# Patient Record
Sex: Female | Born: 1960 | Race: Black or African American | Hispanic: No | Smoking: Never smoker
Health system: Southern US, Community
[De-identification: ages and names within clinical notes are randomized; demographics above are authoritative.]

## PROBLEM LIST (undated history)

## (undated) DIAGNOSIS — T7840XA Allergy, unspecified, initial encounter: Secondary | ICD-10-CM

## (undated) DIAGNOSIS — E119 Type 2 diabetes mellitus without complications: Secondary | ICD-10-CM

## (undated) DIAGNOSIS — E079 Disorder of thyroid, unspecified: Secondary | ICD-10-CM

## (undated) DIAGNOSIS — I10 Essential (primary) hypertension: Secondary | ICD-10-CM

## (undated) DIAGNOSIS — T8859XA Other complications of anesthesia, initial encounter: Secondary | ICD-10-CM

## (undated) DIAGNOSIS — J189 Pneumonia, unspecified organism: Secondary | ICD-10-CM

## (undated) DIAGNOSIS — J45909 Unspecified asthma, uncomplicated: Secondary | ICD-10-CM

## (undated) DIAGNOSIS — T4145XA Adverse effect of unspecified anesthetic, initial encounter: Secondary | ICD-10-CM

## (undated) HISTORY — DX: Essential (primary) hypertension: I10

## (undated) HISTORY — PX: CARPAL TUNNEL RELEASE: SHX101

## (undated) HISTORY — DX: Unspecified asthma, uncomplicated: J45.909

## (undated) HISTORY — DX: Type 2 diabetes mellitus without complications: E11.9

## (undated) HISTORY — DX: Allergy, unspecified, initial encounter: T78.40XA

## (undated) HISTORY — DX: Disorder of thyroid, unspecified: E07.9

---

## 1992-10-17 HISTORY — PX: TUBAL LIGATION: SHX77

## 1996-10-17 HISTORY — PX: ABDOMINAL HYSTERECTOMY: SHX81

## 2012-02-28 DIAGNOSIS — E559 Vitamin D deficiency, unspecified: Secondary | ICD-10-CM | POA: Insufficient documentation

## 2012-02-28 DIAGNOSIS — E782 Mixed hyperlipidemia: Secondary | ICD-10-CM | POA: Insufficient documentation

## 2012-04-07 DIAGNOSIS — C55 Malignant neoplasm of uterus, part unspecified: Secondary | ICD-10-CM | POA: Insufficient documentation

## 2012-08-08 ENCOUNTER — Emergency Department (HOSPITAL_COMMUNITY)
Admission: EM | Admit: 2012-08-08 | Discharge: 2012-08-08 | Disposition: A | Discharge status: Home / Self Care | Payer: BC Managed Care – PPO | Source: Home / Self Care

## 2012-08-08 ENCOUNTER — Encounter (HOSPITAL_COMMUNITY): Payer: Self-pay

## 2012-08-08 DIAGNOSIS — N390 Urinary tract infection, site not specified: Secondary | ICD-10-CM

## 2012-08-08 DIAGNOSIS — R7309 Other abnormal glucose: Secondary | ICD-10-CM

## 2012-08-08 DIAGNOSIS — E119 Type 2 diabetes mellitus without complications: Secondary | ICD-10-CM

## 2012-08-08 DIAGNOSIS — R739 Hyperglycemia, unspecified: Secondary | ICD-10-CM

## 2012-08-08 DIAGNOSIS — R252 Cramp and spasm: Secondary | ICD-10-CM

## 2012-08-08 HISTORY — DX: Essential (primary) hypertension: I10

## 2012-08-08 HISTORY — DX: Type 2 diabetes mellitus without complications: E11.9

## 2012-08-08 LAB — POCT URINALYSIS DIP (DEVICE)
Glucose, UA: 500 mg/dL — AB
Nitrite: NEGATIVE

## 2012-08-08 LAB — POCT I-STAT, CHEM 8
Calcium, Ion: 1.19 mmol/L (ref 1.12–1.23)
Chloride: 100 mEq/L (ref 96–112)
HCT: 46 % (ref 36.0–46.0)
Potassium: 4.2 mEq/L (ref 3.5–5.1)

## 2012-08-08 MED ORDER — CIPROFLOXACIN HCL 250 MG PO TABS: 250.0000 mg | ORAL_TABLET | Freq: Two times a day (BID) | ORAL | Status: DC

## 2012-08-08 NOTE — ED Notes (Signed)
C/o she has been experiencing UA incontinence past few days w minimal cause, and painful leg cramps; NAD; also c/o perineal area itching; denies any STD concerns

## 2012-08-08 NOTE — ED Provider Notes (Signed)
History     CSN: [id number]  Arrival date & time 08/08/12  0915   None     Chief Complaint  Patient presents with  . Urinary Incontinence    (Consider location/radiation/quality/duration/timing/severity/associated sxs/prior treatment) HPI Comments: 51 year old morbidly obese female complains of urinary incontinence. She complains of urinary frequency and urgency for approximately 2 weeks. He states when she voids or bladder there is a large amount of fluid that she believes he and is also overly thirsty. Denies having dribbling urine or small volume voids. She also complains of muscle cramps in her legs primarily at nighttime. States that she had a physical exam in June and everything was normal. She has a history of diabetes and hypertension in which he takes at forming hand hydrochlorothiazide. She does not have a local primary care provider.   Past Medical History  Diagnosis Date  . Diabetes mellitus without complication   . Hypertension     Past Surgical History  Procedure Date  . Abdominal hysterectomy     History reviewed. No pertinent family history.  History  Substance Use Topics  . Smoking status: Not on file  . Smokeless tobacco: Not on file  . Alcohol Use:     OB History    Grav Para Term Preterm Abortions TAB SAB Ect Mult Living                  Review of Systems  Constitutional: Negative for fever, activity change and fatigue.  HENT: Negative.   Eyes: Negative.   Respiratory: Negative for cough, shortness of breath and wheezing.   Cardiovascular: Negative for chest pain and palpitations.  Gastrointestinal: Negative.   Genitourinary: Positive for urgency and frequency. Negative for dysuria, hematuria, vaginal pain and pelvic pain.  Musculoskeletal: Negative.   Skin: Negative for color change, pallor and rash.  Neurological: Negative.     Allergies  Butyl acetate  Home Medications   Current Outpatient Rx  Name Route Sig Dispense Refill  .  HYDROCHLOROTHIAZIDE 12.5 MG PO CAPS Oral Take 12.5 mg by mouth daily.    Marland Kitchen METFORMIN HCL 500 MG PO TABS Oral Take 500 mg by mouth 2 (two) times daily with a meal.    . CIPROFLOXACIN HCL 250 MG PO TABS Oral Take 1 tablet (250 mg total) by mouth 2 (two) times daily. 10 tablet 0    BP 143/87  Pulse 87  Temp 98.3 F (36.8 C) (Oral)  SpO2 97%  Physical Exam  Constitutional: She is oriented to person, place, and time. She appears well-developed and well-nourished.       Morbidly obese  HENT:  Head: Normocephalic and atraumatic.  Eyes: Conjunctivae normal and EOM are normal.  Neck: Neck supple.  Cardiovascular: Normal rate and regular rhythm.   Pulmonary/Chest: Effort normal and breath sounds normal.  Neurological: She is alert and oriented to person, place, and time.  Skin: Skin is warm and dry.  Psychiatric: She has a normal mood and affect.    ED Course  Procedures (including critical care time)  Labs Reviewed  POCT URINALYSIS DIP (DEVICE) - Abnormal; Notable for the following:    Glucose, UA 500 (*)     Bilirubin Urine SMALL (*)     Ketones, ur >=160 (*)     Hgb urine dipstick MODERATE (*)     Protein, ur 100 (*)     Leukocytes, UA TRACE (*)  Biochemical Testing Only. Please order routine urinalysis from main lab if confirmatory testing is needed.  All other components within normal limits  POCT I-STAT, CHEM 8 - Abnormal; Notable for the following:    Glucose, Bld 351 (*)     Hemoglobin 15.6 (*)     All other components within normal limits   No results found.   1. T2DM (type 2 diabetes mellitus)   2. Hyperglycemia   3. Muscle cramps at night   4. UTI (lower urinary tract infection)       MDM   Results for orders placed during the hospital encounter of 08/08/12  POCT URINALYSIS DIP (DEVICE)      Component Value Range   Glucose, UA 500 (*) NEGATIVE mg/dL   Bilirubin Urine SMALL (*) NEGATIVE   Ketones, ur >=160 (*) NEGATIVE mg/dL   Specific Gravity, Urine  1.020  1.005 - 1.030   Hgb urine dipstick MODERATE (*) NEGATIVE   pH 5.5  5.0 - 8.0   Protein, ur 100 (*) NEGATIVE mg/dL   Urobilinogen, UA 0.2  0.0 - 1.0 mg/dL   Nitrite NEGATIVE  NEGATIVE   Leukocytes, UA TRACE (*) NEGATIVE  POCT I-STAT, CHEM 8      Component Value Range   Sodium 136  135 - 145 mEq/L   Potassium 4.2  3.5 - 5.1 mEq/L   Chloride 100  96 - 112 mEq/L   BUN 15  6 - 23 mg/dL   Creatinine, Ser 1.61  0.50 - 1.10 mg/dL   Glucose, Bld 096 (*) 70 - 99 mg/dL   Calcium, Ion 0.45  4.09 - 1.23 mmol/L   TCO2 23  0 - 100 mmol/L   Hemoglobin 15.6 (*) 12.0 - 15.0 g/dL   HCT 81.1  91.4 - 78.2 %    I suspect most of her symptoms are coming from hyperglycemia polyuria and polydipsia. Given the name of the physician to follow up with you should call today for an appointment. She also has a UTI treated with Cipro 250 twice a day for 5 days. Her only treatment for type 2 diabetes mellitus is metformin 500 mg ER one daily. I recommended she take 2 tablets daily. Instructions for checking the blood sugar frequently and hyperglycemia management.       Hayden Rasmussen, NP 08/08/12 1128

## 2012-08-08 NOTE — ED Provider Notes (Signed)
Medical screening examination/treatment/procedure(s) were performed by non-physician practitioner and as supervising physician I was immediately available for consultation/collaboration.  Shalaine Payson   Thedore Pickel, MD 08/08/12 1246 

## 2015-02-25 ENCOUNTER — Emergency Department (HOSPITAL_COMMUNITY)
Admission: EM | Admit: 2015-02-25 | Discharge: 2015-02-25 | Disposition: A | Discharge status: Home / Self Care | Payer: Managed Care, Other (non HMO) | Attending: Emergency Medicine | Admitting: Emergency Medicine

## 2015-02-25 ENCOUNTER — Encounter (HOSPITAL_COMMUNITY): Payer: Self-pay | Admitting: *Deleted

## 2015-02-25 DIAGNOSIS — I10 Essential (primary) hypertension: Secondary | ICD-10-CM | POA: Insufficient documentation

## 2015-02-25 DIAGNOSIS — Z794 Long term (current) use of insulin: Secondary | ICD-10-CM | POA: Diagnosis not present

## 2015-02-25 DIAGNOSIS — H11422 Conjunctival edema, left eye: Secondary | ICD-10-CM

## 2015-02-25 DIAGNOSIS — Z79899 Other long term (current) drug therapy: Secondary | ICD-10-CM | POA: Insufficient documentation

## 2015-02-25 DIAGNOSIS — E119 Type 2 diabetes mellitus without complications: Secondary | ICD-10-CM | POA: Diagnosis not present

## 2015-02-25 DIAGNOSIS — H578 Other specified disorders of eye and adnexa: Secondary | ICD-10-CM | POA: Diagnosis present

## 2015-02-25 MED ORDER — TETRACAINE HCL 0.5 % OP SOLN
2.0000 [drp] | Freq: Once | OPHTHALMIC | Status: AC
  Administered 2015-02-25: 2 [drp] via OPHTHALMIC
  Filled 2015-02-25: qty 2

## 2015-02-25 MED ORDER — KETOTIFEN FUMARATE 0.025 % OP SOLN: 1.0000 [drp] | Freq: Two times a day (BID) | OPHTHALMIC | Status: DC

## 2015-02-25 MED ORDER — GENTAMICIN SULFATE 0.3 % OP SOLN: 2.0000 [drp] | OPHTHALMIC | Status: DC

## 2015-02-25 MED ORDER — FLUORESCEIN SODIUM 1 MG OP STRP
1.0000 | ORAL_STRIP | Freq: Once | OPHTHALMIC | Status: AC
  Administered 2015-02-25: 1 via OPHTHALMIC
  Filled 2015-02-25: qty 1

## 2015-02-25 NOTE — ED Provider Notes (Signed)
CSN: [id number]     Arrival date & time 02/25/15  1646 History  This chart was scribed for a non-physician practitioner, Margarita Mail, PA-C working with Virgel Manifold, MD by Martinique Peace, ED Scribe. The patient was seen in TR04C/TR04C. The patient's care was started at 5:52 PM.    Chief Complaint  Patient presents with  . Eye Problem      The history is provided by the patient. No language interpreter was used.    HPI Comments: Jessica Pace is a 54 y.o. female who presents to the Emergency Department complaining of new onset of left eye irritation/redness x 3 hours with some blurred vision. Pt reports that she felt like she had something in her eye but was not sure what it was so she asked her friend to examine it for her pta. Blister noted to affected eye. 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 She further denies the use of any new mascaras or exposure to anything that may be causing current symptoms. Pt does not wear contacts. Allergies to Butyl Acetate. History of DM and hypertension. Pt is non-smoker.    Past Medical History  Diagnosis Date  . Diabetes mellitus without complication   . Hypertension    Past Surgical History  Procedure Laterality Date  . Abdominal hysterectomy     No family history on file. History  Substance Use Topics  . Smoking status: Never Smoker   . Smokeless tobacco: Not on file  . Alcohol Use: No   OB History    No data available     Review of Systems  Constitutional: Negative for fever and chills.  Eyes: Positive for pain, redness and visual disturbance.  Gastrointestinal: Negative for nausea and vomiting.      Allergies  Butyl acetate  Home Medications   Prior to Admission medications   Medication Sig Start Date End Date Taking? Authorizing Provider  Cholecalciferol (VITAMIN D-3 PO) Take 1 tablet by mouth See admin instructions. Take on sundays and  wednesdays   Yes Historical Provider, MD  insulin glargine (LANTUS) 100 UNIT/ML injection Inject 30 Units into the skin at bedtime.   Yes Historical Provider, MD  losartan (COZAAR) 25 MG tablet Take 25 mg by mouth daily.   Yes Historical Provider, MD  sitaGLIPtin-metformin (JANUMET) 50-1000 MG per tablet Take 1 tablet by mouth daily.   Yes Historical Provider, MD  ciprofloxacin (CIPRO) 250 MG tablet Take 1 tablet (250 mg total) by mouth 2 (two) times daily. Patient not taking: Reported on 02/25/2015 08/08/12   Janne Napoleon, NP   BP 151/75 mmHg  Pulse 80  Temp(Src) 98.2 F (36.8 C) (Oral)  Resp 20  SpO2 98% Physical Exam  Constitutional: She is oriented to person, place, and time. She appears well-developed and well-nourished. No distress.  HENT:  Head: Normocephalic and atraumatic.  Eyes: EOM are normal. Left eye exhibits chemosis.  Eye pressure- Right: 19, Left: 12  Fluorescence test- No uptake  Neck: Neck supple. No tracheal deviation present.  Cardiovascular: Normal rate.   Pulmonary/Chest: Effort normal. No respiratory distress.  Musculoskeletal: Normal range of motion.  Neurological: She is alert and oriented to person, place, and time.  Skin: Skin is warm and dry.  Psychiatric: She has a normal mood and affect. Her behavior is normal.  Nursing note and vitals reviewed.   ED Course  Procedures (including critical care time) Labs Review Labs Reviewed -  No data to display  Imaging Review No results found.   EKG Interpretation None     Medications  tetracaine (PONTOCAINE) 0.5 % ophthalmic solution 2 drop (not administered)  fluorescein ophthalmic strip 1 strip (not administered)     6:04 PM- Treatment plan was discussed with patient who verbalizes understanding and agrees.   MDM   Final diagnoses:  Chemosis of conjunctiva, left    General unilateral chemosis in the lateral portion of the eye. I suspect this is an allergic versus viral. Patient will be discharged  with Garamycin drops and ketotifen. She has no visual disturbances. She appears safe for discharge at this time, follow-up with ophthalmology.  I personally performed the services described in this documentation, which was scribed in my presence. The recorded information has been reviewed and is accurate.      Margarita Mail, PA-C 02/27/15 1632  Virgel Manifold, MD 02/27/15 [phone]

## 2015-02-25 NOTE — ED Notes (Signed)
The pt is c/o redness and irritation in the lt eye fior 3 hours redness and she has a blkister  Also.  nio previous hx.  lmp none

## 2015-02-25 NOTE — Discharge Instructions (Signed)
Conjunctivitis Conjunctivitis is commonly called "pink eye." Conjunctivitis can be caused by bacterial or viral infection, allergies, or injuries. There is usually redness of the lining of the eye, itching, discomfort, and sometimes discharge. There may be deposits of matter along the eyelids. A viral infection usually causes a watery discharge, while a bacterial infection causes a yellowish, thick discharge. Pink eye is very contagious and spreads by direct contact. You may be given antibiotic eyedrops as part of your treatment. Before using your eye medicine, remove all drainage from the eye by washing gently with warm water and cotton balls. Continue to use the medication until you have awakened 2 mornings in a row without discharge from the eye. Do not rub your eye. This increases the irritation and helps spread infection. Use separate towels from other household members. Wash your hands with soap and water before and after touching your eyes. Use cold compresses to reduce pain and sunglasses to relieve irritation from light. Do not wear contact lenses or wear eye makeup until the infection is gone. SEEK MEDICAL CARE IF:  Your symptoms are not better after 3 days of treatment. You have increased pain or trouble seeing. The outer eyelids become very red or swollen. Document Released: 11/10/2004 Document Revised: 12/26/2011 Document Reviewed: 10/03/2005 [facility] Patient Information 2015 [place], [place]. This information is not intended to replace advice given to you by your health care provider. Make sure you discuss any questions you have with your health care provider. Allergic Conjunctivitis The conjunctiva is a thin membrane that covers the visible white part of the eyeball and the underside of the eyelids. This membrane protects and lubricates the eye. The membrane has small blood vessels running through it that can normally be seen. When the conjunctiva becomes inflamed, the condition is called  conjunctivitis. In response to the inflammation, the conjunctival blood vessels become swollen. The swelling results in redness in the normally white part of the eye. The blood vessels of this membrane also react when a person has allergies and is then called allergic conjunctivitis. This condition usually lasts for as long as the allergy persists. Allergic conjunctivitis cannot be passed to another person (non-contagious). The likelihood of bacterial infection is great and the cause is not likely due to allergies if the inflamed eye has:  A sticky discharge.  Discharge or sticking together of the lids in the morning.  Scaling or flaking of the eyelids where the eyelashes come out.  Red swollen eyelids. CAUSES   Viruses.  Irritants such as foreign bodies.  Chemicals.  General allergic reactions.  Inflammation or serious diseases in the inside or the outside of the eye or the orbit (the boney cavity in which the eye sits) can cause a "red eye." SYMPTOMS   Eye redness.  Tearing.  Itchy eyes.  Burning feeling in the eyes.  Clear drainage from the eye.  Allergic reaction due to pollens or ragweed sensitivity. Seasonal allergic conjunctivitis is frequent in the spring when pollens are in the air and in the fall. DIAGNOSIS  This condition, in its many forms, is usually diagnosed based on the history and an ophthalmological exam. It usually involves both eyes. If your eyes react at the same time every year, allergies may be the cause. While most "red eyes" are due to allergy or an infection, the role of an eye (ophthalmological) exam is important. The exam can rule out serious diseases of the eye or orbit. TREATMENT   Non-antibiotic eye drops, ointments, or medications by  mouth may be prescribed if the ophthalmologist is sure the conjunctivitis is due to allergies alone.  Over-the-counter drops and ointments for allergic symptoms should be used only after other causes of  conjunctivitis have been ruled out, or as your caregiver suggests. Medications by mouth are often prescribed if other allergy-related symptoms are present. If the ophthalmologist is sure that the conjunctivitis is due to allergies alone, treatment is normally limited to drops or ointments to reduce itching and burning. HOME CARE INSTRUCTIONS   Wash hands before and after applying drops or ointments, or touching the inflamed eye(s) or eyelids.  Do not let the eye dropper tip or ointment tube touch the eyelid when putting medicine in your eye.  Stop using your soft contact lenses and throw them away. Use a new pair of lenses when recovery is complete. You should run through sterilizing cycles at least three times before use after complete recovery if the old soft contact lenses are to be used. Hard contact lenses should be stopped. They need to be thoroughly sterilized before use after recovery.  Itching and burning eyes due to allergies is often relieved by using a cool cloth applied to closed eye(s). SEEK MEDICAL CARE IF:   Your problems do not go away after two or three days of treatment.  Your lids are sticky (especially in the morning when you wake up) or stick together.  Discharge develops. Antibiotics may be needed either as drops, ointment, or by mouth.  You have extreme light sensitivity.  An oral temperature above 102 F (38.9 C) develops.  Pain in or around the eye or any other visual symptom develops. MAKE SURE YOU:   Understand these instructions.  Will watch your condition.  Will get help right away if you are not doing well or get worse. Document Released: 12/24/2002 Document Revised: 12/26/2011 Document Reviewed: 11/19/2007 [facility] Patient Information 2015 [place], [place]. This information is not intended to replace advice given to you by your health care provider. Make sure you discuss any questions you have with your health care provider.

## 2015-02-26 LAB — CBG MONITORING, ED: Glucose-Capillary: 152 mg/dL — ABNORMAL HIGH (ref 65–99)

## 2015-07-26 ENCOUNTER — Emergency Department (HOSPITAL_COMMUNITY): Payer: Managed Care, Other (non HMO)

## 2015-07-26 ENCOUNTER — Emergency Department (HOSPITAL_COMMUNITY)
Admission: EM | Admit: 2015-07-26 | Discharge: 2015-07-27 | Disposition: A | Discharge status: Home / Self Care | Payer: Managed Care, Other (non HMO) | Attending: Emergency Medicine | Admitting: Emergency Medicine

## 2015-07-26 ENCOUNTER — Encounter (HOSPITAL_COMMUNITY): Payer: Self-pay | Admitting: Emergency Medicine

## 2015-07-26 DIAGNOSIS — E1165 Type 2 diabetes mellitus with hyperglycemia: Secondary | ICD-10-CM | POA: Diagnosis not present

## 2015-07-26 DIAGNOSIS — Z79899 Other long term (current) drug therapy: Secondary | ICD-10-CM | POA: Insufficient documentation

## 2015-07-26 DIAGNOSIS — I10 Essential (primary) hypertension: Secondary | ICD-10-CM | POA: Insufficient documentation

## 2015-07-26 DIAGNOSIS — R739 Hyperglycemia, unspecified: Secondary | ICD-10-CM

## 2015-07-26 LAB — BASIC METABOLIC PANEL
ANION GAP: 9 (ref 5–15)
BUN: 12 mg/dL (ref 6–20)
CALCIUM: 9.1 mg/dL (ref 8.9–10.3)
CO2: 24 mmol/L (ref 22–32)
CREATININE: 0.69 mg/dL (ref 0.44–1.00)
Chloride: 97 mmol/L — ABNORMAL LOW (ref 101–111)
GLUCOSE: 327 mg/dL — AB (ref 65–99)
Potassium: 4 mmol/L (ref 3.5–5.1)
Sodium: 130 mmol/L — ABNORMAL LOW (ref 135–145)

## 2015-07-26 LAB — CBG MONITORING, ED
GLUCOSE-CAPILLARY: 352 mg/dL — AB (ref 65–99)
GLUCOSE-CAPILLARY: 278 mg/dL — AB (ref 65–99)

## 2015-07-26 LAB — URINE MICROSCOPIC-ADD ON

## 2015-07-26 LAB — URINALYSIS, ROUTINE W REFLEX MICROSCOPIC
BILIRUBIN URINE: NEGATIVE
KETONES UR: NEGATIVE mg/dL
Nitrite: NEGATIVE
PROTEIN: NEGATIVE mg/dL
Specific Gravity, Urine: >1.03 — ABNORMAL HIGH (ref 1.005–1.030)
UROBILINOGEN UA: 0.2 mg/dL (ref 0.0–1.0)
pH: 5.5 (ref 5.0–8.0)

## 2015-07-26 LAB — CBC
HCT: 34.1 % — ABNORMAL LOW (ref 36.0–46.0)
HEMOGLOBIN: 11.7 g/dL — AB (ref 12.0–15.0)
MCH: 28.3 pg (ref 26.0–34.0)
MCHC: 34.3 g/dL (ref 30.0–36.0)
MCV: 82.6 fL (ref 78.0–100.0)
PLATELETS: 184 10*3/uL (ref 150–400)
RBC: 4.13 MIL/uL (ref 3.87–5.11)
RDW: 15.2 % (ref 11.5–15.5)
WBC: 5.8 10*3/uL (ref 4.0–10.5)

## 2015-07-26 MED ORDER — SODIUM CHLORIDE 0.9 % IV BOLUS (SEPSIS)
1000.0000 mL | Freq: Once | INTRAVENOUS | Status: AC
  Administered 2015-07-26: 1000 mL via INTRAVENOUS

## 2015-07-26 MED ORDER — FLUCONAZOLE 100 MG PO TABS
150.0000 mg | ORAL_TABLET | Freq: Once | ORAL | Status: AC
  Administered 2015-07-26: 150 mg via ORAL
  Filled 2015-07-26: qty 2

## 2015-07-26 NOTE — ED Provider Notes (Signed)
CSN: [id number]     Arrival date & time 07/26/15  2011 History   First MD Initiated Contact with Patient 07/26/15 2118     Chief Complaint  Patient presents with  . Hyperglycemia     (Consider location/radiation/quality/duration/timing/severity/associated sxs/prior Treatment) HPI Comments: The pt is a diabetic - she has recently been treated for bronchitis with doxycycline, has been using metformin for the last 2 months and has generally had good blood sugar control around 130 in the morning. She denies fevers chills, cough has improved, has urinary frequency and polydipsia. She denies ongoing diarrhea or blood in the stools but has had some loose stools last week. She has no abdominal tenderness, no swelling, no rashes, no headaches. The patient reports elevated blood sugars over the last 2 days, 300, today over 400, now associated generalized fatigue, is feeling lightheaded and dizzy but has no shortness of breath. The symptoms are persistent, gradually worsening. She does not take insulin. She does report some vaginal itching  Patient is a 54 y.o. female presenting with hyperglycemia. The history is provided by the patient.  Hyperglycemia   Past Medical History  Diagnosis Date  . Diabetes mellitus without complication ([place])   . Hypertension    Past Surgical History  Procedure Laterality Date  . Abdominal hysterectomy     No family history on file. Social History  Substance Use Topics  . Smoking status: Never Smoker   . Smokeless tobacco: None  . Alcohol Use: No   OB History    No data available     Review of Systems  All other systems reviewed and are negative.     Allergies  Butyl acetate  Home Medications   Prior to Admission medications   Medication Sig Start Date End Date Taking? Authorizing Provider  Cholecalciferol (VITAMIN D-3 PO) Take 1 tablet by mouth See admin instructions. Take on sundays and wednesdays   Yes Historical Provider, MD  ergocalciferol  (VITAMIN D2) 50000 UNITS capsule Take 50,000 Units by mouth See admin instructions. Take one capsule by mouth twice a week on sundays and thursdays   Yes Historical Provider, MD  losartan (COZAAR) 25 MG tablet Take 25 mg by mouth daily.   Yes Historical Provider, MD  metFORMIN (GLUMETZA) 500 MG (MOD) 24 hr tablet Take 1,000 mg by mouth daily with breakfast.   Yes Historical Provider, MD  ciprofloxacin (CIPRO) 250 MG tablet Take 1 tablet (250 mg total) by mouth 2 (two) times daily. Patient not taking: Reported on 02/25/2015 08/08/12   Janne Napoleon, NP  gentamicin (GARAMYCIN) 0.3 % ophthalmic solution Place 2 drops into the left eye every 4 (four) hours. Patient not taking: Reported on 07/26/2015 02/25/15   Margarita Mail, PA-C  ketotifen (ZADITOR) 0.025 % ophthalmic solution Place 1 drop into the left eye 2 (two) times daily. Patient not taking: Reported on 07/26/2015 02/25/15   Margarita Mail, PA-C   BP 116/54 mmHg  Pulse 67  Temp(Src) 98.8 F (37.1 C) (Oral)  Resp 16  Ht 5\' 4"  (1.626 m)  Wt 222 lb 12.8 oz (101.061 kg)  BMI 38.22 kg/m2  SpO2 97% Physical Exam  Constitutional: She appears well-developed and well-nourished. No distress.  HENT:  Head: Normocephalic and atraumatic.  Mouth/Throat: Oropharynx is clear and moist. No oropharyngeal exudate.  Eyes: Conjunctivae and EOM are normal. Pupils are equal, round, and reactive to light. Right eye exhibits no discharge. Left eye exhibits no discharge. No scleral icterus.  Neck: Normal range of motion. Neck supple.  No JVD present. No thyromegaly present.  Cardiovascular: Normal rate, regular rhythm, normal heart sounds and intact distal pulses.  Exam reveals no gallop and no friction rub.   No murmur heard. Pulmonary/Chest: Effort normal and breath sounds normal. No respiratory distress. She has no wheezes. She has no rales.  Abdominal: Soft. Bowel sounds are normal. She exhibits no distension and no mass. There is no tenderness.  Musculoskeletal:  Normal range of motion. She exhibits no edema or tenderness.  Lymphadenopathy:    She has no cervical adenopathy.  Neurological: She is alert. Coordination normal.  Skin: Skin is warm and dry. No rash noted. No erythema.  Psychiatric: She has a normal mood and affect. Her behavior is normal.  Nursing note and vitals reviewed.   ED Course  Procedures (including critical care time) Labs Review Labs Reviewed  BASIC METABOLIC PANEL - Abnormal; Notable for the following:    Sodium 130 (*)    Chloride 97 (*)    Glucose, Bld 327 (*)    All other components within normal limits  CBC - Abnormal; Notable for the following:    Hemoglobin 11.7 (*)    HCT 34.1 (*)    All other components within normal limits  URINALYSIS, ROUTINE W REFLEX MICROSCOPIC (NOT AT [facility]) - Abnormal; Notable for the following:    APPearance CLOUDY (*)    Specific Gravity, Urine >1.030 (*)    Glucose, UA >1000 (*)    Hgb urine dipstick TRACE (*)    Leukocytes, UA TRACE (*)    All other components within normal limits  CBG MONITORING, ED - Abnormal; Notable for the following:    Glucose-Capillary 352 (*)    All other components within normal limits  CBG MONITORING, ED - Abnormal; Notable for the following:    Glucose-Capillary 278 (*)    All other components within normal limits  URINE MICROSCOPIC-ADD ON    Imaging Review Dg Chest 2 View  07/26/2015   CLINICAL DATA:  54 year old female with hyperglycemia, dizziness, weakness. Initial encounter.  EXAM: CHEST  2 VIEW  COMPARISON:  None.  FINDINGS: Large body habitus. Low lung volumes. Normal cardiac size and mediastinal contours. Visualized tracheal air column is within normal limits. No pneumothorax, pulmonary edema, pleural effusion or confluent pulmonary opacity. No acute osseous abnormality identified.  IMPRESSION: Low lung volumes, otherwise no acute cardiopulmonary abnormality.   Electronically Signed   By: Genevie Ann M.D.   On: 07/26/2015 22:46   I have  personally reviewed and evaluated these images and lab results as part of my medical decision-making.   MDM   Final diagnoses:  Hyperglycemia    Vital signs are unremarkable, urinalysis shows no ketones, she does have a high specific gravity consistent with being dehydrated. We will give IV fluids, rule out pneumonia, she does describe vaginal itching which could be consistent with yeast infection since her blood sugar is so high and this happens to her when she gets high blood sugar.  No DKA - pt informed of results - she is stable for d/c.  Meds given in ED:  Medications  sodium chloride 0.9 % bolus 1,000 mL (0 mLs Intravenous Stopped 07/26/15 2244)  sodium chloride 0.9 % bolus 1,000 mL (1,000 mLs Intravenous New Bag/Given 07/26/15 2252)  fluconazole (DIFLUCAN) tablet 150 mg (150 mg Oral Given 07/26/15 2252)    New Prescriptions   No medications on file      Noemi Chapel, MD 07/26/15 2336

## 2015-07-26 NOTE — ED Notes (Signed)
Pt. reports elevated blood sugar this evening = 421 with  mild dizziness and generalized weakness/fatigue .

## 2015-07-26 NOTE — ED Notes (Signed)
Pt placed in a gown and hooked up to the monitor with the BP cuff and pulse ox 

## 2015-07-26 NOTE — Discharge Instructions (Signed)
You will likely need further medication management of your elevated blood sugar - through hydration we have been able to get the blood sugar down into a reasonable range - you MUST keep to a strict diabetic diet  Return to the ER for worsening symptoms.  Please obtain all of your results from medical records or have your doctors office obtain the results - share them with your doctor - you should be seen at your doctors office in the next 2 days. Call today to arrange your follow up. Take the medications as prescribed. Please review all of the medicines and only take them if you do not have an allergy to them. Please be aware that if you are taking birth control pills, taking other prescriptions, ESPECIALLY ANTIBIOTICS may make the birth control ineffective - if this is the case, either do not engage in sexual activity or use alternative methods of birth control such as condoms until you have finished the medicine and your family doctor says it is OK to restart them. If you are on a blood thinner such as COUMADIN, be aware that any other medicine that you take may cause the coumadin to either work too much, or not enough - you should have your coumadin level rechecked in next 7 days if this is the case.  ?  It is also a possibility that you have an allergic reaction to any of the medicines that you have been prescribed - Everybody reacts differently to medications and while MOST people have no trouble with most medicines, you may have a reaction such as nausea, vomiting, rash, swelling, shortness of breath. If this is the case, please stop taking the medicine immediately and contact your physician.  ?  You should return to the ER if you develop severe or worsening symptoms.

## 2015-07-26 NOTE — ED Notes (Signed)
Pt CBG result was 278. Informed RN.

## 2015-07-27 MED ORDER — ACETAMINOPHEN 500 MG PO TABS
1000.0000 mg | ORAL_TABLET | Freq: Once | ORAL | Status: AC
Start: 2015-07-27 — End: 2015-07-27
  Administered 2015-07-27: 1000 mg via ORAL
  Filled 2015-07-27: qty 2

## 2015-07-27 NOTE — ED Notes (Signed)
Pt A&Ox4, ambulatory at d/c with steady gait but wheeled out of ED via wheelchair, NAD and pt states she has all of her belongings with her at discharge.

## 2015-07-28 ENCOUNTER — Telehealth: Payer: Self-pay | Admitting: Family Medicine

## 2015-07-28 ENCOUNTER — Ambulatory Visit (INDEPENDENT_AMBULATORY_CARE_PROVIDER_SITE_OTHER): Payer: Managed Care, Other (non HMO) | Admitting: Family Medicine

## 2015-07-28 ENCOUNTER — Encounter: Payer: Self-pay | Admitting: Family Medicine

## 2015-07-28 VITALS — BP 128/74 | HR 68 | Ht 64.5 in | Wt 219.2 lb

## 2015-07-28 DIAGNOSIS — E669 Obesity, unspecified: Secondary | ICD-10-CM

## 2015-07-28 DIAGNOSIS — Z1239 Encounter for other screening for malignant neoplasm of breast: Secondary | ICD-10-CM

## 2015-07-28 DIAGNOSIS — Z7189 Other specified counseling: Secondary | ICD-10-CM | POA: Diagnosis not present

## 2015-07-28 DIAGNOSIS — Z7689 Persons encountering health services in other specified circumstances: Secondary | ICD-10-CM

## 2015-07-28 DIAGNOSIS — E119 Type 2 diabetes mellitus without complications: Secondary | ICD-10-CM

## 2015-07-28 DIAGNOSIS — I10 Essential (primary) hypertension: Secondary | ICD-10-CM

## 2015-07-28 DIAGNOSIS — Z23 Encounter for immunization: Secondary | ICD-10-CM | POA: Diagnosis not present

## 2015-07-28 DIAGNOSIS — E1169 Type 2 diabetes mellitus with other specified complication: Secondary | ICD-10-CM

## 2015-07-28 MED ORDER — DULAGLUTIDE 0.75 MG/0.5ML ~~LOC~~ SOAJ: 1.0000 "application " | SUBCUTANEOUS | Status: DC

## 2015-07-28 MED ORDER — METFORMIN HCL ER 500 MG PO TB24: 500.0000 mg | ORAL_TABLET | Freq: Two times a day (BID) | ORAL | Status: DC

## 2015-07-28 NOTE — Progress Notes (Signed)
Subjective:    Patient ID: Jessica Pace, female    DOB: 1960-10-28, 53 y.o.   MRN: [id number]  HPI She is new to the practice and here to establish primary care. She states she has been unhappy with her previous primary care office and decided to switch to [place]. She also reports having to go to the emergency department a few days ago for elevated blood sugar.  States she was diagnosed with diabetes type 2 in 2013. Reports A1C then was over 11 then and has been in the 6 range however she cannot recall when her last A1C was.  Was taking Janumet in past but her previous provider switched her to Metformin due to insurance issues. She states she has tried a class of medication for her diabetes in past that made her urinate a lot and caused her to have yeast infections. She is walking a lot at work and drinking plenty of water.  History of high blood pressure and is taking her medication daily for this.  She would like to see a nutritionist, has never seen one.   she denies fever, chills, fatigue, nausea, vomiting , diarrhea, chest pain, DOE.   She is taking Metformin 500mg  in morning and in evening.  Has not been checking blood sugars on regular basis.   Saw eye doctor 2 weeks ago. Wears glasses.  Feet- no problems  Needs mammogram, last one 2 years ago and normal.  Colonoscopy 2009 and normal per patient.   Need records  Including immunizations.    Review of Systems Pertinent positives and negatives in the history of present illness.    Objective:   Physical Exam  Constitutional: She is oriented to person, place, and time. She appears well-developed and well-nourished. No distress.  Neurological: She is alert and oriented to person, place, and time. Coordination normal.  Skin: Skin is warm and dry. No pallor.  Psychiatric: She has a normal mood and affect. Her behavior is normal. Judgment and thought content normal.   BP 128/74 mmHg  Pulse 68  Ht 5' 4.5" (1.638 m)  Wt 219 lb 3.2 oz  (99.428 kg)  BMI 37.06 kg/m2   hemoglobin A1c 8.7     Assessment & Plan:  Encounter to establish care  Diabetes mellitus type 2 in obese ([place]) - Plan: Amb ref to Medical Nutrition Therapy-MNT  Screening for breast cancer - Plan: MM DIGITAL SCREENING BILATERAL  Need for influenza vaccination - Plan: Flu Vaccine QUAD 36+ mos IM  Obesity (BMI 30-39.9) - Plan: Amb ref to Medical Nutrition Therapy-MNT  Essential hypertension  Discussed diabetes and the importance of checking her blood sugar and good medication compliance. Also discussed exercise and recommended 150 minutes of some sort of physical activity per week along with portion control and carb counting. Since she has never seen a nutritionist or diabetes educator I will refer her for this. Her A1c is 8.7 today and she will need a change to her medication regimen. Change Metformin 500 to extended release BID. Will start her on weekly injection Trulicity, she is ok with weekly injections. Also discussed possibility of weight loss associated with medication.  Discussed that I would like for her to bring a diary of her watch sugars, she is to check them daily before breakfast and 2 hours post meal at some point throughout the day.  Hypertension- doing well on current medication, no changes.  Order placed for screening mammogram. Flu shot given today A minimum of 45 minutes spent  in counseling and coordination of care.

## 2015-07-28 NOTE — Patient Instructions (Signed)
Check your blood sugar daily in the morning before breakfast. Then check it 2 hours after a meal. Keep a diary of your readings.    Basic Carbohydrate Counting for Diabetes Mellitus Carbohydrate counting is a method for keeping track of the amount of carbohydrates you eat. Eating carbohydrates naturally increases the level of sugar (glucose) in your blood, so it is important for you to know the amount that is okay for you to have in every meal. Carbohydrate counting helps keep the level of glucose in your blood within normal limits. The amount of carbohydrates allowed is different for every person. A dietitian can help you calculate the amount that is right for you. Once you know the amount of carbohydrates you can have, you can count the carbohydrates in the foods you want to eat. Carbohydrates are found in the following foods:  Grains, such as breads and cereals.  Dried beans and soy products.  Starchy vegetables, such as potatoes, peas, and corn.  Fruit and fruit juices.  Milk and yogurt.  Sweets and snack foods, such as cake, cookies, candy, chips, soft drinks, and fruit drinks. CARBOHYDRATE COUNTING There are two ways to count the carbohydrates in your food. You can use either of the methods or a combination of both. Reading the "Nutrition Facts" on [place] The "Nutrition Facts" is an area that is included on the labels of almost all packaged food and beverages in the [place]. It includes the serving size of that food or beverage and information about the nutrients in each serving of the food, including the grams (g) of carbohydrate per serving.  Decide the number of servings of this food or beverage that you will be able to eat or drink. Multiply that number of servings by the number of grams of carbohydrate that is listed on the label for that serving. The total will be the amount of carbohydrates you will be having when you eat or drink this food or beverage. Learning  Standard Serving Sizes of Food When you eat food that is not packaged or does not include "Nutrition Facts" on the label, you need to measure the servings in order to count the amount of carbohydrates.A serving of most carbohydrate-rich foods contains about 15 g of carbohydrates. The following list includes serving sizes of carbohydrate-rich foods that provide 15 g ofcarbohydrate per serving:   1 slice of bread (1 oz) or 1 six-inch tortilla.    of a hamburger bun or English muffin.  4-6 crackers.   cup unsweetened dry cereal.    cup hot cereal.   cup rice or pasta.    cup mashed potatoes or  of a large baked potato.  1 cup fresh fruit or one small piece of fruit.    cup canned or frozen fruit or fruit juice.  1 cup milk.   cup plain fat-free yogurt or yogurt sweetened with artificial sweeteners.   cup cooked dried beans or starchy vegetable, such as peas, corn, or potatoes.  Decide the number of standard-size servings that you will eat. Multiply that number of servings by 15 (the grams of carbohydrates in that serving). For example, if you eat 2 cups of strawberries, you will have eaten 2 servings and 30 g of carbohydrates (2 servings x 15 g = 30 g). For foods such as soups and casseroles, in which more than one food is mixed in, you will need to count the carbohydrates in each food that is included. EXAMPLE OF CARBOHYDRATE COUNTING Sample  Dinner  3 oz chicken breast.   cup of brown rice.   cup of corn.  1 cup milk.   1 cup strawberries with sugar-free whipped topping.  Carbohydrate Calculation Step 1: Identify the foods that contain carbohydrates:   Rice.   Corn.   Milk.   Strawberries. Step 2:Calculate the number of servings eaten of each:   2 servings of rice.   1 serving of corn.   1 serving of milk.   1 serving of strawberries. Step 3: Multiply each of those number of servings by 15 g:   2 servings of rice x 15 g = 30 g.    1 serving of corn x 15 g = 15 g.   1 serving of milk x 15 g = 15 g.   1 serving of strawberries x 15 g = 15 g. Step 4: Add together all of the amounts to find the total grams of carbohydrates eaten: 30 g + 15 g + 15 g + 15 g = 75 g.   This information is not intended to replace advice given to you by your health care provider. Make sure you discuss any questions you have with your health care provider.   Document Released: 10/03/2005 Document Revised: 10/24/2014 Document Reviewed: 08/30/2013 Elsevier Interactive Patient Education [employer].

## 2015-07-28 NOTE — Telephone Encounter (Signed)
Pt was notified of taking meds and a saving card and info about weekly shot will be left upfront for her to come pick up. She has never had pancreatitis nor family history of it.

## 2015-07-28 NOTE — Telephone Encounter (Signed)
Please call and tell her that I called in medications to her pharmacy for her diabetes. I am switching her from her current Metformin to an extended release Metformin twice daily. Also, I am prescribing a once weekly injection called Trulicity. See if she can come by the office and pick up the brochure on this medication and the coupon for it. She should try to take the medicine at the same time each week and preferably in the evening.   **Also, please ask her if she has ever had pancreatitis or if she has a family history of it. If so, let me know and I will switch her to a different medication.  Let her know she may have some nausea after starting the medication but this should improve over time. If not, let me know.

## 2015-07-29 ENCOUNTER — Telehealth: Payer: Self-pay

## 2015-07-29 NOTE — Telephone Encounter (Signed)
Medical Records received on 07/29/15

## 2015-08-06 ENCOUNTER — Ambulatory Visit (INDEPENDENT_AMBULATORY_CARE_PROVIDER_SITE_OTHER): Payer: Managed Care, Other (non HMO) | Admitting: Family Medicine

## 2015-08-06 ENCOUNTER — Telehealth: Payer: Self-pay | Admitting: Family Medicine

## 2015-08-06 ENCOUNTER — Encounter: Payer: Self-pay | Admitting: Family Medicine

## 2015-08-06 VITALS — BP 124/78 | HR 68 | Wt 219.0 lb

## 2015-08-06 DIAGNOSIS — E669 Obesity, unspecified: Secondary | ICD-10-CM

## 2015-08-06 DIAGNOSIS — E119 Type 2 diabetes mellitus without complications: Secondary | ICD-10-CM

## 2015-08-06 DIAGNOSIS — E1169 Type 2 diabetes mellitus with other specified complication: Secondary | ICD-10-CM

## 2015-08-06 LAB — POCT GLYCOSYLATED HEMOGLOBIN (HGB A1C)

## 2015-08-06 LAB — GLUCOSE, POCT (MANUAL RESULT ENTRY): POC GLUCOSE: 265 mg/dL — AB (ref 70–99)

## 2015-08-06 MED ORDER — INSULIN GLARGINE 100 UNIT/ML ~~LOC~~ SOLN: 10.0000 [IU] | Freq: Every day | SUBCUTANEOUS | Status: DC

## 2015-08-06 NOTE — Progress Notes (Signed)
   Subjective:    Patient ID: Mardelle Pandolfi, female    DOB: [birth year], 54 y.o.   MRN: [id number]  HPI She is here for complaints of elevated blood sugar in the 300s and also reports blurred vision. Denies headache, dizziness, polyuria, polyphagia, polydipsia. She states she is wearing her old eyeglasses and this could play a role in vision seeming blurry.  States fasting cbg this morning was 334. States she is eating healthy and trying to eat low carbs and has been walking some.  Has appointment with nutritionist November 3rd.  Reports she is taking Metformin bid and Trulicity every Wednesday evening and that yesterday was her 2nd dose of this medication. She is concerned that her blood sugars are still high.     Review of Systems Pertinent positives and negatives in the HPI.     Objective:   Physical Exam  Alert and oriented and in no acute distress.   Visual acuity normal.  Blood glucose finger stick 265, patient's glucometer was 253.     Assessment & Plan:  Diabetes mellitus type 2 in obese ([place]) - Plan: Visual acuity screening, POCT glucose (manual entry), insulin glargine (LANTUS) 100 UNIT/ML injection  Discussed that she should continue current daily bid metformin and  Weekly Trulicity injection. Will add Lantus 10 units nightly and she will continue to check her blood sugar every morning and once later in the day 2 hours after a meal. Also discussed that if she is going to exercise that she should check her sugar before and if less than 100 she should have a small snack prior to exercise. Advised her to call the office on Monday, 4 days from now, and let me know what her blood sugars are and how she is doing. She states she will go to Nutritionist appointment.

## 2015-08-06 NOTE — Telephone Encounter (Signed)
Pt coming in today to bee seen

## 2015-08-06 NOTE — Telephone Encounter (Signed)
Left message for pt to call me back 

## 2015-08-06 NOTE — Telephone Encounter (Signed)
Please call and ask her how exactly is she taking her medication and when? How many doses has she taken so far of Trulicity injection?  Ask her to come by for a nurse visit and to bring her meter and compare it with ours and then she will need to bring her Trulicity pen and demonstrate to you that she is using it properly.  We added medication to what she was on so I expect to see her numbers going down.

## 2015-08-06 NOTE — Telephone Encounter (Signed)
Pt says that blood sugars have been in the 300's and pt is concerned. She states that meds were changed and blood sugars have been like this since then. Last night at bed time it was 343, this morning 334, yesterday morning 365

## 2015-08-06 NOTE — Addendum Note (Signed)
Addended by: Minette Headland A on: 08/06/2015 02:10 PM   Modules accepted: Orders

## 2015-08-10 ENCOUNTER — Encounter: Payer: Self-pay | Admitting: Family Medicine

## 2015-08-10 ENCOUNTER — Telehealth: Payer: Self-pay | Admitting: Internal Medicine

## 2015-08-10 DIAGNOSIS — E1165 Type 2 diabetes mellitus with hyperglycemia: Secondary | ICD-10-CM | POA: Insufficient documentation

## 2015-08-10 DIAGNOSIS — Z8639 Personal history of other endocrine, nutritional and metabolic disease: Secondary | ICD-10-CM | POA: Insufficient documentation

## 2015-08-10 DIAGNOSIS — IMO0002 Reserved for concepts with insufficient information to code with codable children: Secondary | ICD-10-CM | POA: Insufficient documentation

## 2015-08-10 DIAGNOSIS — E669 Obesity, unspecified: Secondary | ICD-10-CM | POA: Insufficient documentation

## 2015-08-10 DIAGNOSIS — E1169 Type 2 diabetes mellitus with other specified complication: Secondary | ICD-10-CM

## 2015-08-10 DIAGNOSIS — Z87898 Personal history of other specified conditions: Secondary | ICD-10-CM | POA: Insufficient documentation

## 2015-08-10 DIAGNOSIS — E785 Hyperlipidemia, unspecified: Secondary | ICD-10-CM

## 2015-08-10 DIAGNOSIS — I10 Essential (primary) hypertension: Secondary | ICD-10-CM | POA: Insufficient documentation

## 2015-08-10 MED ORDER — INSULIN GLARGINE 100 UNIT/ML ~~LOC~~ SOLN: SUBCUTANEOUS | Status: DC

## 2015-08-10 MED ORDER — METFORMIN HCL ER (MOD) 1000 MG PO TB24: 1000.0000 mg | ORAL_TABLET | Freq: Two times a day (BID) | ORAL | Status: DC

## 2015-08-10 NOTE — Telephone Encounter (Signed)
Pt was notified and will call us back on Friday to let us know what her readings are

## 2015-08-10 NOTE — Telephone Encounter (Signed)
Pt called states what her blood sugars are over the weekend.   Thursday night @ 294. Took 10 units of insulin  Friday Am- 288           [place] Before dinner- 318. Took 10 units of insulin before bed  Saturday Am-296 Before Dinner 336  (had veggie omelette, salad and had some meatballs)  took 10 units before bed  Sunday am 279 (had eggs and kiwi) Before dinner- 342 (rotessari chicken and turip greens and few green peas)  took 10 units before bed  Monday am- 266

## 2015-08-10 NOTE — Telephone Encounter (Signed)
Please call and let her know that she is doing the right things but this may take a little more time to get her blood sugar down. Is she having any symptoms?  Tell her to increase her Metformin dose to 1000 mg twice daily and I will send a prescription in to her pharmacy but she can take the medication she has at home until it runs out.  Also, tell her to increase her Lantus to 12 units instead of 10 for the next two nights and if her blood sugar is not below 120 then she can increase it to 14 units on Wednesday night and Thursday night. Have her call call me on Friday and let me know what her numbers are from the week.  Samples given last Thursday 08/06/2015 at her last visit.

## 2015-08-18 ENCOUNTER — Telehealth: Payer: Self-pay

## 2015-08-18 ENCOUNTER — Ambulatory Visit: Payer: Managed Care, Other (non HMO)

## 2015-08-18 NOTE — Telephone Encounter (Signed)
Medical Records received on 08/18/15

## 2015-08-20 ENCOUNTER — Ambulatory Visit: Payer: Managed Care, Other (non HMO) | Admitting: *Deleted

## 2015-08-25 ENCOUNTER — Ambulatory Visit: Payer: Managed Care, Other (non HMO)

## 2015-09-01 ENCOUNTER — Ambulatory Visit: Payer: Managed Care, Other (non HMO)

## 2015-09-01 ENCOUNTER — Encounter: Payer: Self-pay | Admitting: Family Medicine

## 2015-09-01 ENCOUNTER — Ambulatory Visit (INDEPENDENT_AMBULATORY_CARE_PROVIDER_SITE_OTHER): Payer: Managed Care, Other (non HMO) | Admitting: Family Medicine

## 2015-09-01 VITALS — BP 128/80 | HR 68 | Wt 218.2 lb

## 2015-09-01 DIAGNOSIS — E785 Hyperlipidemia, unspecified: Secondary | ICD-10-CM

## 2015-09-01 DIAGNOSIS — IMO0001 Reserved for inherently not codable concepts without codable children: Secondary | ICD-10-CM

## 2015-09-01 DIAGNOSIS — E669 Obesity, unspecified: Secondary | ICD-10-CM | POA: Diagnosis not present

## 2015-09-01 DIAGNOSIS — Z794 Long term (current) use of insulin: Secondary | ICD-10-CM | POA: Diagnosis not present

## 2015-09-01 DIAGNOSIS — E1165 Type 2 diabetes mellitus with hyperglycemia: Secondary | ICD-10-CM | POA: Diagnosis not present

## 2015-09-01 DIAGNOSIS — I10 Essential (primary) hypertension: Secondary | ICD-10-CM | POA: Diagnosis not present

## 2015-09-01 DIAGNOSIS — Z8639 Personal history of other endocrine, nutritional and metabolic disease: Secondary | ICD-10-CM | POA: Insufficient documentation

## 2015-09-01 DIAGNOSIS — E1169 Type 2 diabetes mellitus with other specified complication: Secondary | ICD-10-CM

## 2015-09-01 LAB — POCT URINALYSIS DIPSTICK
Bilirubin, UA: NEGATIVE
Blood, UA: NEGATIVE
Glucose, UA: NEGATIVE
Ketones, UA: NEGATIVE
LEUKOCYTES UA: NEGATIVE
Nitrite, UA: NEGATIVE
PROTEIN UA: NEGATIVE
Spec Grav, UA: >=1.03
UROBILINOGEN UA: NEGATIVE
pH, UA: 5.5

## 2015-09-01 LAB — COMPREHENSIVE METABOLIC PANEL
ALBUMIN: 4.3 g/dL (ref 3.6–5.1)
ALT: 31 U/L — ABNORMAL HIGH (ref 6–29)
AST: 21 U/L (ref 10–35)
Alkaline Phosphatase: 66 U/L (ref 33–130)
BUN: 14 mg/dL (ref 7–25)
CHLORIDE: 104 mmol/L (ref 98–110)
CO2: 26 mmol/L (ref 20–31)
Calcium: 9.3 mg/dL (ref 8.6–10.4)
Creat: 0.67 mg/dL (ref 0.50–1.05)
Glucose, Bld: 100 mg/dL — ABNORMAL HIGH (ref 65–99)
POTASSIUM: 4.4 mmol/L (ref 3.5–5.3)
Sodium: 139 mmol/L (ref 135–146)
TOTAL PROTEIN: 7.3 g/dL (ref 6.1–8.1)
Total Bilirubin: 0.6 mg/dL (ref 0.2–1.2)

## 2015-09-01 LAB — CBC WITH DIFFERENTIAL/PLATELET
Basophils Absolute: 0.1 10*3/uL (ref 0.0–0.1)
Basophils Relative: 1 % (ref 0–1)
EOS ABS: 0.1 10*3/uL (ref 0.0–0.7)
EOS PCT: 2 % (ref 0–5)
HCT: 36.6 % (ref 36.0–46.0)
HEMOGLOBIN: 12.1 g/dL (ref 12.0–15.0)
LYMPHS ABS: 2.2 10*3/uL (ref 0.7–4.0)
Lymphocytes Relative: 40 % (ref 12–46)
MCH: 27.8 pg (ref 26.0–34.0)
MCHC: 33.1 g/dL (ref 30.0–36.0)
MCV: 83.9 fL (ref 78.0–100.0)
MONOS PCT: 7 % (ref 3–12)
MPV: 10.9 fL (ref 8.6–12.4)
Monocytes Absolute: 0.4 10*3/uL (ref 0.1–1.0)
Neutro Abs: 2.8 10*3/uL (ref 1.7–7.7)
Neutrophils Relative %: 50 % (ref 43–77)
PLATELETS: 225 10*3/uL (ref 150–400)
RBC: 4.36 MIL/uL (ref 3.87–5.11)
RDW: 16.2 % — ABNORMAL HIGH (ref 11.5–15.5)
WBC: 5.6 10*3/uL (ref 4.0–10.5)

## 2015-09-01 LAB — LIPID PANEL
Cholesterol: 137 mg/dL (ref 125–200)
HDL: 32 mg/dL — AB (ref >=46)
LDL CALC: 79 mg/dL (ref <130)
Total CHOL/HDL Ratio: 4.3 Ratio (ref <=5.0)
Triglycerides: 129 mg/dL (ref <150)
VLDL: 26 mg/dL (ref <30)

## 2015-09-01 LAB — POCT UA - MICROALBUMIN
ALBUMIN/CREATININE RATIO, URINE, POC: 25.9
CREATININE, POC: 162.7 mg/dL
MICROALBUMIN (UR) POC: 42.1 mg/L

## 2015-09-01 LAB — TSH: TSH: 3.566 u[IU]/mL (ref 0.350–4.500)

## 2015-09-01 NOTE — Patient Instructions (Addendum)
We will call you with your lab results and determine vitamin D needed. Please continue to check you blood sugars fasting daily and 2 hours after a meal daily. Keep a record and bring this journal with you to your next appointment.  I would like for you to see a nutritionist. Gabriel Cirri is checking on finding a private when that your insurance will cover. I recommend that you also try to find one. Let us know if you do.  Check with [facility] first and if you need a referral there let us know.  [facility]  Address: [address] [place], [place] [address] [phone]  Basic Carbohydrate Counting for Diabetes Mellitus Carbohydrate counting is a method for keeping track of the amount of carbohydrates you eat. Eating carbohydrates naturally increases the level of sugar (glucose) in your blood, so it is important for you to know the amount that is okay for you to have in every meal. Carbohydrate counting helps keep the level of glucose in your blood within normal limits. The amount of carbohydrates allowed is different for every person. A dietitian can help you calculate the amount that is right for you. Once you know the amount of carbohydrates you can have, you can count the carbohydrates in the foods you want to eat. Carbohydrates are found in the following foods:  Grains, such as breads and cereals.  Dried beans and soy products.  Starchy vegetables, such as potatoes, peas, and corn.  Fruit and fruit juices.  Milk and yogurt.  Sweets and snack foods, such as cake, cookies, candy, chips, soft drinks, and fruit drinks. CARBOHYDRATE COUNTING There are two ways to count the carbohydrates in your food. You can use either of the methods or a combination of both. Reading the "Nutrition Facts" on [place] The "Nutrition Facts" is an area that is included on the labels of almost all packaged food and beverages in the [place]. It includes the serving size of that  food or beverage and information about the nutrients in each serving of the food, including the grams (g) of carbohydrate per serving.  Decide the number of servings of this food or beverage that you will be able to eat or drink. Multiply that number of servings by the number of grams of carbohydrate that is listed on the label for that serving. The total will be the amount of carbohydrates you will be having when you eat or drink this food or beverage. Learning Standard Serving Sizes of Food When you eat food that is not packaged or does not include "Nutrition Facts" on the label, you need to measure the servings in order to count the amount of carbohydrates.A serving of most carbohydrate-rich foods contains about 15 g of carbohydrates. The following list includes serving sizes of carbohydrate-rich foods that provide 15 g ofcarbohydrate per serving:   1 slice of bread (1 oz) or 1 six-inch tortilla.    of a hamburger bun or English muffin.  4-6 crackers.   cup unsweetened dry cereal.    cup hot cereal.   cup rice or pasta.    cup mashed potatoes or  of a large baked potato.  1 cup fresh fruit or one small piece of fruit.    cup canned or frozen fruit or fruit juice.  1 cup milk.   cup plain fat-free yogurt or yogurt sweetened with artificial sweeteners.   cup cooked dried beans or starchy vegetable, such as peas, corn, or potatoes.  Decide the  number of standard-size servings that you will eat. Multiply that number of servings by 15 (the grams of carbohydrates in that serving). For example, if you eat 2 cups of strawberries, you will have eaten 2 servings and 30 g of carbohydrates (2 servings x 15 g = 30 g). For foods such as soups and casseroles, in which more than one food is mixed in, you will need to count the carbohydrates in each food that is included. EXAMPLE OF CARBOHYDRATE COUNTING Sample Dinner  3 oz chicken breast.   cup of brown rice.   cup of  corn.  1 cup milk.   1 cup strawberries with sugar-free whipped topping.  Carbohydrate Calculation Step 1: Identify the foods that contain carbohydrates:   Rice.   Corn.   Milk.   Strawberries. Step 2:Calculate the number of servings eaten of each:   2 servings of rice.   1 serving of corn.   1 serving of milk.   1 serving of strawberries. Step 3: Multiply each of those number of servings by 15 g:   2 servings of rice x 15 g = 30 g.   1 serving of corn x 15 g = 15 g.   1 serving of milk x 15 g = 15 g.   1 serving of strawberries x 15 g = 15 g. Step 4: Add together all of the amounts to find the total grams of carbohydrates eaten: 30 g + 15 g + 15 g + 15 g = 75 g.   This information is not intended to replace advice given to you by your health care provider. Make sure you discuss any questions you have with your health care provider.   Document Released: 10/03/2005 Document Revised: 10/24/2014 Document Reviewed: 08/30/2013 Elsevier Interactive Patient Education [employer].

## 2015-09-01 NOTE — Progress Notes (Signed)
Subjective:    Patient ID: Jessica Pace, female    DOB: 09-03-1961, 54 y.o.   MRN: [id number]  HPI Chief Complaint  Patient presents with  . 1 month follow-up    1 month follow-up. forgot blood sugar book. morning is running around 130-135 and then evening time 135-150. Nutrition-- is hospital based and too expensive, so she needs a private office- and will call where her daughter goes   She is here for diabetic follow-up. Upon entering the room the patient was texting on her cell phone. She then answered a phone call while I was in the room and continued to talk to the person on the other end. I stepped out of the room and gave her time to complete her phone call. Sabrina, CMA, then went into room and advised patient of the no cell phone policy of our office. She agreed to stop using the phone and I proceeded with the visit.  Concerns/complaints: none today Denies fever, chills, headache, dizziness, chest pain, palpitations, GI or GU symptoms.   At her last visit we adjusted her medications and she was asked to check her blood sugar twice daily, once in morning fasting and then 2 hours after a meal and then bring in her blood sugar readings. She states she has been checking her blood sugar however she states she forgot to bring in her journal with the readings today.  Diet: States she cannot afford nutritionist that I referred her to, that her insurance will not cover it.  States she is eating more vegetables, fruit, stopped drinking sodas entirely.  Blood sugars: Reports A.M fasting 113-130s, 2 hours after meals 130-140, bedtime 130-145 Medications: States she is taking 20 units of Lantus per night, Injecting Trulicity every Wednesday, and taking Metformin 1000 mg bid. Reports good compliance.  Physical activity: walking 30 minutes 5 days per week  Reviewed allergies, medications, past medical and social history.   Reviewed her medical records from [facility]. Discussed with  patient. She admits to history of hypothyroidism and states she took medication for some time but then was taken off of it. She also reports that she has been taking vitamin D 50,000 IU for past 3 years. Also reports history of elevated cholesterol at her initial diagnosis of diabetes in 2013. States she was on medication for cholesterol but then was taken off once her numbers got under control.   Review of Systems Pertinent positives and negatives in the history of present illness.    Objective:   Physical Exam  Constitutional: She is oriented to person, place, and time. She appears well-developed and well-nourished. No distress.  Neurological: She is alert and oriented to person, place, and time. Coordination and gait normal.  Skin: Skin is warm and dry. No cyanosis. No pallor. Nails show no clubbing.  Psychiatric: She has a normal mood and affect. Her speech is normal and behavior is normal. Judgment and thought content normal. Cognition and memory are normal.  BP 128/80 mmHg  Pulse 68  Wt 218 lb 3.2 oz (98.975 kg)  Urinalysis dipstick negative Urine microalbumin within normal range    Assessment & Plan:  Uncontrolled type 2 diabetes mellitus without complication, with long-term current use of insulin (HCC) - Plan: CBC with Differential/Platelet, Comprehensive metabolic panel, POCT urinalysis dipstick, Lipid panel  Essential hypertension  Obesity (BMI 30-39.9)  History of vitamin D deficiency - Plan: VITAMIN D 25 Hydroxy (Vit-D Deficiency, Fractures)  Hyperlipidemia associated with type 2 diabetes mellitus ([place]) -  Plan: Lipid panel  History of hypothyroidism - Plan: TSH  Discussed with patient that she needs to make her health a priority and that the patient provider relationship is a two-way street. Explained that I will certainly work with her and help her with recommendations and medications but that she must also except responsibility for checking her blood sugars and reporting  back to me. Discussed risks and consequences of uncontrolled diabetes including blindness, kidney damage, amputation, heart attack and stroke. Discussed that she is relatively newly diagnosed, within past 3 years and that it is important to keep her blood sugar under control in order to prevent or prolong these consequences.   Discussed that she has not been to the nutritionist due to financial concerns, which is understandable. We will work on finding a Financial planner that will be covered by her insurance and advised her that she should also do the same. In the meantime, discussed carb counting and continuing to watch sugar intake. Also discussed that she should get a minimum of 150 minutes of physical activity per week, outside of what she is walking on a daily basis for work. No changes to her medications. Blood pressure readings that she reports to me today are very close to goal. Will follow-up pending lab results and in 2 months she will return for diabetes follow-up and a hemoglobin A1c. Hypertension- good control, no changes. Discussed DASH diet, she has heard of this in past.   Hhistory of hypothyroidism and has been on medication in past per patient- will follow-up pending TSH result Hyperlipidemia- She states she has been on cholesterol medication in the past and it was discontinued by her previous primary care provider- will follow up pending lipid panel.  History of vitamin D deficiency- will not refill Vitamin D 50000 units today, will await Vitamin D level and determine appropriate dose.   Spent a minimum of 30 minutes with patient and at least 50% of that in counseling.

## 2015-09-02 ENCOUNTER — Telehealth: Payer: Self-pay

## 2015-09-02 LAB — VITAMIN D 25 HYDROXY (VIT D DEFICIENCY, FRACTURES): VIT D 25 HYDROXY: 98 ng/mL (ref 30–100)

## 2015-09-02 NOTE — Telephone Encounter (Signed)
Deny this request. Her vitamin D level is normal and I discussed with her that she will start taking over the counter strength vitamin D. Thanks

## 2015-09-02 NOTE — Telephone Encounter (Signed)
Pt informed

## 2015-09-02 NOTE — Telephone Encounter (Signed)
Refill request for Vitamin D3 50MU #[phone]

## 2015-09-15 ENCOUNTER — Encounter: Payer: Self-pay | Admitting: Family Medicine

## 2015-09-18 ENCOUNTER — Ambulatory Visit
Admission: RE | Admit: 2015-09-18 | Discharge: 2015-09-18 | Disposition: A | Discharge status: Home / Self Care | Payer: Managed Care, Other (non HMO) | Source: Ambulatory Visit | Attending: Family Medicine | Admitting: Family Medicine

## 2015-09-18 DIAGNOSIS — Z1239 Encounter for other screening for malignant neoplasm of breast: Secondary | ICD-10-CM

## 2015-11-02 ENCOUNTER — Ambulatory Visit (INDEPENDENT_AMBULATORY_CARE_PROVIDER_SITE_OTHER): Payer: Managed Care, Other (non HMO) | Admitting: Family Medicine

## 2015-11-02 ENCOUNTER — Telehealth: Payer: Self-pay | Admitting: Internal Medicine

## 2015-11-02 ENCOUNTER — Encounter: Payer: Self-pay | Admitting: Family Medicine

## 2015-11-02 VITALS — BP 122/80 | HR 68 | Wt 219.8 lb

## 2015-11-02 DIAGNOSIS — L608 Other nail disorders: Secondary | ICD-10-CM

## 2015-11-02 DIAGNOSIS — L609 Nail disorder, unspecified: Secondary | ICD-10-CM | POA: Diagnosis not present

## 2015-11-02 DIAGNOSIS — E119 Type 2 diabetes mellitus without complications: Secondary | ICD-10-CM

## 2015-11-02 DIAGNOSIS — E1169 Type 2 diabetes mellitus with other specified complication: Secondary | ICD-10-CM | POA: Insufficient documentation

## 2015-11-02 DIAGNOSIS — I10 Essential (primary) hypertension: Secondary | ICD-10-CM | POA: Diagnosis not present

## 2015-11-02 DIAGNOSIS — E669 Obesity, unspecified: Secondary | ICD-10-CM | POA: Diagnosis not present

## 2015-11-02 NOTE — Telephone Encounter (Signed)
Pt is wanting to go to a weight loss counselor and asked that i make a referral to [facility]. They asked that i fax over the last ov notes and a note saying that she needs an appt. Fax # [phone] attention:ketura phone # [phone]. Vickie has ok'ed this.

## 2015-11-02 NOTE — Progress Notes (Signed)
  Subjective:    Patient ID: Jessica Pace, female    DOB: [birth year], 55 y.o.   MRN: [id number]  Jessica Pace is a 55 y.o. female who presents for follow-up of Type 2 diabetes mellitus. She did not bring in her blood sugar readings as requested. This is the second visit that she did not bring in readings. States she forgot them. She is able to state some readings however.   Patient is checking home blood sugars.   Home blood sugar records: ranging between 98 and 138 How often is blood sugars being checked: twice daily, morning fasting and before bedtime (2 hours after meal) Current symptoms include: none. Patient denies none.  Patient is not checking their feet daily.  Any Foot concerns (callous, ulcer, wound, thickened nails, toenail fungus, skin fungus, hammer toe): discoloration of toe nail on great toe right foot and sore Last dilated eye exam: October 2016  Current treatments: Metformin 1000mg  twice daily, Lantus 10 units each evening, Trulicity every Wednesday.  Medication compliance: excellent  Current diet: low carb, no caffeine, overall pretty healthy per patient. Would like to see a nutritionist in [place], needs referral. Current exercise: walking 30 minutes 5 days a week Known diabetic complications: none  The following portions of the patient's history were reviewed and updated as appropriate: allergies, current medications, past medical history, past social history and problem list.  ROS as in subjective above.     Objective:    Physical Exam Alert and in no distress. Great toe on right foot darkened, thickened and tender to palpation, normal sensation, cap refill, foot otherwise normal. not otherwise not examined.  Blood pressure 122/80, pulse 68, weight 219 lb 12.8 oz (99.701 kg).  Lab Review Diabetic Labs Latest Ref Rng 09/01/2015 08/06/2015 07/26/2015 08/08/2012  HbA1c - - 8.7% - -  Chol 125 - 200 mg/dL 137 - - -  HDL >=46 mg/dL 32(L) - - -  Calc LDL <130  mg/dL 79 - - -  Triglycerides <150 mg/dL 129 - - -  Creatinine 0.50 - 1.05 mg/dL 0.67 - 0.69 0.90   BP/Weight 11/02/2015 09/01/2015 08/06/2015 07/28/2015 0000000  Systolic BP 123XX123 0000000 [id number] 0000000 [id number]  Diastolic BP 80 80 78 74 64  Wt. (Lbs) 219.8 218.2 219 219.2 222.8  BMI 37.16 36.89 37.02 37.06 38.22   No flowsheet data found.  [name]  reports that she has never smoked. She does not have any smokeless tobacco history on file. She reports that she does not drink alcohol or use illicit drugs.     Assessment & Plan:    Controlled type 2 diabetes mellitus without complication, without long-term current use of insulin ([place]) - Plan: Ambulatory referral to Podiatry  Essential hypertension  Obesity (BMI 30-39.9)  Toenail deformity - Plan: Ambulatory referral to Podiatry  1. Rx changes: none 2. Education: Reviewed 'ABCs' of diabetes management (respective goals in parentheses):  A1C (<7), blood pressure (<130/80), and cholesterol (LDL <100).Blood pressure within goal.  3. Compliance at present is estimated to be good. Efforts to improve compliance (if necessary) will be directed at dietary modifications: nutritionist, increased exercise and regular blood sugar monitoring: twice times daily. 4. Follow up: 4 months  Recommend that she return after 11/06/2015 for Hemoglobin A1C since today's visit is too soon to do this, within a 3 month period.  5.   Follow up with Podiatry, referral made.

## 2015-11-19 ENCOUNTER — Encounter: Payer: Self-pay | Admitting: Internal Medicine

## 2015-12-08 ENCOUNTER — Encounter: Payer: Self-pay | Admitting: Family Medicine

## 2015-12-08 ENCOUNTER — Telehealth: Payer: Self-pay | Admitting: Family Medicine

## 2015-12-08 ENCOUNTER — Ambulatory Visit (INDEPENDENT_AMBULATORY_CARE_PROVIDER_SITE_OTHER): Payer: Managed Care, Other (non HMO) | Admitting: Family Medicine

## 2015-12-08 VITALS — BP 130/82 | HR 67 | Temp 97.9°F | Wt 219.0 lb

## 2015-12-08 DIAGNOSIS — J020 Streptococcal pharyngitis: Secondary | ICD-10-CM

## 2015-12-08 DIAGNOSIS — J029 Acute pharyngitis, unspecified: Secondary | ICD-10-CM | POA: Diagnosis not present

## 2015-12-08 LAB — POCT MONO (EPSTEIN BARR VIRUS): MONO, POC: NEGATIVE

## 2015-12-08 LAB — POC INFLUENZA A&B (BINAX/QUICKVUE)
Influenza A, POC: NEGATIVE
Influenza B, POC: NEGATIVE

## 2015-12-08 LAB — POCT RAPID STREP A (OFFICE): RAPID STREP A SCREEN: POSITIVE — AB

## 2015-12-08 MED ORDER — LOSARTAN POTASSIUM 25 MG PO TABS: 25.0000 mg | ORAL_TABLET | Freq: Every day | ORAL | Status: DC

## 2015-12-08 MED ORDER — AMOXICILLIN 875 MG PO TABS: 875.0000 mg | ORAL_TABLET | Freq: Two times a day (BID) | ORAL | Status: DC

## 2015-12-08 NOTE — Telephone Encounter (Signed)
She could possibly have a viral syndrome such as the flu or could be something else. I recommend that she make an appointment. In the meantime she should continue treating her symptoms.

## 2015-12-08 NOTE — Telephone Encounter (Signed)
Pt is coming in today to be seen. And refilled med to pharmacy

## 2015-12-08 NOTE — Progress Notes (Signed)
Subjective:  Jessica Pace is a 55 y.o. female who presents for evaluation of a 3 day history of sore throat and fever and back ache.  She has not had a recent close exposure to someone with proven streptococcal pharyngitis. Denies ear pain, congestion, drainage, cough, nausea, vomiting, diarrhea.    Treatment to date: tylenol and vitamin C.  ? sick contacts.  No other aggravating or relieving factors.  No other c/o.  The following portions of the patient's history were reviewed and updated as appropriate: allergies, current medications, past medical history, past social history, past surgical history and problem list.  ROS as in subjective   Objective: Filed Vitals:   12/08/15 1607  BP: 130/82  Pulse: 67  Temp: 97.9 F (36.6 C)    General appearance: no distress, WD/WN, mildly ill-appearing HEENT: normocephalic, conjunctiva/corneas normal, sclerae anicteric, nares patent, no discharge or erythema, pharynx with erythema, and exudate.  Oral cavity: MMM, no lesions  Neck: supple, anterior cervical lymphadenopathy and tenderness. Heart: RRR, normal S1, S2, no murmurs Lungs: CTA bilaterally, no wheezes, rhonchi, or rales Abdomen: +bs, soft, non tender, non distended, no masses, no hepatomegaly, no splenomegaly   Laboratory Strep test done. Results:positive.  negative mono spot and flu swab.   Assessment and Plan: Acute streptococcal pharyngitis - Plan: amoxicillin (AMOXIL) 875 MG tablet, POCT rapid strep A  Sore throat - Plan: POC Influenza A&B, Mono (Epstein Barr Virus)   Advised that symptoms and exam suggest a bacterial etiology. Positive for strep.  Discussed symptomatic treatment including salt water gargles, warm fluids, rest, hydrate well, can use over-the-counter Tylenol or Ibuprofen for throat pain, fever, or malaise. If worse or not improving in next 2 days she will call. If she is not back to normal after completing the antibiotic she will let me know. She is due for  diabetes follow up and will schedule this soon.

## 2015-12-08 NOTE — Telephone Encounter (Signed)
Pt said fever started on Saturday. It was 101 on Saturday and went down after taking med but it keeps going back up. Glands are swollewn on neck, sore throat, back pain on right side. Pt wants advise on if she should come in for appt prior to scheduling or if she should keep treating at home.  Pt also requesting refill on Losartan at [facility] at [facility] She said pharmacy told her they already sent a request on Friday.

## 2015-12-08 NOTE — Patient Instructions (Signed)
Your flu and mono tests were negative but you do have strep throat.  Complete the antibiotic. Treat your sore throat with saltwater gargles several times throughout the day as well as Tylenol or ibuprofen for pain.  Strep Throat Strep throat is a bacterial infection of the throat. Your health care provider may call the infection tonsillitis or pharyngitis, depending on whether there is swelling in the tonsils or at the back of the throat. Strep throat is most common during the cold months of the year in children who are 82-3 years of age, but it can happen during any season in people of any age. This infection is spread from person to person (contagious) through coughing, sneezing, or close contact. CAUSES Strep throat is caused by the bacteria called Streptococcus pyogenes. RISK FACTORS This condition is more likely to develop in:  People who spend time in crowded places where the infection can spread easily.  People who have close contact with someone who has strep throat. SYMPTOMS Symptoms of this condition include:  Fever or chills.   Redness, swelling, or pain in the tonsils or throat.  Pain or difficulty when swallowing.  White or yellow spots on the tonsils or throat.  Swollen, tender glands in the neck or under the jaw.  Red rash all over the body (rare). DIAGNOSIS This condition is diagnosed by performing a rapid strep test or by taking a swab of your throat (throat culture test). Results from a rapid strep test are usually ready in a few minutes, but throat culture test results are available after one or two days. TREATMENT This condition is treated with antibiotic medicine. HOME CARE INSTRUCTIONS Medicines  Take over-the-counter and prescription medicines only as told by your health care provider.  Take your antibiotic as told by your health care provider. Do not stop taking the antibiotic even if you start to feel better.  Have family members who also have a sore  throat or fever tested for strep throat. They may need antibiotics if they have the strep infection. Eating and Drinking  Do not share food, drinking cups, or personal items that could cause the infection to spread to other people.  If swallowing is difficult, try eating soft foods until your sore throat feels better.  Drink enough fluid to keep your urine clear or pale yellow. General Instructions  Gargle with a salt-water mixture 3-4 times per day or as needed. To make a salt-water mixture, completely dissolve -1 tsp of salt in 1 cup of warm water.  Make sure that all household members wash their hands well.  Get plenty of rest.  Stay home from school or work until you have been taking antibiotics for 24 hours.  Keep all follow-up visits as told by your health care provider. This is important. SEEK MEDICAL CARE IF:  The glands in your neck continue to get bigger.  You develop a rash, cough, or earache.  You cough up a thick liquid that is green, yellow-brown, or bloody.  You have pain or discomfort that does not get better with medicine.  Your problems seem to be getting worse rather than better.  You have a fever. SEEK IMMEDIATE MEDICAL CARE IF:  You have new symptoms, such as vomiting, severe headache, stiff or painful neck, chest pain, or shortness of breath.  You have severe throat pain, drooling, or changes in your voice.  You have swelling of the neck, or the skin on the neck becomes red and tender.  You have  signs of dehydration, such as fatigue, dry mouth, and decreased urination.  You become increasingly sleepy, or you cannot wake up completely.  Your joints become red or painful.   This information is not intended to replace advice given to you by your health care provider. Make sure you discuss any questions you have with your health care provider.   Document Released: 09/30/2000 Document Revised: 06/24/2015 Document Reviewed: 01/26/2015 Elsevier  Interactive Patient Education [employer].

## 2015-12-08 NOTE — Telephone Encounter (Signed)
Received signed request to fax records to [facility]. Records faxed to [phone]

## 2015-12-11 ENCOUNTER — Ambulatory Visit (INDEPENDENT_AMBULATORY_CARE_PROVIDER_SITE_OTHER): Payer: Managed Care, Other (non HMO) | Admitting: Podiatry

## 2015-12-11 ENCOUNTER — Encounter: Payer: Self-pay | Admitting: Podiatry

## 2015-12-11 DIAGNOSIS — M2041 Other hammer toe(s) (acquired), right foot: Secondary | ICD-10-CM | POA: Diagnosis not present

## 2015-12-11 DIAGNOSIS — B351 Tinea unguium: Secondary | ICD-10-CM

## 2015-12-11 DIAGNOSIS — E119 Type 2 diabetes mellitus without complications: Secondary | ICD-10-CM | POA: Diagnosis not present

## 2015-12-11 NOTE — Progress Notes (Signed)
   Subjective:    Patient ID: Jessica Pace, female    DOB: 1961-05-10, 55 y.o.   MRN: [id number]  HPI this patient presents to the office with multiple foot concerns on both feet. She was referred to the office by her medical doctor who was concerned about the thickness of the big toenail on her right foot. She says she is also a diabetic and desires a foot exam at this time. She also admits to having a painful corn on the fifth toe of the right foot greater than her left foot. Finally, she is concerned about the dryness of her feet. She presents the office for an evaluation and treatment of these conditions    Review of Systems  Constitutional: Positive for fatigue and unexpected weight change.  Allergic/Immunologic: Positive for environmental allergies.       Objective:   Physical Exam GENERAL APPEARANCE: Alert, conversant. Appropriately groomed. No acute distress.  VASCULAR: Pedal pulses palpable at  [facility] and PT bilateral.  Capillary refill time is immediate to all digits,  Normal temperature gradient.  Digital hair growth is present bilateral  NEUROLOGIC: sensation is normal to 5.07 monofilament at 5/5 sites bilateral.  Light touch is intact bilateral, Muscle strength normal.  MUSCULOSKELETAL: acceptable muscle strength, tone and stability bilateral.  Intrinsic muscluature intact bilateral.  Rectus appearance of foot and digits noted bilateral. Adducto varus fifth toes both fifth toes.  Hammer toe 2nd B/L.  DERMATOLOGIC: skin color, texture, and turgor are within normal limits.  No preulcerative lesions or ulcers  are seen, no interdigital maceration noted.  No open lesions present.  . No drainage noted. Mild xerosis noted.  NAILS  Thick disfigured discolored nails right foot.         Assessment & Plan:  Onychomycosis right foot.  Hammer toes 2,5 B/L.    IE  Debridement of nails.  Discussed surgical intervention both feet.,  Prescribed lac-hydrin.  Recommend formula 3 for treatment  of fungus.   Gardiner Barefoot DPM

## 2015-12-28 ENCOUNTER — Other Ambulatory Visit: Payer: Self-pay | Admitting: Family Medicine

## 2016-02-04 ENCOUNTER — Other Ambulatory Visit: Payer: Self-pay | Admitting: Family Medicine

## 2016-02-29 ENCOUNTER — Encounter: Payer: Self-pay | Admitting: Medical

## 2016-02-29 ENCOUNTER — Ambulatory Visit (INDEPENDENT_AMBULATORY_CARE_PROVIDER_SITE_OTHER): Payer: Managed Care, Other (non HMO) | Admitting: Medical

## 2016-02-29 VITALS — BP 110/80 | HR 73 | Wt 215.0 lb

## 2016-02-29 DIAGNOSIS — I10 Essential (primary) hypertension: Secondary | ICD-10-CM

## 2016-02-29 DIAGNOSIS — R21 Rash and other nonspecific skin eruption: Secondary | ICD-10-CM | POA: Diagnosis not present

## 2016-02-29 DIAGNOSIS — Z794 Long term (current) use of insulin: Secondary | ICD-10-CM

## 2016-02-29 DIAGNOSIS — B372 Candidiasis of skin and nail: Secondary | ICD-10-CM

## 2016-02-29 DIAGNOSIS — Z8639 Personal history of other endocrine, nutritional and metabolic disease: Secondary | ICD-10-CM | POA: Diagnosis not present

## 2016-02-29 DIAGNOSIS — E1165 Type 2 diabetes mellitus with hyperglycemia: Secondary | ICD-10-CM

## 2016-02-29 DIAGNOSIS — IMO0001 Reserved for inherently not codable concepts without codable children: Secondary | ICD-10-CM

## 2016-02-29 LAB — COMPREHENSIVE METABOLIC PANEL
ALBUMIN: 4.4 g/dL (ref 3.6–5.1)
ALK PHOS: 56 U/L (ref 33–130)
ALT: 34 U/L — AB (ref 6–29)
AST: 22 U/L (ref 10–35)
BUN: 16 mg/dL (ref 7–25)
CALCIUM: 9.3 mg/dL (ref 8.6–10.4)
CO2: 27 mmol/L (ref 20–31)
Chloride: 103 mmol/L (ref 98–110)
Creat: 0.81 mg/dL (ref 0.50–1.05)
Glucose, Bld: 96 mg/dL (ref 65–99)
POTASSIUM: 4.7 mmol/L (ref 3.5–5.3)
Sodium: 141 mmol/L (ref 135–146)
TOTAL PROTEIN: 7.6 g/dL (ref 6.1–8.1)
Total Bilirubin: 0.5 mg/dL (ref 0.2–1.2)

## 2016-02-29 LAB — T4, FREE: FREE T4: 0.8 ng/dL (ref 0.8–1.8)

## 2016-02-29 LAB — HEMOGLOBIN A1C
HEMOGLOBIN A1C: 6.4 % — AB (ref <5.7)
MEAN PLASMA GLUCOSE: 137 mg/dL

## 2016-02-29 LAB — TSH: TSH: 3.38 mIU/L

## 2016-02-29 MED ORDER — CETIRIZINE HCL 10 MG PO TABS: 10.0000 mg | ORAL_TABLET | Freq: Every day | ORAL | Status: DC

## 2016-02-29 MED ORDER — LOSARTAN POTASSIUM 25 MG PO TABS: 25.0000 mg | ORAL_TABLET | Freq: Every day | ORAL | Status: DC

## 2016-02-29 MED ORDER — TERBINAFINE HCL 1 % EX CREA: 1.0000 "application " | TOPICAL_CREAM | Freq: Two times a day (BID) | CUTANEOUS | Status: DC

## 2016-02-29 NOTE — Progress Notes (Signed)
Subjective: Chief Complaint  Patient presents with  . Diabetes    120 fasting sugar normally. lowest is 97 and highest has been 138. states that she is starting to itch in between breasts, under chin, and on arms.   Here for f/u.   Normally sees Vickie NP here.   She has hx/o hypertension, diabetes, hypothyroidism with reportedly normal labs of recent.    Currently for diabetes taking Trulicity injection weekly, Lantus 5u QHS, Metformin 1000mg  once daily.  Checks glucose at different times.   Yesterday was 108 glucose an hour after eating.  Last night 125.   Checks glucose in the evenings and sometimes at other times.   Was diagnosed with diabetes 2013.  HTN - taking Losartan 25mg  daily  Takes a baby aspirin daily.  Not on statin.     Exercise with walking daily.   Diet is much better than it had been months ago.   Saw nutritionist at [facility].  Sometimes skips meals.  Has been adding a protein meal replacement drink when she would otherwise skip a meal.   Had pneumonia vaccine 2016.   Last Td probably within 7 years.    She notes that her problems started a few years ago when she was under stress.  Her daughter came home from [place], got pregnancy, she was working many hours with a long commute, and health started to change, became diabetic.   She is really trying hard to keep things under control, is hopeful she can back off some of her medication.   Has itching of late, antecubital areas, neck, in between breasts.   Past Medical History  Diagnosis Date  . Diabetes mellitus without complication ([place])   . Hypertension    ROS as in subjective   Objective: BP 110/80 mmHg  Pulse 73  Wt 215 lb (97.523 kg)  General appearance: alert, no distress, WD/WN, obese AA female, very pleasant Skin: mild irritated appearing skin of antecubital region and neck Oral cavity: MMM, no lesions Neck: supple, no lymphadenopathy, no thyromegaly, no masses Heart: RRR, normal S1, S2, no murmurs Lungs:  CTA bilaterally, no wheezes, rhonchi, or rales Pulses: 1+ symmetric, upper and lower extremities, normal cap refill Ext: no edema Breast cleavage (clothed) with mild irritation, mild erythema   Diabetic Foot Exam - Simple   Simple Foot Form  Diabetic Foot exam was performed with the following findings:  Yes 02/29/2016  9:15 AM  Visual Inspection  No deformities, no ulcerations, no other skin breakdown bilaterally:  Yes  Sensation Testing  Intact to touch and monofilament testing bilaterally:  Yes  Pulse Check  See comments:  Yes  Comments  1+ pulses         Assessment: Encounter Diagnoses  Name Primary?  Marland Kitchen Uncontrolled type 2 diabetes mellitus without complication, with long-term current use of insulin ([place]) Yes  . Essential hypertension   . History of hypothyroidism   . Candidal intertrigo   . Rash and nonspecific skin eruption      Plan: Routine labs, advised daily foot checks, discussed diet, exercise, medications.     HTN - c/t Losartan  Rash likely due to pollen/environmental allergen.  Begin QHS cetirizine  Intertrigo - begin Lamisil cream topically  F/u pending labs   Favour was seen today for diabetes.  Diagnoses and all orders for this visit:  Uncontrolled type 2 diabetes mellitus without complication, with long-term current use of insulin (HCC) -     Comprehensive metabolic panel -  Hemoglobin A1c -     Microalbumin / creatinine urine ratio -     HM DIABETES EYE EXAM -     HM DIABETES FOOT EXAM  Essential hypertension -     Comprehensive metabolic panel  History of hypothyroidism -     T4, free -     TSH  Candidal intertrigo  Rash and nonspecific skin eruption  Other orders -     terbinafine (LAMISIL AT) 1 % cream; Apply 1 application topically 2 (two) times daily. In between breasts -     cetirizine (ZYRTEC) 10 MG tablet; Take 1 tablet (10 mg total) by mouth daily. -     losartan (COZAAR) 25 MG tablet; Take 1 tablet (25 mg total) by  mouth daily.

## 2016-03-01 ENCOUNTER — Other Ambulatory Visit: Payer: Self-pay | Admitting: Medical

## 2016-03-01 ENCOUNTER — Ambulatory Visit: Payer: Managed Care, Other (non HMO) | Admitting: Family Medicine

## 2016-03-01 LAB — MICROALBUMIN / CREATININE URINE RATIO
CREATININE, URINE: 168 mg/dL (ref 20–320)
MICROALB UR: 0.8 mg/dL
Microalb Creat Ratio: 5 mcg/mg creat (ref <30)

## 2016-03-01 MED ORDER — DULAGLUTIDE 0.75 MG/0.5ML ~~LOC~~ SOAJ: 1.0000 "application " | SUBCUTANEOUS | Status: DC

## 2016-03-01 MED ORDER — METFORMIN HCL 1000 MG PO TABS: 1000.0000 mg | ORAL_TABLET | Freq: Every day | ORAL | Status: DC

## 2016-03-15 ENCOUNTER — Ambulatory Visit: Payer: Managed Care, Other (non HMO) | Admitting: Podiatry

## 2016-08-10 ENCOUNTER — Emergency Department (HOSPITAL_COMMUNITY)
Admission: EM | Admit: 2016-08-10 | Discharge: 2016-08-10 | Disposition: A | Discharge status: Home / Self Care | Payer: Managed Care, Other (non HMO) | Attending: Emergency Medicine | Admitting: Emergency Medicine

## 2016-08-10 ENCOUNTER — Encounter (HOSPITAL_COMMUNITY): Payer: Self-pay

## 2016-08-10 DIAGNOSIS — R1032 Left lower quadrant pain: Secondary | ICD-10-CM | POA: Diagnosis present

## 2016-08-10 DIAGNOSIS — E039 Hypothyroidism, unspecified: Secondary | ICD-10-CM | POA: Diagnosis not present

## 2016-08-10 DIAGNOSIS — E119 Type 2 diabetes mellitus without complications: Secondary | ICD-10-CM | POA: Diagnosis not present

## 2016-08-10 DIAGNOSIS — I10 Essential (primary) hypertension: Secondary | ICD-10-CM | POA: Insufficient documentation

## 2016-08-10 DIAGNOSIS — Z79899 Other long term (current) drug therapy: Secondary | ICD-10-CM | POA: Insufficient documentation

## 2016-08-10 DIAGNOSIS — Z7984 Long term (current) use of oral hypoglycemic drugs: Secondary | ICD-10-CM | POA: Insufficient documentation

## 2016-08-10 LAB — COMPREHENSIVE METABOLIC PANEL
ALT: 64 U/L — ABNORMAL HIGH (ref 14–54)
AST: 35 U/L (ref 15–41)
Albumin: 4.1 g/dL (ref 3.5–5.0)
Alkaline Phosphatase: 68 U/L (ref 38–126)
Anion gap: 6 (ref 5–15)
BUN: 11 mg/dL (ref 6–20)
CO2: 25 mmol/L (ref 22–32)
Calcium: 9.2 mg/dL (ref 8.9–10.3)
Chloride: 106 mmol/L (ref 101–111)
Creatinine, Ser: 0.92 mg/dL (ref 0.44–1.00)
GFR calc Af Amer: >60 mL/min (ref >60)
GFR calc non Af Amer: >60 mL/min (ref >60)
Glucose, Bld: 177 mg/dL — ABNORMAL HIGH (ref 65–99)
Potassium: 4.1 mmol/L (ref 3.5–5.1)
Sodium: 137 mmol/L (ref 135–145)
Total Bilirubin: 0.4 mg/dL (ref 0.3–1.2)
Total Protein: 8 g/dL (ref 6.5–8.1)

## 2016-08-10 LAB — CBC WITH DIFFERENTIAL/PLATELET
Basophils Absolute: 0 10*3/uL (ref 0.0–0.1)
Basophils Relative: 1 %
Eosinophils Absolute: 0.1 10*3/uL (ref 0.0–0.7)
Eosinophils Relative: 3 %
HCT: 34.2 % — ABNORMAL LOW (ref 36.0–46.0)
Hemoglobin: 11.3 g/dL — ABNORMAL LOW (ref 12.0–15.0)
Lymphocytes Relative: 33 %
Lymphs Abs: 1.6 10*3/uL (ref 0.7–4.0)
MCH: 28.1 pg (ref 26.0–34.0)
MCHC: 33 g/dL (ref 30.0–36.0)
MCV: 85.1 fL (ref 78.0–100.0)
Monocytes Absolute: 0.3 10*3/uL (ref 0.1–1.0)
Monocytes Relative: 6 %
Neutro Abs: 2.7 10*3/uL (ref 1.7–7.7)
Neutrophils Relative %: 57 %
Platelets: 233 10*3/uL (ref 150–400)
RBC: 4.02 MIL/uL (ref 3.87–5.11)
RDW: 14.8 % (ref 11.5–15.5)
WBC: 4.7 10*3/uL (ref 4.0–10.5)

## 2016-08-10 LAB — URINALYSIS, ROUTINE W REFLEX MICROSCOPIC
Bilirubin Urine: NEGATIVE
Glucose, UA: NEGATIVE mg/dL
Hgb urine dipstick: NEGATIVE
Ketones, ur: NEGATIVE mg/dL
Leukocytes, UA: NEGATIVE
Nitrite: NEGATIVE
Protein, ur: NEGATIVE mg/dL
Specific Gravity, Urine: 1.008 (ref 1.005–1.030)
pH: 5.5 (ref 5.0–8.0)

## 2016-08-10 MED ORDER — MORPHINE SULFATE (PF) 2 MG/ML IV SOLN
4.0000 mg | Freq: Once | INTRAVENOUS | Status: DC
  Filled 2016-08-10: qty 2

## 2016-08-10 MED ORDER — MORPHINE SULFATE (PF) 2 MG/ML IV SOLN
2.0000 mg | Freq: Once | INTRAVENOUS | Status: AC
  Administered 2016-08-10: 2 mg via INTRAVENOUS

## 2016-08-10 MED ORDER — ONDANSETRON HCL 4 MG PO TABS: 4.0000 mg | ORAL_TABLET | Freq: Four times a day (QID) | ORAL | 0 refills | Status: DC

## 2016-08-10 MED ORDER — ONDANSETRON HCL 4 MG/2ML IJ SOLN
4.0000 mg | Freq: Once | INTRAMUSCULAR | Status: AC
  Administered 2016-08-10: 4 mg via INTRAVENOUS
  Filled 2016-08-10: qty 2

## 2016-08-10 MED ORDER — SODIUM CHLORIDE 0.9 % IV BOLUS (SEPSIS)
1000.0000 mL | Freq: Once | INTRAVENOUS | Status: AC
  Administered 2016-08-10: 1000 mL via INTRAVENOUS

## 2016-08-10 NOTE — ED Provider Notes (Signed)
[facility] DEPT Provider Note   CSN: [id number] Arrival date & time: 08/10/16  2094     History   Chief Complaint Chief Complaint  Patient presents with  . Abdominal Pain    HPI Jessica Pace is a 55 y.o. female who was recently diagnosed with diverticulitis who presents with continued abdominal pain. Patient had a CT abdomen/pelvis 5 days ago and was started on Cipro and Flagyl 2 days ago. Patient has left lower quadrant pain and profuse diarrhea. Her pain radiates to her left lower back. Patient states she has had watery, green diarrhea every hour. Patient has had some nausea. She denies any blood in her stools. Patient denies any fevers, chest pain, shortness of breath, vomiting, urinary symptoms.  HPI  Past Medical History:  Diagnosis Date  . Diabetes mellitus without complication ([place])   . Hypertension     Patient Active Problem List   Diagnosis Date Noted  . History of hypothyroidism 09/01/2015  . Obesity (BMI 30-39.9) 08/10/2015  . Diabetes type 2, uncontrolled ([place]) 08/10/2015  . Essential hypertension 08/10/2015  . History of vitamin D deficiency 08/10/2015  . History of headache 08/10/2015    Past Surgical History:  Procedure Laterality Date  . ABDOMINAL HYSTERECTOMY  1998    OB History    No data available       Home Medications    Prior to Admission medications   Medication Sig Start Date End Date Taking? Authorizing Provider  ciprofloxacin (CIPRO) 500 MG tablet Take 500 mg by mouth 2 (two) times daily. 7 days -started 10/21 08/05/16  Yes Historical Provider, MD  Dulaglutide (TRULICITY) 7.09 GG/8.3MO SOPN Inject 1 application into the skin once a week. 03/01/16  Yes Camelia Eng Tysinger, PA-C  losartan (COZAAR) 25 MG tablet Take 1 tablet (25 mg total) by mouth daily. 02/29/16  Yes Camelia Eng Tysinger, PA-C  metFORMIN (GLUCOPHAGE) 1000 MG tablet Take 1 tablet (1,000 mg total) by mouth daily with breakfast. 03/01/16  Yes Camelia Eng Tysinger, PA-C    metroNIDAZOLE (FLAGYL) 500 MG tablet Take 500 mg by mouth 4 (four) times daily. 7 days - started 08/06/16 08/05/16  Yes Historical Provider, MD  blood glucose meter kit and supplies Testing 2-3 times daily 02/15/10   Historical Provider, MD  cetirizine (ZYRTEC) 10 MG tablet Take 1 tablet (10 mg total) by mouth daily. Patient not taking: Reported on 08/10/2016 02/29/16   Camelia Eng Tysinger, PA-C  glucose blood test strip 2-3 times a day 02/15/10   Historical Provider, MD  ondansetron (ZOFRAN) 4 MG tablet Take 1 tablet (4 mg total) by mouth every 6 (six) hours. 08/10/16   Vicky Mccanless M Cherly Erno, PA-C  terbinafine (LAMISIL AT) 1 % cream Apply 1 application topically 2 (two) times daily. In between breasts Patient not taking: Reported on 08/10/2016 02/29/16   Carlena Hurl, PA-C    Family History Family History  Problem Relation Age of Onset  . Diabetes Mother   . Hyperlipidemia Father   . Hypertension Father   . Heart disease Maternal Grandmother     Social History Social History  Substance Use Topics  . Smoking status: Never Smoker  . Smokeless tobacco: Not on file  . Alcohol use No     Allergies   Butyl acetate   Review of Systems Review of Systems  Constitutional: Positive for appetite change. Negative for chills and fever.  HENT: Negative for facial swelling and sore throat.   Respiratory: Negative for shortness of breath.   Cardiovascular:  Negative for chest pain.  Gastrointestinal: Positive for abdominal pain, diarrhea and nausea. Negative for blood in stool and vomiting.  Genitourinary: Negative for dysuria.  Musculoskeletal: Positive for back pain.  Skin: Negative for rash and wound.  Neurological: Negative for headaches.  Psychiatric/Behavioral: The patient is not nervous/anxious.      Physical Exam Updated Vital Signs BP 145/92 (BP Location: Right Arm)   Pulse 66   Temp 98.2 F (36.8 C) (Oral)   Resp 14   Ht '5\' 4"'  (1.626 m)   Wt 100.7 kg   SpO2 100%   BMI 38.11  kg/m   Physical Exam  Constitutional: She appears well-developed and well-nourished. No distress.  HENT:  Head: Normocephalic and atraumatic.  Mouth/Throat: Oropharynx is clear and moist. No oropharyngeal exudate.  Eyes: Conjunctivae are normal. Pupils are equal, round, and reactive to light. Right eye exhibits no discharge. Left eye exhibits no discharge. No scleral icterus.  Neck: Normal range of motion. Neck supple. No thyromegaly present.  Cardiovascular: Normal rate, regular rhythm, normal heart sounds and intact distal pulses.  Exam reveals no gallop and no friction rub.   No murmur heard. Pulmonary/Chest: Effort normal and breath sounds normal. No stridor. No respiratory distress. She has no wheezes. She has no rales.  Abdominal: Soft. Bowel sounds are normal. She exhibits no distension. There is tenderness in the left lower quadrant. There is no rebound, no guarding and no tenderness at McBurney's point.    Musculoskeletal: She exhibits no edema.       Lumbar back: She exhibits no tenderness and no bony tenderness.  Lymphadenopathy:    She has no cervical adenopathy.  Neurological: She is alert. Coordination normal.  Skin: Skin is warm and dry. No rash noted. She is not diaphoretic. No pallor.  Psychiatric: She has a normal mood and affect.  Nursing note and vitals reviewed.    ED Treatments / Results  Labs (all labs ordered are listed, but only abnormal results are displayed) Labs Reviewed  COMPREHENSIVE METABOLIC PANEL - Abnormal; Notable for the following:       Result Value   Glucose, Bld 177 (*)    ALT 64 (*)    All other components within normal limits  CBC WITH DIFFERENTIAL/PLATELET - Abnormal; Notable for the following:    Hemoglobin 11.3 (*)    HCT 34.2 (*)    All other components within normal limits  URINALYSIS, ROUTINE W REFLEX MICROSCOPIC (NOT AT [facility])    EKG  EKG Interpretation None       Radiology No results found.  Procedures Procedures  (including critical care time)  Medications Ordered in ED Medications  ondansetron (ZOFRAN) injection 4 mg (4 mg Intravenous Given 08/10/16 1022)  sodium chloride 0.9 % bolus 1,000 mL (0 mLs Intravenous Stopped 08/10/16 1153)  morphine 2 MG/ML injection 2 mg (2 mg Intravenous Given 08/10/16 1022)     Initial Impression / Assessment and Plan / ED Course  I have reviewed the triage vital signs and the nursing notes.  Pertinent labs & imaging results that were available during my care of the patient were reviewed by me and considered in my medical decision making (see chart for details).  Clinical Course    Suspect diverticulitis. Patient reports no worsening of pain, however pain unresolved after initiating antibiotics 2 days ago. CBC shows stable, chronic anemia, hemoglobin 11.3. CMP shows ALT 64. UA negative. Patient given reassurance that her labs are unremarkable and that most likely, she needs to give  antibiotics time to work. Patient did not want narcotic pain medication and advised to take Tylenol for pain. Also given Zofran for nausea, vomiting. 1 L fluid bolus given in ED. Patient well-appearing and vitals stable, doubt further complications at this time considering stable labs and no change in symptoms. Return precautions discussed. Patient to follow up with PCP tomorrow. Patient understands and agrees with plan. Patient vitals stable throughout ED course and discharged in satisfactory condition. I discussed patient case with Dr. Sabra Heck who guided the patient's management and agrees with plan.  Final Clinical Impressions(s) / ED Diagnoses   Final diagnoses:  Left lower quadrant pain    New Prescriptions Discharge Medication List as of 08/10/2016 11:42 AM    START taking these medications   Details  ondansetron (ZOFRAN) 4 MG tablet Take 1 tablet (4 mg total) by mouth every 6 (six) hours., Starting Wed 08/10/2016, Print         Frederica Kuster, PA-C 08/10/16 1447      Noemi Chapel, MD 08/12/16 1049

## 2016-08-10 NOTE — ED Notes (Signed)
Pt has been notified about urinalysis. Will notify when sample is available.

## 2016-08-10 NOTE — ED Triage Notes (Signed)
Pt with dx of diverticulitis on Saturday. ABX prescribed. Told to come to ER if not feeling better.

## 2016-08-10 NOTE — Discharge Instructions (Signed)
Medications: Zofran  Treatment: Continue taking Cipro and Flagyl as prescribed. Make sure to drink plenty of water. You can take Tylenol as prescribed over-the-counter for your pain.  Follow-up: Please follow-up with your primary care provider in one to 2 days for follow-up and further evaluation. Please return to emergency department if you develop any new or worsening symptoms including fever, increasing pain, intractable vomiting, or any other new or concerning symptoms.

## 2016-08-15 ENCOUNTER — Telehealth: Payer: Self-pay

## 2016-08-15 NOTE — Telephone Encounter (Signed)
Spoke with pt- she reports that she had CT scan and diverticulitis. She reports that she is better, and pain is gone.

## 2016-09-04 ENCOUNTER — Emergency Department (HOSPITAL_COMMUNITY): Payer: Managed Care, Other (non HMO)

## 2016-09-04 ENCOUNTER — Encounter (HOSPITAL_COMMUNITY): Payer: Self-pay

## 2016-09-04 ENCOUNTER — Inpatient Hospital Stay (HOSPITAL_COMMUNITY)
Admission: EM | Admit: 2016-09-04 | Discharge: 2016-09-09 | DRG: 392 | Disposition: A | Discharge status: Home / Self Care | Payer: Managed Care, Other (non HMO) | Attending: Internal Medicine | Admitting: Internal Medicine

## 2016-09-04 DIAGNOSIS — K5732 Diverticulitis of large intestine without perforation or abscess without bleeding: Principal | ICD-10-CM

## 2016-09-04 DIAGNOSIS — Z8249 Family history of ischemic heart disease and other diseases of the circulatory system: Secondary | ICD-10-CM | POA: Diagnosis not present

## 2016-09-04 DIAGNOSIS — E669 Obesity, unspecified: Secondary | ICD-10-CM | POA: Diagnosis present

## 2016-09-04 DIAGNOSIS — Z794 Long term (current) use of insulin: Secondary | ICD-10-CM | POA: Diagnosis not present

## 2016-09-04 DIAGNOSIS — Z91048 Other nonmedicinal substance allergy status: Secondary | ICD-10-CM | POA: Diagnosis not present

## 2016-09-04 DIAGNOSIS — Z6836 Body mass index (BMI) 36.0-36.9, adult: Secondary | ICD-10-CM

## 2016-09-04 DIAGNOSIS — I10 Essential (primary) hypertension: Secondary | ICD-10-CM | POA: Diagnosis present

## 2016-09-04 DIAGNOSIS — Z8639 Personal history of other endocrine, nutritional and metabolic disease: Secondary | ICD-10-CM | POA: Diagnosis not present

## 2016-09-04 DIAGNOSIS — Z833 Family history of diabetes mellitus: Secondary | ICD-10-CM

## 2016-09-04 DIAGNOSIS — K5792 Diverticulitis of intestine, part unspecified, without perforation or abscess without bleeding: Secondary | ICD-10-CM | POA: Diagnosis present

## 2016-09-04 DIAGNOSIS — IMO0001 Reserved for inherently not codable concepts without codable children: Secondary | ICD-10-CM

## 2016-09-04 DIAGNOSIS — Z23 Encounter for immunization: Secondary | ICD-10-CM | POA: Diagnosis not present

## 2016-09-04 DIAGNOSIS — B373 Candidiasis of vulva and vagina: Secondary | ICD-10-CM | POA: Diagnosis not present

## 2016-09-04 DIAGNOSIS — E039 Hypothyroidism, unspecified: Secondary | ICD-10-CM | POA: Diagnosis present

## 2016-09-04 DIAGNOSIS — E1165 Type 2 diabetes mellitus with hyperglycemia: Secondary | ICD-10-CM

## 2016-09-04 DIAGNOSIS — IMO0002 Reserved for concepts with insufficient information to code with codable children: Secondary | ICD-10-CM | POA: Diagnosis present

## 2016-09-04 DIAGNOSIS — R1032 Left lower quadrant pain: Secondary | ICD-10-CM | POA: Diagnosis present

## 2016-09-04 DIAGNOSIS — D649 Anemia, unspecified: Secondary | ICD-10-CM | POA: Diagnosis present

## 2016-09-04 DIAGNOSIS — K5730 Diverticulosis of large intestine without perforation or abscess without bleeding: Secondary | ICD-10-CM

## 2016-09-04 DIAGNOSIS — Z7984 Long term (current) use of oral hypoglycemic drugs: Secondary | ICD-10-CM | POA: Diagnosis not present

## 2016-09-04 HISTORY — DX: Adverse effect of unspecified anesthetic, initial encounter: T41.45XA

## 2016-09-04 HISTORY — DX: Pneumonia, unspecified organism: J18.9

## 2016-09-04 HISTORY — DX: Other complications of anesthesia, initial encounter: T88.59XA

## 2016-09-04 HISTORY — DX: Type 2 diabetes mellitus without complications: E11.9

## 2016-09-04 LAB — CBC
HEMATOCRIT: 35.2 % — AB (ref 36.0–46.0)
HEMOGLOBIN: 11.8 g/dL — AB (ref 12.0–15.0)
MCH: 28.4 pg (ref 26.0–34.0)
MCHC: 33.5 g/dL (ref 30.0–36.0)
MCV: 84.6 fL (ref 78.0–100.0)
Platelets: 182 10*3/uL (ref 150–400)
RBC: 4.16 MIL/uL (ref 3.87–5.11)
RDW: 15.1 % (ref 11.5–15.5)
WBC: 8.2 10*3/uL (ref 4.0–10.5)

## 2016-09-04 LAB — COMPREHENSIVE METABOLIC PANEL
ALBUMIN: 3.9 g/dL (ref 3.5–5.0)
ALK PHOS: 67 U/L (ref 38–126)
ALT: 54 U/L (ref 14–54)
ANION GAP: 9 (ref 5–15)
AST: 30 U/L (ref 15–41)
BUN: 8 mg/dL (ref 6–20)
CALCIUM: 9.1 mg/dL (ref 8.9–10.3)
CO2: 23 mmol/L (ref 22–32)
Chloride: 104 mmol/L (ref 101–111)
Creatinine, Ser: 0.79 mg/dL (ref 0.44–1.00)
GFR calc Af Amer: >60 mL/min (ref >60)
GFR calc non Af Amer: >60 mL/min (ref >60)
GLUCOSE: 128 mg/dL — AB (ref 65–99)
Potassium: 3.9 mmol/L (ref 3.5–5.1)
SODIUM: 136 mmol/L (ref 135–145)
Total Bilirubin: 1.2 mg/dL (ref 0.3–1.2)
Total Protein: 7.6 g/dL (ref 6.5–8.1)

## 2016-09-04 LAB — URINALYSIS, ROUTINE W REFLEX MICROSCOPIC
BILIRUBIN URINE: NEGATIVE
Glucose, UA: NEGATIVE mg/dL
HGB URINE DIPSTICK: NEGATIVE
Ketones, ur: NEGATIVE mg/dL
Nitrite: NEGATIVE
PH: 5 (ref 5.0–8.0)
Protein, ur: NEGATIVE mg/dL
SPECIFIC GRAVITY, URINE: 1.02 (ref 1.005–1.030)

## 2016-09-04 LAB — URINE MICROSCOPIC-ADD ON
Bacteria, UA: NONE SEEN
RBC / HPF: NONE SEEN RBC/hpf (ref 0–5)

## 2016-09-04 LAB — GLUCOSE, CAPILLARY: Glucose-Capillary: 116 mg/dL — ABNORMAL HIGH (ref 65–99)

## 2016-09-04 LAB — LIPASE, BLOOD: Lipase: 26 U/L (ref 11–51)

## 2016-09-04 MED ORDER — METRONIDAZOLE IN NACL 5-0.79 MG/ML-% IV SOLN: 500.0000 mg | Freq: Once | INTRAVENOUS | Status: DC

## 2016-09-04 MED ORDER — POTASSIUM CHLORIDE IN NACL 20-0.9 MEQ/L-% IV SOLN
INTRAVENOUS | Status: DC
  Administered 2016-09-04: 20:00:00 via INTRAVENOUS
  Filled 2016-09-04: qty 1000

## 2016-09-04 MED ORDER — LOSARTAN POTASSIUM 50 MG PO TABS
25.0000 mg | ORAL_TABLET | Freq: Every day | ORAL | Status: DC
  Administered 2016-09-04 – 2016-09-09 (×6): 25 mg via ORAL
  Filled 2016-09-04 (×6): qty 1

## 2016-09-04 MED ORDER — ACETAMINOPHEN 650 MG RE SUPP: 650.0000 mg | Freq: Four times a day (QID) | RECTAL | Status: DC | PRN

## 2016-09-04 MED ORDER — ONDANSETRON HCL 4 MG/2ML IJ SOLN: 4.0000 mg | Freq: Four times a day (QID) | INTRAMUSCULAR | Status: DC | PRN

## 2016-09-04 MED ORDER — PIPERACILLIN-TAZOBACTAM 3.375 G IVPB 30 MIN
3.3750 g | Freq: Once | INTRAVENOUS | Status: AC
  Administered 2016-09-04: 3.375 g via INTRAVENOUS
  Filled 2016-09-04: qty 50

## 2016-09-04 MED ORDER — SODIUM CHLORIDE 0.9 % IV BOLUS (SEPSIS)
1000.0000 mL | Freq: Once | INTRAVENOUS | Status: AC
  Administered 2016-09-04: 1000 mL via INTRAVENOUS

## 2016-09-04 MED ORDER — ONDANSETRON HCL 4 MG PO TABS: 4.0000 mg | ORAL_TABLET | Freq: Four times a day (QID) | ORAL | Status: DC | PRN

## 2016-09-04 MED ORDER — CIPROFLOXACIN IN D5W 400 MG/200ML IV SOLN
400.0000 mg | Freq: Once | INTRAVENOUS | Status: DC
  Administered 2016-09-04: 400 mg via INTRAVENOUS
  Filled 2016-09-04: qty 200

## 2016-09-04 MED ORDER — ENOXAPARIN SODIUM 40 MG/0.4ML ~~LOC~~ SOLN
40.0000 mg | SUBCUTANEOUS | Status: DC
  Administered 2016-09-04 – 2016-09-08 (×5): 40 mg via SUBCUTANEOUS
  Filled 2016-09-04 (×5): qty 0.4

## 2016-09-04 MED ORDER — IOPAMIDOL (ISOVUE-300) INJECTION 61%
INTRAVENOUS | Status: AC
  Administered 2016-09-04: 100 mL
  Filled 2016-09-04: qty 100

## 2016-09-04 MED ORDER — INSULIN ASPART 100 UNIT/ML ~~LOC~~ SOLN: 0.0000 [IU] | Freq: Every day | SUBCUTANEOUS | Status: DC

## 2016-09-04 MED ORDER — INSULIN ASPART 100 UNIT/ML ~~LOC~~ SOLN
0.0000 [IU] | Freq: Three times a day (TID) | SUBCUTANEOUS | Status: DC
  Administered 2016-09-05 – 2016-09-09 (×4): 2 [IU] via SUBCUTANEOUS

## 2016-09-04 MED ORDER — KETOROLAC TROMETHAMINE 30 MG/ML IJ SOLN
30.0000 mg | Freq: Four times a day (QID) | INTRAMUSCULAR | Status: DC | PRN
  Administered 2016-09-04 – 2016-09-05 (×2): 30 mg via INTRAVENOUS
  Filled 2016-09-04 (×2): qty 1

## 2016-09-04 MED ORDER — PIPERACILLIN-TAZOBACTAM 3.375 G IVPB
3.3750 g | Freq: Three times a day (TID) | INTRAVENOUS | Status: DC
  Administered 2016-09-05 – 2016-09-08 (×10): 3.375 g via INTRAVENOUS
  Filled 2016-09-04 (×13): qty 50

## 2016-09-04 MED ORDER — IOPAMIDOL (ISOVUE-300) INJECTION 61%
INTRAVENOUS | Status: AC
  Filled 2016-09-04: qty 100

## 2016-09-04 MED ORDER — ACETAMINOPHEN 325 MG PO TABS
650.0000 mg | ORAL_TABLET | Freq: Four times a day (QID) | ORAL | Status: DC | PRN
  Administered 2016-09-07 – 2016-09-08 (×5): 650 mg via ORAL
  Filled 2016-09-04 (×5): qty 2

## 2016-09-04 MED ORDER — FENTANYL CITRATE (PF) 100 MCG/2ML IJ SOLN
50.0000 ug | Freq: Once | INTRAMUSCULAR | Status: AC
  Administered 2016-09-04: 50 ug via INTRAVENOUS
  Filled 2016-09-04: qty 2

## 2016-09-04 MED ORDER — ONDANSETRON HCL 4 MG/2ML IJ SOLN
4.0000 mg | Freq: Once | INTRAMUSCULAR | Status: AC
  Administered 2016-09-04: 4 mg via INTRAVENOUS
  Filled 2016-09-04: qty 2

## 2016-09-04 NOTE — ED Notes (Signed)
ED Provider at bedside. 

## 2016-09-04 NOTE — ED Notes (Signed)
Care handoff to Ruth RN 

## 2016-09-04 NOTE — ED Notes (Signed)
Patient transported to CT 

## 2016-09-04 NOTE — ED Triage Notes (Signed)
Onset 09-01-16 left sided abd pain, intermittant, every 20 minutes, lasting several minutes, getting worse, radiating to left flank, and dysuria.  Pt was seen at PCP yesterday, was given ABX for possible diverticulitis.  Pt has been on several rounds of ABX since July for UTI and diverticulitis.

## 2016-09-04 NOTE — H&P (Signed)
History and Physical    Jessica Pace DOB: 04-09-1961 DOA: 09/04/2016  Referring MD/NP/PA: er PCP: Harland Dingwall, NP Outpatient Specialists:  Patient coming from: home  Chief Complaint: abdominal pain  HPI: Jessica Pace is a 55 y.o. female with medical history significant of diverticulitis, DM and HTN. Patient has been on several rounds of antibiotics since July for both UTI diverticulitis. Most recently she's finished a 10 day course of Cipro and Flagyl on October 30.  The antibiotics were based on a CT scan done as an outpatient on October 20 that showed diverticulitis. After finishing the course antibiotics patient felt somewhat better but On Thursday patient developed left-sided abdominal pain lasting several minutes at a time radiating to her left flank. Patient was seen by her PCP and given Cipro Flagyl again. Pain worsened and she came to the ER. No fevers. Patient has had intermittent diarrhea. No blood in her stools. Some nausea but no vomiting.  ED Course: In the ER patient had another CAT scan that showed diverticulitis with possible early abscess formation. Hospitalists were called to admit for IV antibiotics. I did ask ER doctor to page system general surgery as well to follow along with patient in case the fluid seen on CAT scan needs drainage.    Review of Systems: all systems reviewed, negative unless stated above in HPI   Past Medical History:  Diagnosis Date  . Diabetes mellitus without complication ([place])   . Hypertension     Past Surgical History:  Procedure Laterality Date  . ABDOMINAL HYSTERECTOMY  1998     reports that she has never smoked. She has never used smokeless tobacco. She reports that she does not drink alcohol or use drugs.  Allergies  Allergen Reactions  . Butyl Acetate Other (See Comments)    In paints and nail polish- unknown    Family History  Problem Relation Age of Onset  . Diabetes Mother   . Hyperlipidemia Father   .  Hypertension Father   . Heart disease Maternal Grandmother      Prior to Admission medications   Medication Sig Start Date End Date Taking? Authorizing Provider  Ascorbic Acid (VITAMIN C) 1000 MG tablet Take 1,000 mg by mouth daily.   Yes Historical Provider, MD  ciprofloxacin (CIPRO) 500 MG tablet Take 500 mg by mouth 2 (two) times daily. 11/19, for 7 days ending 11/26 08/05/16  Yes Historical Provider, MD  Cod Liver Oil CAPS Take 1 capsule by mouth every morning.   Yes Historical Provider, MD  Dulaglutide (TRULICITY) [id number] [id number] SOPN Inject 1 application into the skin once a week. Patient taking differently: Inject 1 application into the skin every Thursday.  03/01/16  Yes David S Tysinger, PA-C  GARLIC OIL PO Take 1 tablet by mouth daily.   Yes Historical Provider, MD  losartan (COZAAR) 25 MG tablet Take 1 tablet (25 mg total) by mouth daily. 02/29/16  Yes Camelia Eng Tysinger, PA-C  metFORMIN (GLUCOPHAGE) 1000 MG tablet Take 1 tablet (1,000 mg total) by mouth daily with breakfast. 03/01/16  Yes Camelia Eng Tysinger, PA-C  metroNIDAZOLE (FLAGYL) 500 MG tablet Take 500 mg by mouth 4 (four) times daily. Started 11/19, for 7 days ending 11/26 08/05/16  Yes Historical Provider, MD  polyethylene glycol (MIRALAX / GLYCOLAX) packet Take 17 g by mouth daily as needed for moderate constipation.   Yes Historical Provider, MD  cetirizine (ZYRTEC) 10 MG tablet Take 1 tablet (10 mg total) by mouth daily. Patient not taking:  Reported on 09/04/2016 02/29/16   Camelia Eng Tysinger, PA-C  ondansetron (ZOFRAN) 4 MG tablet Take 1 tablet (4 mg total) by mouth every 6 (six) hours. Patient not taking: Reported on 09/04/2016 08/10/16   Bea Graff Law, PA-C  terbinafine (LAMISIL AT) 1 % cream Apply 1 application topically 2 (two) times daily. In between breasts Patient not taking: Reported on 09/04/2016 02/29/16   Carlena Hurl, PA-C    Physical Exam:     Constitutional: uncomfortable appearing Vitals:   09/04/16  1517 09/04/16 1744 09/04/16 1828  BP: 128/76 120/74 138/76  Pulse: 84 68 70  Resp: 14 16 16   Temp: 99.4 F (37.4 C)    TempSrc: Oral    SpO2: 98% 100% 100%  Weight: 97.1 kg (214 lb)    Height: 5\' 4"  (1.626 m)     Eyes: PERRL, lids and conjunctivae normal ENMT: Mucous membranes are moist. Posterior pharynx clear of any exudate or lesions.Normal dentition.  Neck: normal, supple, no masses, no thyromegaly Respiratory: clear to auscultation bilaterally, no wheezing, no crackles. Normal respiratory effort. No accessory muscle use.  Cardiovascular: Regular rate and rhythm, no murmurs / rubs / gallops. No extremity edema. 2+ pedal pulses. No carotid bruits.  Abdomen: + tenderness in RUQ as well as LLQ, no rebound tenderness. Bowel sounds positive.  Musculoskeletal: no clubbing / cyanosis. No joint deformity upper and lower extremities. Good ROM, no contractures. Normal muscle tone.  Skin: no rashes, lesions, ulcers. No induration Neurologic: CN 2-12 grossly intact. Sensation intact, DTR normal. Strength 5/5 in all 4.  Psychiatric: Normal judgment and insight. Alert and oriented x 3. Normal mood.     Labs on Admission: I have personally reviewed following labs and imaging studies  CBC:  Recent Labs Lab 09/04/16 1519  WBC 8.2  HGB 11.8*  HCT 35.2*  MCV 84.6  PLT [id number]   Basic Metabolic Panel:  Recent Labs Lab 09/04/16 1519  NA 136  K 3.9  CL 104  CO2 23  GLUCOSE 128*  BUN 8  CREATININE 0.79  CALCIUM 9.1   GFR: Estimated Creatinine Clearance: 87.8 mL/min (by C-G formula based on SCr of 0.79 mg/dL). Liver Function Tests:  Recent Labs Lab 09/04/16 1519  AST 30  ALT 54  ALKPHOS 67  BILITOT 1.2  PROT 7.6  ALBUMIN 3.9    Recent Labs Lab 09/04/16 1519  LIPASE 26   No results for input(s): AMMONIA in the last 168 hours. Coagulation Profile: No results for input(s): INR, PROTIME in the last 168 hours. Cardiac Enzymes: No results for input(s): CKTOTAL, CKMB,  CKMBINDEX, TROPONINI in the last 168 hours. BNP (last 3 results) No results for input(s): PROBNP in the last 8760 hours. HbA1C: No results for input(s): HGBA1C in the last 72 hours. CBG: No results for input(s): GLUCAP in the last 168 hours. Lipid Profile: No results for input(s): CHOL, HDL, LDLCALC, TRIG, CHOLHDL, LDLDIRECT in the last 72 hours. Thyroid Function Tests: No results for input(s): TSH, T4TOTAL, FREET4, T3FREE, THYROIDAB in the last 72 hours. Anemia Panel: No results for input(s): VITAMINB12, FOLATE, FERRITIN, TIBC, IRON, RETICCTPCT in the last 72 hours. Urine analysis:    Component Value Date/Time   COLORURINE AMBER (A) 09/04/2016 1543   APPEARANCEUR CLOUDY (A) 09/04/2016 1543   LABSPEC 1.020 09/04/2016 1543   PHURINE 5.0 09/04/2016 1543   GLUCOSEU NEGATIVE 09/04/2016 1543   HGBUR NEGATIVE 09/04/2016 1543   BILIRUBINUR NEGATIVE 09/04/2016 1543   BILIRUBINUR n 09/01/2015 0920   KETONESUR NEGATIVE  09/04/2016 1543   PROTEINUR NEGATIVE 09/04/2016 1543   UROBILINOGEN negative 09/01/2015 0920   UROBILINOGEN 0.2 07/26/2015 2052   NITRITE NEGATIVE 09/04/2016 1543   LEUKOCYTESUR MODERATE (A) 09/04/2016 1543   Sepsis Labs: Invalid input(s): PROCALCITONIN, LACTICIDVEN No results found for this or any previous visit (from the past 240 hour(s)).   Radiological Exams on Admission: Ct Abdomen Pelvis W Contrast  Result Date: 09/04/2016 CLINICAL DATA:  Three-day history of abdominal pain, primarily left-sided EXAM: CT ABDOMEN AND PELVIS WITH CONTRAST TECHNIQUE: Multidetector CT imaging of the abdomen and pelvis was performed using the standard protocol following bolus administration of intravenous contrast. CONTRAST:  183mL ISOVUE-300 IOPAMIDOL (ISOVUE-300) INJECTION 61% COMPARISON:  None. FINDINGS: Lower chest: There is mild atelectatic change in the posterior right lung base. Lung bases are otherwise clear. Hepatobiliary: Liver measures 19.3 cm in length. No focal liver lesions  are evident. Gallbladder wall is not appreciably thickened. There is no biliary duct dilatation. Pancreas: There is no pancreatic mass or inflammatory focus. Spleen: No splenic lesions are evident. There is a small splenule medial to the anterior spleen. Adrenals/Urinary Tract: Adrenals appear normal bilaterally. Kidneys bilaterally show no mass or hydronephrosis on either side. There is no renal or ureteral calculus on either side. Urinary bladder is midline with wall thickness within normal limits. Stomach/Bowel: There is wall thickening in the midportion of the sigmoid colon with surrounding mesenteric thickening consistent with diverticulitis. There is slightly irregular fluid within the lumen of the colon in this area without frank abscess. The earliest changes of phlegmon formation in this area must be of concern, however. No perforation is evident. Elsewhere, there are diverticula in the descending colon without diverticulitis in this area. No other bowel wall or mesenteric thickening is evident. There is no bowel obstruction. No free air or portal venous air evident. Vascular/Lymphatic: There is atherosclerotic calcification in the aorta and common iliac arteries without aneurysm. The major mesenteric vessels appear patent. By size criteria, there is no adenopathy in the abdomen or pelvis. There are multiple subcentimeter retroperitoneal lymph nodes, regarded as nonspecific. These lymph nodes may well be reactive in etiology due to the diverticulitis. Reproductive: Uterus is absent. No pelvic mass present. A small amount of mildly loculated fluid is noted just anterior to the rectum on the left. Other: The appendix appears normal. There is no abscess or free fluid evident abdomen or pelvis. There is a rather minimal ventral hernia containing only fat. Musculoskeletal: There is degenerative change in the lumbar spine. There is upper lumbar levoscoliosis. There are no blastic or lytic bone lesions. There is no  intramuscular or abdominal wall lesion. IMPRESSION: Midportion sigmoid diverticulitis with wall and mesenteric thickening in this area. Ill-defined fluid in this region potentially could represent earliest phlegmon development. No frank abscess. No perforation evident. Elsewhere, there are multiple diverticula throughout the descending colon without diverticulitis in this region. Small amount of mildly loculated fluid in the cul-de-sac. This fluid may arise secondary to the nearby diverticulitis but also be due to recent ovarian cyst rupture. By size criteria, there is no adenopathy. Multiple small retroperitoneal lymph nodes are present which may well have reactive etiology due to diverticulitis. Liver prominent without focal lesion. Appendix appears normal. No bowel obstruction. No abscess. No renal or ureteral calculus. No hydronephrosis. Electronically Signed   By: Lowella Grip III M.D.   On: 09/04/2016 17:29      Assessment/Plan Active Problems:   Obesity (BMI 30-39.9)   Diabetes type 2, uncontrolled ([place])  Essential hypertension   Diverticulitis   Diverticulitis with possible phlegmon/abscess formation early Will start on IV Zosyn as patient has been on several rounds of by mouth antibiotics including Cipro Flagyl within the last couple weeks Gen. surgery consultation Clears today and then nothing by mouth after midnight  Diabetes Hold home medications Sliding-scale insulin  Hypertension Continue home blood pressure medication  Obesity Encouraged weight loss  DVT prophylaxis: lovenox Code Status: full Family Communication: Family at bedside  Disposition Plan: Inpatient for IV antibiotics suspect patient will be here greater than 48 hours Consults called: Dr. Otelia Limes. surgery Admission status: Inpatient for IV antibiotics   [place] DO [facility] Pager [phone]  If 7PM-7AM, please contact night-coverage [email or web address] Password  [email or web address]  09/04/2016, 6:50 PM

## 2016-09-04 NOTE — ED Provider Notes (Signed)
[facility] DEPT Provider Note   CSN: [id number] Arrival date & time: 09/04/16  1431     History   Chief Complaint Chief Complaint  Patient presents with  . Abdominal Pain    HPI Jessica Pace is a 55 y.o. female.  Patient is a 55 year old female with a history of diabetes and hypertension. She's had a 4 month history of left-sided abdominal pain. Initially she was treated for UTIs in her symptoms did not improve. She had a CT scan done as an outpatient October 20. Patient reports that the CT scan showed diverticulitis and patient was started on Cipro and Flagyl. She still has had no improvement in symptoms. She was seen at [facility] urgent care yesterday and started back on the Cipro and Flagyl. She's continuing to have worsening pain in her left mid and lower abdomen. There is no back pain. She has a little bit of discomfort on urination. She has a little bit of discomfort with bowel movements. She has intermittent diarrhea. No blood in her stool. No vaginal bleeding or discharge. No fevers. No nausea or vomiting.       Past Medical History:  Diagnosis Date  . Diabetes mellitus without complication ([place])   . Hypertension     Patient Active Problem List   Diagnosis Date Noted  . Diverticulitis 09/04/2016  . History of hypothyroidism 09/01/2015  . Obesity (BMI 30-39.9) 08/10/2015  . Diabetes type 2, uncontrolled ([place]) 08/10/2015  . Essential hypertension 08/10/2015  . History of vitamin D deficiency 08/10/2015  . History of headache 08/10/2015    Past Surgical History:  Procedure Laterality Date  . ABDOMINAL HYSTERECTOMY  1998    OB History    No data available       Home Medications    Prior to Admission medications   Medication Sig Start Date End Date Taking? Authorizing Provider  Ascorbic Acid (VITAMIN C) 1000 MG tablet Take 1,000 mg by mouth daily.   Yes Historical Provider, MD  ciprofloxacin (CIPRO) 500 MG tablet Take 500 mg by mouth 2  (two) times daily. 11/19, for 7 days ending 11/26 08/05/16  Yes Historical Provider, MD  Cod Liver Oil CAPS Take 1 capsule by mouth every morning.   Yes Historical Provider, MD  Dulaglutide (TRULICITY) [id number] SOPN Inject 1 application into the skin once a week. Patient taking differently: Inject 1 application into the skin every Thursday.  03/01/16  Yes David S Tysinger, PA-C  GARLIC OIL PO Take 1 tablet by mouth daily.   Yes Historical Provider, MD  losartan (COZAAR) 25 MG tablet Take 1 tablet (25 mg total) by mouth daily. 02/29/16  Yes Camelia Eng Tysinger, PA-C  metFORMIN (GLUCOPHAGE) 1000 MG tablet Take 1 tablet (1,000 mg total) by mouth daily with breakfast. 03/01/16  Yes Camelia Eng Tysinger, PA-C  metroNIDAZOLE (FLAGYL) 500 MG tablet Take 500 mg by mouth 4 (four) times daily. Started 11/19, for 7 days ending 11/26 08/05/16  Yes Historical Provider, MD  polyethylene glycol (MIRALAX / GLYCOLAX) packet Take 17 g by mouth daily as needed for moderate constipation.   Yes Historical Provider, MD  cetirizine (ZYRTEC) 10 MG tablet Take 1 tablet (10 mg total) by mouth daily. Patient not taking: Reported on 09/04/2016 02/29/16   Camelia Eng Tysinger, PA-C  ondansetron (ZOFRAN) 4 MG tablet Take 1 tablet (4 mg total) by mouth every 6 (six) hours. Patient not taking: Reported on 09/04/2016 08/10/16   Bea Graff Law, PA-C  terbinafine (LAMISIL AT) 1 %  cream Apply 1 application topically 2 (two) times daily. In between breasts Patient not taking: Reported on 09/04/2016 02/29/16   Carlena Hurl, PA-C    Family History Family History  Problem Relation Age of Onset  . Diabetes Mother   . Hyperlipidemia Father   . Hypertension Father   . Heart disease Maternal Grandmother     Social History Social History  Substance Use Topics  . Smoking status: Never Smoker  . Smokeless tobacco: Never Used  . Alcohol use No     Allergies   Butyl acetate   Review of Systems Review of Systems  Constitutional:  Positive for fatigue. Negative for chills, diaphoresis and fever.  HENT: Negative for congestion, rhinorrhea and sneezing.   Eyes: Negative.   Respiratory: Negative for cough, chest tightness and shortness of breath.   Cardiovascular: Negative for chest pain and leg swelling.  Gastrointestinal: Positive for abdominal pain and diarrhea. Negative for blood in stool, nausea and vomiting.  Genitourinary: Negative for difficulty urinating, flank pain, frequency, hematuria, vaginal bleeding and vaginal discharge.  Musculoskeletal: Negative for arthralgias and back pain.  Skin: Negative for rash.  Neurological: Negative for dizziness, speech difficulty, weakness, numbness and headaches.     Physical Exam Updated Vital Signs BP 138/76   Pulse 70   Temp 99.4 F (37.4 C) (Oral)   Resp 16   Ht 5\' 4"  (1.626 m)   Wt 214 lb (97.1 kg) Comment: pt was weighed at PCP office yesterday  SpO2 100%   BMI 36.73 kg/m   Physical Exam  Constitutional: She is oriented to person, place, and time. She appears well-developed and well-nourished.  HENT:  Head: Normocephalic and atraumatic.  Eyes: Pupils are equal, round, and reactive to light.  Neck: Normal range of motion. Neck supple.  Cardiovascular: Normal rate, regular rhythm and normal heart sounds.   Pulmonary/Chest: Effort normal and breath sounds normal. No respiratory distress. She has no wheezes. She has no rales. She exhibits no tenderness.  Abdominal: Soft. Bowel sounds are normal. There is tenderness (moderate TTP left mid and upper abdomen, less TTP LLQ, mild suprapubic tenderness). There is no rebound and no guarding.  Musculoskeletal: Normal range of motion. She exhibits no edema.  Lymphadenopathy:    She has no cervical adenopathy.  Neurological: She is alert and oriented to person, place, and time.  Skin: Skin is warm and dry. No rash noted.  Psychiatric: She has a normal mood and affect.     ED Treatments / Results  Labs (all labs  ordered are listed, but only abnormal results are displayed) Labs Reviewed  COMPREHENSIVE METABOLIC PANEL - Abnormal; Notable for the following:       Result Value   Glucose, Bld 128 (*)    All other components within normal limits  CBC - Abnormal; Notable for the following:    Hemoglobin 11.8 (*)    HCT 35.2 (*)    All other components within normal limits  URINALYSIS, ROUTINE W REFLEX MICROSCOPIC (NOT AT [facility]) - Abnormal; Notable for the following:    Color, Urine AMBER (*)    APPearance CLOUDY (*)    Leukocytes, UA MODERATE (*)    All other components within normal limits  URINE MICROSCOPIC-ADD ON - Abnormal; Notable for the following:    Squamous Epithelial / LPF 6-30 (*)    All other components within normal limits  URINE CULTURE  LIPASE, BLOOD    EKG  EKG Interpretation None  Radiology Ct Abdomen Pelvis W Contrast  Result Date: 09/04/2016 CLINICAL DATA:  Three-day history of abdominal pain, primarily left-sided EXAM: CT ABDOMEN AND PELVIS WITH CONTRAST TECHNIQUE: Multidetector CT imaging of the abdomen and pelvis was performed using the standard protocol following bolus administration of intravenous contrast. CONTRAST:  154mL ISOVUE-300 IOPAMIDOL (ISOVUE-300) INJECTION 61% COMPARISON:  None. FINDINGS: Lower chest: There is mild atelectatic change in the posterior right lung base. Lung bases are otherwise clear. Hepatobiliary: Liver measures 19.3 cm in length. No focal liver lesions are evident. Gallbladder wall is not appreciably thickened. There is no biliary duct dilatation. Pancreas: There is no pancreatic mass or inflammatory focus. Spleen: No splenic lesions are evident. There is a small splenule medial to the anterior spleen. Adrenals/Urinary Tract: Adrenals appear normal bilaterally. Kidneys bilaterally show no mass or hydronephrosis on either side. There is no renal or ureteral calculus on either side. Urinary bladder is midline with wall thickness within normal  limits. Stomach/Bowel: There is wall thickening in the midportion of the sigmoid colon with surrounding mesenteric thickening consistent with diverticulitis. There is slightly irregular fluid within the lumen of the colon in this area without frank abscess. The earliest changes of phlegmon formation in this area must be of concern, however. No perforation is evident. Elsewhere, there are diverticula in the descending colon without diverticulitis in this area. No other bowel wall or mesenteric thickening is evident. There is no bowel obstruction. No free air or portal venous air evident. Vascular/Lymphatic: There is atherosclerotic calcification in the aorta and common iliac arteries without aneurysm. The major mesenteric vessels appear patent. By size criteria, there is no adenopathy in the abdomen or pelvis. There are multiple subcentimeter retroperitoneal lymph nodes, regarded as nonspecific. These lymph nodes may well be reactive in etiology due to the diverticulitis. Reproductive: Uterus is absent. No pelvic mass present. A small amount of mildly loculated fluid is noted just anterior to the rectum on the left. Other: The appendix appears normal. There is no abscess or free fluid evident abdomen or pelvis. There is a rather minimal ventral hernia containing only fat. Musculoskeletal: There is degenerative change in the lumbar spine. There is upper lumbar levoscoliosis. There are no blastic or lytic bone lesions. There is no intramuscular or abdominal wall lesion. IMPRESSION: Midportion sigmoid diverticulitis with wall and mesenteric thickening in this area. Ill-defined fluid in this region potentially could represent earliest phlegmon development. No frank abscess. No perforation evident. Elsewhere, there are multiple diverticula throughout the descending colon without diverticulitis in this region. Small amount of mildly loculated fluid in the cul-de-sac. This fluid may arise secondary to the nearby  diverticulitis but also be due to recent ovarian cyst rupture. By size criteria, there is no adenopathy. Multiple small retroperitoneal lymph nodes are present which may well have reactive etiology due to diverticulitis. Liver prominent without focal lesion. Appendix appears normal. No bowel obstruction. No abscess. No renal or ureteral calculus. No hydronephrosis. Electronically Signed   By: Lowella Grip III M.D.   On: 09/04/2016 17:29    Procedures Procedures (including critical care time)  Medications Ordered in ED Medications  ciprofloxacin (CIPRO) IVPB 400 mg (400 mg Intravenous New Bag/Given 09/04/16 1827)  metroNIDAZOLE (FLAGYL) IVPB 500 mg (not administered)  sodium chloride 0.9 % bolus 1,000 mL (0 mLs Intravenous Stopped 09/04/16 1744)  fentaNYL (SUBLIMAZE) injection 50 mcg (50 mcg Intravenous Given 09/04/16 1613)  ondansetron (ZOFRAN) injection 4 mg (4 mg Intravenous Given 09/04/16 1613)  iopamidol (ISOVUE-300) 61 % injection (100  mLs  Contrast Given 09/04/16 1702)     Initial Impression / Assessment and Plan / ED Course  I have reviewed the triage vital signs and the nursing notes.  Pertinent labs & imaging results that were available during my care of the patient were reviewed by me and considered in my medical decision making (see chart for details).  Clinical Course     Patient has evidence of diverticulitis on CT with early abscess formation. She recently finished a 10 day course of antibiotics on October 30. Her symptoms recurred and she started the antibiotics back again yesterday. She's had 2 doses. Given her recurrent symptoms and early abscess formation, I feel that it will be better if we admit her for  IV antibiotics and obs. I will consult the hospitalist for admission. Patient's PCP is with [facility].  I spoke with Dr. Eliseo Squires who will admit the pt.  She requested surgical consult and I did speak with Dr. Ninfa Linden with [facility] surgery who  will see the patient as well.  Final Clinical Impressions(s) / ED Diagnoses   Final diagnoses:  Diverticula of colon    New Prescriptions New Prescriptions   No medications on file     Malvin Johns, MD 09/04/16 1839

## 2016-09-04 NOTE — Progress Notes (Signed)
Pharmacy Antibiotic Note  Jessica Pace is a 55 y.o. female admitted on 09/04/2016 with intra-abdominal infection.  Pharmacy has been consulted for Zosyn dosing.  Patient recently finished 10 day course of Cipro/Flagyl as an outpatient (based on CT done in October showing diverticulitis). Pain returned, PCP gave her Cipro/Flagyl again, but pain continued to worsen and she presented to ED. CT in ED today shows diverticulitis with possible abscess formation.   Plan: Zosyn 3.375g IV q8h (4 hour infusion).  Follow c/s, renal function, clinical progression, ability to de-escalate  Height: 5\' 4"  (162.6 cm) Weight: 214 lb (97.1 kg) (pt was weighed at PCP office yesterday) IBW/kg (Calculated) : 54.7  Temp (24hrs), Avg:99.4 F (37.4 C), Min:99.4 F (37.4 C), Max:99.4 F (37.4 C)   Recent Labs Lab 09/04/16 1519  WBC 8.2  CREATININE 0.79    Estimated Creatinine Clearance: 87.8 mL/min (by C-G formula based on SCr of 0.79 mg/dL).    Allergies  Allergen Reactions  . Butyl Acetate Other (See Comments)    In paints and nail polish- unknown    Antimicrobials this admission: Zosyn 11/19 >>   Dose adjustments this admission: n/a  Microbiology results: 11/19 UCx: sent    Thank you for allowing pharmacy to be a part of this patient's care.  Shaiann Mcmanamon D. Esiquio Boesen, PharmD, BCPS Clinical Pharmacist Pager: [phone] 09/04/2016 7:11 PM

## 2016-09-04 NOTE — ED Notes (Signed)
Family at bedside. 

## 2016-09-04 NOTE — ED Notes (Signed)
Hospitalist at bedside 

## 2016-09-04 NOTE — Consult Note (Signed)
Reason for Consult: Sigmoid diverticulitis Referring Physician: Dr. Eulogio Bear  Jessica Pace is an 55 y.o. female.  HPI: This is a pleasant 55 year old female who presents with sigmoid diverticulitis. Several weeks ago she had a bout of sigmoid diverticulitis. This was proven on CT scan done elsewhere. She is placed on Cipro and Flagyl and she reports that she completely resolved her discomfort. She has been off antibiotics for 2 weeks and now has had the return of moderate to severe sharp left lower quadrant abdominal pain. Bowel movements a been normal. There is no nausea or vomiting. She is otherwise without complaints. She believes her last colonoscopy was a proximally 5 years ago and was done in [place]. There is no family history of colon cancer  Past Medical History:  Diagnosis Date  . Diabetes mellitus without complication ([place])   . Hypertension     Past Surgical History:  Procedure Laterality Date  . ABDOMINAL HYSTERECTOMY  1998    Family History  Problem Relation Age of Onset  . Diabetes Mother   . Hyperlipidemia Father   . Hypertension Father   . Heart disease Maternal Grandmother     Social History:  reports that she has never smoked. She has never used smokeless tobacco. She reports that she does not drink alcohol or use drugs.  Allergies:  Allergies  Allergen Reactions  . Butyl Acetate Other (See Comments)    In paints and nail polish- unknown    Medications: I have reviewed the patient's current medications.  Results for orders placed or performed during the hospital encounter of 09/04/16 (from the past 48 hour(s))  Lipase, blood     Status: None   Collection Time: 09/04/16  3:19 PM  Result Value Ref Range   Lipase 26 11 - 51 U/L  Comprehensive metabolic panel     Status: Abnormal   Collection Time: 09/04/16  3:19 PM  Result Value Ref Range   Sodium 136 135 - 145 mmol/L   Potassium 3.9 3.5 - 5.1 mmol/L   Chloride 104 101 - 111 mmol/L   CO2 23 22 - 32  mmol/L   Glucose, Bld 128 (H) 65 - 99 mg/dL   BUN 8 6 - 20 mg/dL   Creatinine, Ser 0.79 0.44 - 1.00 mg/dL   Calcium 9.1 8.9 - 10.3 mg/dL   Total Protein 7.6 6.5 - 8.1 g/dL   Albumin 3.9 3.5 - 5.0 g/dL   AST 30 15 - 41 U/L   ALT 54 14 - 54 U/L   Alkaline Phosphatase 67 38 - 126 U/L   Total Bilirubin 1.2 0.3 - 1.2 mg/dL   GFR calc non Af Amer >60 >60 mL/min   GFR calc Af Amer >60 >60 mL/min    Comment: (NOTE) The eGFR has been calculated using the CKD EPI equation. This calculation has not been validated in all clinical situations. eGFR's persistently <60 mL/min signify possible Chronic Kidney Disease.    Anion gap 9 5 - 15  CBC     Status: Abnormal   Collection Time: 09/04/16  3:19 PM  Result Value Ref Range   WBC 8.2 4.0 - 10.5 K/uL   RBC 4.16 3.87 - 5.11 MIL/uL   Hemoglobin 11.8 (L) 12.0 - 15.0 g/dL   HCT 35.2 (L) 36.0 - 46.0 %   MCV 84.6 78.0 - 100.0 fL   MCH 28.4 26.0 - 34.0 pg   MCHC 33.5 30.0 - 36.0 g/dL   RDW 15.1 11.5 - 15.5 %  Platelets 182 150 - 400 K/uL  Urinalysis, Routine w reflex microscopic     Status: Abnormal   Collection Time: 09/04/16  3:43 PM  Result Value Ref Range   Color, Urine AMBER (A) YELLOW    Comment: BIOCHEMICALS MAY BE AFFECTED BY COLOR   APPearance CLOUDY (A) CLEAR   Specific Gravity, Urine 1.020 1.005 - 1.030   pH 5.0 5.0 - 8.0   Glucose, UA NEGATIVE NEGATIVE mg/dL   Hgb urine dipstick NEGATIVE NEGATIVE   Bilirubin Urine NEGATIVE NEGATIVE   Ketones, ur NEGATIVE NEGATIVE mg/dL   Protein, ur NEGATIVE NEGATIVE mg/dL   Nitrite NEGATIVE NEGATIVE   Leukocytes, UA MODERATE (A) NEGATIVE  Urine microscopic-add on     Status: Abnormal   Collection Time: 09/04/16  3:43 PM  Result Value Ref Range   Squamous Epithelial / LPF 6-30 (A) NONE SEEN   WBC, UA 6-30 0 - 5 WBC/hpf   RBC / HPF NONE SEEN 0 - 5 RBC/hpf   Bacteria, UA NONE SEEN NONE SEEN  Glucose, capillary     Status: Abnormal   Collection Time: 09/04/16  8:06 PM  Result Value Ref Range    Glucose-Capillary 116 (H) 65 - 99 mg/dL    Ct Abdomen Pelvis W Contrast  Result Date: 09/04/2016 CLINICAL DATA:  Three-day history of abdominal pain, primarily left-sided EXAM: CT ABDOMEN AND PELVIS WITH CONTRAST TECHNIQUE: Multidetector CT imaging of the abdomen and pelvis was performed using the standard protocol following bolus administration of intravenous contrast. CONTRAST:  153m ISOVUE-300 IOPAMIDOL (ISOVUE-300) INJECTION 61% COMPARISON:  None. FINDINGS: Lower chest: There is mild atelectatic change in the posterior right lung base. Lung bases are otherwise clear. Hepatobiliary: Liver measures 19.3 cm in length. No focal liver lesions are evident. Gallbladder wall is not appreciably thickened. There is no biliary duct dilatation. Pancreas: There is no pancreatic mass or inflammatory focus. Spleen: No splenic lesions are evident. There is a small splenule medial to the anterior spleen. Adrenals/Urinary Tract: Adrenals appear normal bilaterally. Kidneys bilaterally show no mass or hydronephrosis on either side. There is no renal or ureteral calculus on either side. Urinary bladder is midline with wall thickness within normal limits. Stomach/Bowel: There is wall thickening in the midportion of the sigmoid colon with surrounding mesenteric thickening consistent with diverticulitis. There is slightly irregular fluid within the lumen of the colon in this area without frank abscess. The earliest changes of phlegmon formation in this area must be of concern, however. No perforation is evident. Elsewhere, there are diverticula in the descending colon without diverticulitis in this area. No other bowel wall or mesenteric thickening is evident. There is no bowel obstruction. No free air or portal venous air evident. Vascular/Lymphatic: There is atherosclerotic calcification in the aorta and common iliac arteries without aneurysm. The major mesenteric vessels appear patent. By size criteria, there is no  adenopathy in the abdomen or pelvis. There are multiple subcentimeter retroperitoneal lymph nodes, regarded as nonspecific. These lymph nodes may well be reactive in etiology due to the diverticulitis. Reproductive: Uterus is absent. No pelvic mass present. A small amount of mildly loculated fluid is noted just anterior to the rectum on the left. Other: The appendix appears normal. There is no abscess or free fluid evident abdomen or pelvis. There is a rather minimal ventral hernia containing only fat. Musculoskeletal: There is degenerative change in the lumbar spine. There is upper lumbar levoscoliosis. There are no blastic or lytic bone lesions. There is no intramuscular or abdominal wall  lesion. IMPRESSION: Midportion sigmoid diverticulitis with wall and mesenteric thickening in this area. Ill-defined fluid in this region potentially could represent earliest phlegmon development. No frank abscess. No perforation evident. Elsewhere, there are multiple diverticula throughout the descending colon without diverticulitis in this region. Small amount of mildly loculated fluid in the cul-de-sac. This fluid may arise secondary to the nearby diverticulitis but also be due to recent ovarian cyst rupture. By size criteria, there is no adenopathy. Multiple small retroperitoneal lymph nodes are present which may well have reactive etiology due to diverticulitis. Liver prominent without focal lesion. Appendix appears normal. No bowel obstruction. No abscess. No renal or ureteral calculus. No hydronephrosis. Electronically Signed   By: Lowella Grip III M.D.   On: 09/04/2016 17:29    Review of Systems  Constitutional: Negative for chills, diaphoresis, fever and weight loss.  Respiratory: Negative for cough and shortness of breath.   Cardiovascular: Negative for chest pain and leg swelling.  Gastrointestinal: Positive for abdominal pain. Negative for blood in stool, melena, nausea and vomiting.  Genitourinary:  Negative for dysuria and urgency.  All other systems reviewed and are negative.  Blood pressure 128/67, pulse 67, temperature 98.9 F (37.2 C), resp. rate 18, height _0  (1.626 m), weight 97.1 kg (214 lb), SpO2 100 %. Physical Exam  Constitutional: She is oriented to person, place, and time. She appears well-developed and well-nourished. No distress.  HENT:  Head: Normocephalic and atraumatic.  Right Ear: External ear normal.  Left Ear: External ear normal.  Nose: Nose normal.  Mouth/Throat: Oropharynx is clear and moist. No oropharyngeal exudate.  Eyes: Conjunctivae are normal. Pupils are equal, round, and reactive to light. Right eye exhibits no discharge. Left eye exhibits no discharge. No scleral icterus.  Neck: Normal range of motion. No tracheal deviation present. No thyromegaly present.  Cardiovascular: Normal rate, regular rhythm, normal heart sounds and intact distal pulses.   No murmur heard. Respiratory: Effort normal and breath sounds normal. No respiratory distress. She exhibits no tenderness.  GI: Soft. She exhibits no distension. There is tenderness. There is guarding.  There is moderate tenderness with guarding in the left lower quadrant  Musculoskeletal: Normal range of motion. She exhibits no edema or tenderness.  Lymphadenopathy:    She has no cervical adenopathy.  Neurological: She is alert and oriented to person, place, and time.  Skin: Skin is warm. No rash noted. She is not diaphoretic. No erythema.  Psychiatric: Her behavior is normal. Judgment normal.    Assessment/Plan: Sigmoid diverticulitis  The patient has been admitted and started on IV antibiotics and bowel rest. Hopefully, she will improve without the need for surgical intervention. If her condition worsens, she may need a repeat CT scan versus an exploratory laparotomy. If she improves, she would need a follow-up visit with a gastroenterologist to consider a colonoscopy in 6-8 weeks. She will remain  npo tonight and we will follow her closely with you  Jessica Pace A 09/04/2016, 8:42 PM

## 2016-09-05 ENCOUNTER — Encounter (HOSPITAL_COMMUNITY): Payer: Self-pay | Admitting: General Practice

## 2016-09-05 LAB — GLUCOSE, CAPILLARY
GLUCOSE-CAPILLARY: 93 mg/dL (ref 65–99)
GLUCOSE-CAPILLARY: 90 mg/dL (ref 65–99)
Glucose-Capillary: 124 mg/dL — ABNORMAL HIGH (ref 65–99)
Glucose-Capillary: 83 mg/dL (ref 65–99)

## 2016-09-05 LAB — CBC
HEMATOCRIT: 32 % — AB (ref 36.0–46.0)
Hemoglobin: 10.5 g/dL — ABNORMAL LOW (ref 12.0–15.0)
MCH: 28.1 pg (ref 26.0–34.0)
MCHC: 32.8 g/dL (ref 30.0–36.0)
MCV: 85.6 fL (ref 78.0–100.0)
PLATELETS: 150 10*3/uL (ref 150–400)
RBC: 3.74 MIL/uL — AB (ref 3.87–5.11)
RDW: 15.6 % — ABNORMAL HIGH (ref 11.5–15.5)
WBC: 5.8 10*3/uL (ref 4.0–10.5)

## 2016-09-05 LAB — BASIC METABOLIC PANEL
Anion gap: 10 (ref 5–15)
BUN: 7 mg/dL (ref 6–20)
CHLORIDE: 107 mmol/L (ref 101–111)
CO2: 18 mmol/L — AB (ref 22–32)
CREATININE: 0.87 mg/dL (ref 0.44–1.00)
Calcium: 8.3 mg/dL — ABNORMAL LOW (ref 8.9–10.3)
GFR calc non Af Amer: >60 mL/min (ref >60)
Glucose, Bld: 106 mg/dL — ABNORMAL HIGH (ref 65–99)
POTASSIUM: 5 mmol/L (ref 3.5–5.1)
Sodium: 135 mmol/L (ref 135–145)

## 2016-09-05 LAB — URINE CULTURE

## 2016-09-05 MED ORDER — ORAL CARE MOUTH RINSE
15.0000 mL | Freq: Two times a day (BID) | OROMUCOSAL | Status: DC
  Administered 2016-09-05 – 2016-09-06 (×3): 15 mL via OROMUCOSAL

## 2016-09-05 MED ORDER — SODIUM CHLORIDE 0.9 % IV SOLN
INTRAVENOUS | Status: DC
  Administered 2016-09-05 – 2016-09-08 (×5): via INTRAVENOUS
  Filled 2016-09-05 (×6): qty 1000

## 2016-09-05 MED ORDER — INFLUENZA VAC SPLIT QUAD 0.5 ML IM SUSY
0.5000 mL | PREFILLED_SYRINGE | INTRAMUSCULAR | Status: AC
  Administered 2016-09-06: 0.5 mL via INTRAMUSCULAR
  Filled 2016-09-05: qty 0.5

## 2016-09-05 MED ORDER — CHLORHEXIDINE GLUCONATE 0.12 % MT SOLN
15.0000 mL | Freq: Two times a day (BID) | OROMUCOSAL | Status: DC
  Administered 2016-09-05 – 2016-09-06 (×4): 15 mL via OROMUCOSAL
  Filled 2016-09-05 (×4): qty 15

## 2016-09-05 NOTE — Progress Notes (Signed)
PROGRESS NOTE    Jessica Pace  [id number] DOB: [birth year] DOA: 09/04/2016 PCP: Harland Dingwall, NP    Brief Narrative:  55 year old female history of diverticulitis, diabetes, hypertension recently finished a ten-day course of oral ciprofloxacin and Flagyl 08/15/2016 for acute diverticulitis presented back with abdominal pain. CT scan concerning for an acute diverticulitis. General surgery following. Patient empirically on IV Zosyn. Bowel rest. Pain management.   Assessment & Plan:   Principal Problem:   Diverticulitis Active Problems:   Obesity (BMI 30-39.9)   Diabetes type 2, uncontrolled ([place])   Essential hypertension   History of hypothyroidism  #1 acute recurrent diverticulitis Patient admitted with diverticulitis after being treated with several rounds of antibiotics since July for both diverticulitis a UTI. Patient just finished a ten-day course of oral Cipro and Flagyl on 08/15/2016 and presented back to ED with abdominal pain. CT abdomen and pelvis concerning for an acute diverticulitis. Patient admitted. Clinical improvement on pain medications. Continue IV Zosyn. General surgery following. Patient worsens may need further evaluation by general surgery including expiratory laparotomy. If patient improves clinically and is transitioning to oral intake and discharged home will likely need to be seen by gastroenterology for colonoscopy in about 6 weeks. General surgery following and appreciate input and recommendations.  #2 hypertension Stable. Continue Cozaar.  #3 diabetes mellitus type 2 Hemoglobin A1c was 6.4 02/29/2016. Currently nothing by mouth. Sliding scale insulin.  #4 obesity   DVT prophylaxis: Lovenox Code Status: Full Family Communication: Updated patient and family at bedside. Disposition Plan: Home once medically stable tolerating oral intake with improvement with abdominal pain and per general surgery.   Consultants:   Gen. surgery: Dr. Ninfa Linden  09/04/2016  Procedures:   CT abdomen and pelvis 09/04/2016  Antimicrobials:   IV Zosyn 09/04/2016   Subjective: Patient states abdominal pain improved on pain medication. No shortness of breath. No chest pain.  Objective: Vitals:   09/04/16 1915 09/04/16 1959 09/05/16 0543 09/05/16 1421  BP: 130/71 128/67 116/61 128/62  Pulse: 73 67 71 67  Resp:  18 18 18   Temp:  98.9 F (37.2 C) 98.4 F (36.9 C) 97.4 F (36.3 C)  TempSrc:   Oral Oral  SpO2: 100% 100% 99% 99%  Weight:      Height:        Intake/Output Summary (Last 24 hours) at 09/05/16 1829 Last data filed at 09/05/16 1530  Gross per 24 hour  Intake              515 ml  Output             1150 ml  Net             -635 ml   Filed Weights   09/04/16 1517  Weight: 97.1 kg (214 lb)    Examination:  General exam: Appears calm and comfortable  Respiratory system:.Clear to auscultation. Respiratory effort normal. Cardiovascular system: S1 & S2 heard, RRR. No JVD, murmurs, rubs, gallops or clicks. No pedal edema. Gastrointestinal system: Abdomen is nondistended, soft and minimal to mild tenderness to palpation in the left lower quadrant. Positive bowel sounds. No organomegaly or masses felt.  Central nervous system: Alert and oriented. No focal neurological deficits. Extremities: Symmetric 5 x 5 power. Skin: No rashes, lesions or ulcers Psychiatry: Judgement and insight appear normal. Mood & affect appropriate.     Data Reviewed: I have personally reviewed following labs and imaging studies  CBC:  Recent Labs Lab 09/04/16 1519 09/05/16 0424  WBC  8.2 5.8  HGB 11.8* 10.5*  HCT 35.2* 32.0*  MCV 84.6 85.6  PLT 182 [id number]   Basic Metabolic Panel:  Recent Labs Lab 09/04/16 1519 09/05/16 0424  NA 136 135  K 3.9 5.0  CL 104 107  CO2 23 18*  GLUCOSE 128* 106*  BUN 8 7  CREATININE 0.79 0.87  CALCIUM 9.1 8.3*   GFR: Estimated Creatinine Clearance: 80.8 mL/min (by C-G formula based on SCr of 0.87  mg/dL). Liver Function Tests:  Recent Labs Lab 09/04/16 1519  AST 30  ALT 54  ALKPHOS 67  BILITOT 1.2  PROT 7.6  ALBUMIN 3.9    Recent Labs Lab 09/04/16 1519  LIPASE 26   No results for input(s): AMMONIA in the last 168 hours. Coagulation Profile: No results for input(s): INR, PROTIME in the last 168 hours. Cardiac Enzymes: No results for input(s): CKTOTAL, CKMB, CKMBINDEX, TROPONINI in the last 168 hours. BNP (last 3 results) No results for input(s): PROBNP in the last 8760 hours. HbA1C: No results for input(s): HGBA1C in the last 72 hours. CBG:  Recent Labs Lab 09/04/16 2006 09/05/16 0816 09/05/16 1300 09/05/16 1718  GLUCAP 116* 124* 93 83   Lipid Profile: No results for input(s): CHOL, HDL, LDLCALC, TRIG, CHOLHDL, LDLDIRECT in the last 72 hours. Thyroid Function Tests: No results for input(s): TSH, T4TOTAL, FREET4, T3FREE, THYROIDAB in the last 72 hours. Anemia Panel: No results for input(s): VITAMINB12, FOLATE, FERRITIN, TIBC, IRON, RETICCTPCT in the last 72 hours. Sepsis Labs: No results for input(s): PROCALCITON, LATICACIDVEN in the last 168 hours.  Recent Results (from the past 240 hour(s))  Urine culture     Status: Abnormal   Collection Time: 09/04/16  3:29 PM  Result Value Ref Range Status   Specimen Description URINE, CLEAN CATCH  Final   Special Requests NONE  Final   Culture MULTIPLE SPECIES PRESENT, SUGGEST RECOLLECTION (A)  Final   Report Status 09/05/2016 FINAL  Final         Radiology Studies: Ct Abdomen Pelvis W Contrast  Result Date: 09/04/2016 CLINICAL DATA:  Three-day history of abdominal pain, primarily left-sided EXAM: CT ABDOMEN AND PELVIS WITH CONTRAST TECHNIQUE: Multidetector CT imaging of the abdomen and pelvis was performed using the standard protocol following bolus administration of intravenous contrast. CONTRAST:  167mL ISOVUE-300 IOPAMIDOL (ISOVUE-300) INJECTION 61% COMPARISON:  None. FINDINGS: Lower chest: There is mild  atelectatic change in the posterior right lung base. Lung bases are otherwise clear. Hepatobiliary: Liver measures 19.3 cm in length. No focal liver lesions are evident. Gallbladder wall is not appreciably thickened. There is no biliary duct dilatation. Pancreas: There is no pancreatic mass or inflammatory focus. Spleen: No splenic lesions are evident. There is a small splenule medial to the anterior spleen. Adrenals/Urinary Tract: Adrenals appear normal bilaterally. Kidneys bilaterally show no mass or hydronephrosis on either side. There is no renal or ureteral calculus on either side. Urinary bladder is midline with wall thickness within normal limits. Stomach/Bowel: There is wall thickening in the midportion of the sigmoid colon with surrounding mesenteric thickening consistent with diverticulitis. There is slightly irregular fluid within the lumen of the colon in this area without frank abscess. The earliest changes of phlegmon formation in this area must be of concern, however. No perforation is evident. Elsewhere, there are diverticula in the descending colon without diverticulitis in this area. No other bowel wall or mesenteric thickening is evident. There is no bowel obstruction. No free air or portal venous air evident. Vascular/Lymphatic: There  is atherosclerotic calcification in the aorta and common iliac arteries without aneurysm. The major mesenteric vessels appear patent. By size criteria, there is no adenopathy in the abdomen or pelvis. There are multiple subcentimeter retroperitoneal lymph nodes, regarded as nonspecific. These lymph nodes may well be reactive in etiology due to the diverticulitis. Reproductive: Uterus is absent. No pelvic mass present. A small amount of mildly loculated fluid is noted just anterior to the rectum on the left. Other: The appendix appears normal. There is no abscess or free fluid evident abdomen or pelvis. There is a rather minimal ventral hernia containing only fat.  Musculoskeletal: There is degenerative change in the lumbar spine. There is upper lumbar levoscoliosis. There are no blastic or lytic bone lesions. There is no intramuscular or abdominal wall lesion. IMPRESSION: Midportion sigmoid diverticulitis with wall and mesenteric thickening in this area. Ill-defined fluid in this region potentially could represent earliest phlegmon development. No frank abscess. No perforation evident. Elsewhere, there are multiple diverticula throughout the descending colon without diverticulitis in this region. Small amount of mildly loculated fluid in the cul-de-sac. This fluid may arise secondary to the nearby diverticulitis but also be due to recent ovarian cyst rupture. By size criteria, there is no adenopathy. Multiple small retroperitoneal lymph nodes are present which may well have reactive etiology due to diverticulitis. Liver prominent without focal lesion. Appendix appears normal. No bowel obstruction. No abscess. No renal or ureteral calculus. No hydronephrosis. Electronically Signed   By: Lowella Grip III M.D.   On: 09/04/2016 17:29        Scheduled Meds: . chlorhexidine  15 mL Mouth Rinse BID  . enoxaparin (LOVENOX) injection  40 mg Subcutaneous Q24H  . [START ON 09/06/2016] Influenza vac split quadrivalent PF  0.5 mL Intramuscular Tomorrow-1000  . insulin aspart  0-15 Units Subcutaneous TID WC  . insulin aspart  0-5 Units Subcutaneous QHS  . losartan  25 mg Oral Daily  . mouth rinse  15 mL Mouth Rinse q12n4p  . piperacillin-tazobactam (ZOSYN)  IV  3.375 g Intravenous Q8H   Continuous Infusions: . sodium chloride 0.9 % 1,000 mL infusion 100 mL/hr at 09/05/16 0853     LOS: 1 day    Time spent: 27 minutes    THOMPSON,DANIEL, MD [facility] Pager [phone]  If 7PM-7AM, please contact night-coverage [email or web address] Password [facility] 09/05/2016, 6:29 PM

## 2016-09-05 NOTE — Progress Notes (Signed)
Subjective: She feels better, but attributes it to the pain medicine.  She reports pain original episode was left side upper and lower quadrants, yesterday she had pain left and right sides.  Right side was in RLQ, left was both upper and lower quadrants.    Objective: Vital signs in last 24 hours: Temp:  [98.4 F (36.9 C)-99.4 F (37.4 C)] 98.4 F (36.9 C) (11/20 0543) Pulse Rate:  [67-84] 71 (11/20 0543) Resp:  [14-18] 18 (11/20 0543) BP: (116-138)/(61-86) 116/61 (11/20 0543) SpO2:  [98 %-100 %] 99 % (11/20 0543) Weight:  [97.1 kg (214 lb)] 97.1 kg (214 lb) (11/19 1517) Last BM Date: 09/04/16 500 IV 750 urine Afebrile, VSS Labs OK, (K+5.0) Urine cloudy - urine culture pending Intake/Output from previous day: 11/19 0701 - 11/20 0700 In: 515 [I.V.:515] Out: 750 [Urine:750] Intake/Output this shift: No intake/output data recorded.  General appearance: alert, cooperative, no distress and recent pain med GI: soft, non-tender; bowel sounds normal; no masses,  no organomegaly  Lab Results:   Recent Labs  09/04/16 1519 09/05/16 0424  WBC 8.2 5.8  HGB 11.8* 10.5*  HCT 35.2* 32.0*  PLT 182 150    BMET  Recent Labs  09/04/16 1519 09/05/16 0424  NA 136 135  K 3.9 5.0  CL 104 107  CO2 23 18*  GLUCOSE 128* 106*  BUN 8 7  CREATININE 0.79 0.87  CALCIUM 9.1 8.3*   PT/INR No results for input(s): LABPROT, INR in the last 72 hours.   Recent Labs Lab 09/04/16 1519  AST 30  ALT 54  ALKPHOS 67  BILITOT 1.2  PROT 7.6  ALBUMIN 3.9     Lipase     Component Value Date/Time   LIPASE 26 09/04/2016 1519     Studies/Results: Ct Abdomen Pelvis W Contrast  Result Date: 09/04/2016 CLINICAL DATA:  Three-day history of abdominal pain, primarily left-sided EXAM: CT ABDOMEN AND PELVIS WITH CONTRAST TECHNIQUE: Multidetector CT imaging of the abdomen and pelvis was performed using the standard protocol following bolus administration of intravenous contrast. CONTRAST:   140mL ISOVUE-300 IOPAMIDOL (ISOVUE-300) INJECTION 61% COMPARISON:  None. FINDINGS: Lower chest: There is mild atelectatic change in the posterior right lung base. Lung bases are otherwise clear. Hepatobiliary: Liver measures 19.3 cm in length. No focal liver lesions are evident. Gallbladder wall is not appreciably thickened. There is no biliary duct dilatation. Pancreas: There is no pancreatic mass or inflammatory focus. Spleen: No splenic lesions are evident. There is a small splenule medial to the anterior spleen. Adrenals/Urinary Tract: Adrenals appear normal bilaterally. Kidneys bilaterally show no mass or hydronephrosis on either side. There is no renal or ureteral calculus on either side. Urinary bladder is midline with wall thickness within normal limits. Stomach/Bowel: There is wall thickening in the midportion of the sigmoid colon with surrounding mesenteric thickening consistent with diverticulitis. There is slightly irregular fluid within the lumen of the colon in this area without frank abscess. The earliest changes of phlegmon formation in this area must be of concern, however. No perforation is evident. Elsewhere, there are diverticula in the descending colon without diverticulitis in this area. No other bowel wall or mesenteric thickening is evident. There is no bowel obstruction. No free air or portal venous air evident. Vascular/Lymphatic: There is atherosclerotic calcification in the aorta and common iliac arteries without aneurysm. The major mesenteric vessels appear patent. By size criteria, there is no adenopathy in the abdomen or pelvis. There are multiple subcentimeter retroperitoneal lymph nodes, regarded as  nonspecific. These lymph nodes may well be reactive in etiology due to the diverticulitis. Reproductive: Uterus is absent. No pelvic mass present. A small amount of mildly loculated fluid is noted just anterior to the rectum on the left. Other: The appendix appears normal. There is no  abscess or free fluid evident abdomen or pelvis. There is a rather minimal ventral hernia containing only fat. Musculoskeletal: There is degenerative change in the lumbar spine. There is upper lumbar levoscoliosis. There are no blastic or lytic bone lesions. There is no intramuscular or abdominal wall lesion. IMPRESSION: Midportion sigmoid diverticulitis with wall and mesenteric thickening in this area. Ill-defined fluid in this region potentially could represent earliest phlegmon development. No frank abscess. No perforation evident. Elsewhere, there are multiple diverticula throughout the descending colon without diverticulitis in this region. Small amount of mildly loculated fluid in the cul-de-sac. This fluid may arise secondary to the nearby diverticulitis but also be due to recent ovarian cyst rupture. By size criteria, there is no adenopathy. Multiple small retroperitoneal lymph nodes are present which may well have reactive etiology due to diverticulitis. Liver prominent without focal lesion. Appendix appears normal. No bowel obstruction. No abscess. No renal or ureteral calculus. No hydronephrosis. Electronically Signed   By: Lowella Grip III M.D.   On: 09/04/2016 17:29   Prior to Admission medications   Medication Sig Start Date End Date Taking? Authorizing Provider  Ascorbic Acid (VITAMIN C) 1000 MG tablet Take 1,000 mg by mouth daily.   Yes Historical Provider, MD  ciprofloxacin (CIPRO) 500 MG tablet Take 500 mg by mouth 2 (two) times daily. 11/19, for 7 days ending 11/26 08/05/16  Yes Historical Provider, MD  Cod Liver Oil CAPS Take 1 capsule by mouth every morning.   Yes Historical Provider, MD  Dulaglutide (TRULICITY) [id number] SOPN Inject 1 application into the skin once a week. Patient taking differently: Inject 1 application into the skin every Thursday.  03/01/16  Yes David S Tysinger, PA-C  GARLIC OIL PO Take 1 tablet by mouth daily.   Yes Historical Provider, MD  losartan  (COZAAR) 25 MG tablet Take 1 tablet (25 mg total) by mouth daily. 02/29/16  Yes Camelia Eng Tysinger, PA-C  metFORMIN (GLUCOPHAGE) 1000 MG tablet Take 1 tablet (1,000 mg total) by mouth daily with breakfast. 03/01/16  Yes Camelia Eng Tysinger, PA-C  metroNIDAZOLE (FLAGYL) 500 MG tablet Take 500 mg by mouth 4 (four) times daily. Started 11/19, for 7 days ending 11/26 08/05/16  Yes Historical Provider, MD  polyethylene glycol (MIRALAX / GLYCOLAX) packet Take 17 g by mouth daily as needed for moderate constipation.   Yes Historical Provider, MD  cetirizine (ZYRTEC) 10 MG tablet Take 1 tablet (10 mg total) by mouth daily. Patient not taking: Reported on 09/04/2016 02/29/16   Camelia Eng Tysinger, PA-C  ondansetron (ZOFRAN) 4 MG tablet Take 1 tablet (4 mg total) by mouth every 6 (six) hours. Patient not taking: Reported on 09/04/2016 08/10/16   Bea Graff Law, PA-C  terbinafine (LAMISIL AT) 1 % cream Apply 1 application topically 2 (two) times daily. In between breasts Patient not taking: Reported on 09/04/2016 02/29/16   Camelia Eng Tysinger, PA-C    Medications: . chlorhexidine  15 mL Mouth Rinse BID  . enoxaparin (LOVENOX) injection  40 mg Subcutaneous Q24H  . [START ON 09/06/2016] Influenza vac split quadrivalent PF  0.5 mL Intramuscular Tomorrow-1000  . insulin aspart  0-15 Units Subcutaneous TID WC  . insulin aspart  0-5 Units Subcutaneous  QHS  . losartan  25 mg Oral Daily  . mouth rinse  15 mL Mouth Rinse q12n4p  . piperacillin-tazobactam (ZOSYN)  IV  3.375 g Intravenous Q8H   . 0.9 % NaCl with KCl 20 mEq / L 75 mL/hr at 09/04/16 2008    Assessment/Plan Sigmoid diverticulitis - second episode AODM Hypertension FEN:  IV fluids/NPO ID: Zosyn started 11/19 >> day 2 DVT:  Lovenox    Plan:  Continue IV antibiotics, ice chips, few sips.   LOS: 1 day    Delanie Tirrell 09/05/2016 [phone]

## 2016-09-06 DIAGNOSIS — Z8639 Personal history of other endocrine, nutritional and metabolic disease: Secondary | ICD-10-CM

## 2016-09-06 LAB — BASIC METABOLIC PANEL
ANION GAP: 9 (ref 5–15)
BUN: 6 mg/dL (ref 6–20)
CALCIUM: 9 mg/dL (ref 8.9–10.3)
CO2: 22 mmol/L (ref 22–32)
CREATININE: 0.72 mg/dL (ref 0.44–1.00)
Chloride: 107 mmol/L (ref 101–111)
GFR calc non Af Amer: >60 mL/min (ref >60)
Glucose, Bld: 99 mg/dL (ref 65–99)
Potassium: 3.9 mmol/L (ref 3.5–5.1)
SODIUM: 138 mmol/L (ref 135–145)

## 2016-09-06 LAB — GLUCOSE, CAPILLARY
GLUCOSE-CAPILLARY: 96 mg/dL (ref 65–99)
GLUCOSE-CAPILLARY: 103 mg/dL — AB (ref 65–99)
GLUCOSE-CAPILLARY: 127 mg/dL — AB (ref 65–99)

## 2016-09-06 LAB — CBC WITH DIFFERENTIAL/PLATELET
BASOS ABS: 0 10*3/uL (ref 0.0–0.1)
BASOS PCT: 1 %
EOS ABS: 0.2 10*3/uL (ref 0.0–0.7)
Eosinophils Relative: 4 %
HCT: 33.3 % — ABNORMAL LOW (ref 36.0–46.0)
HEMOGLOBIN: 10.8 g/dL — AB (ref 12.0–15.0)
Lymphocytes Relative: 32 %
Lymphs Abs: 1.5 10*3/uL (ref 0.7–4.0)
MCH: 27.7 pg (ref 26.0–34.0)
MCHC: 32.4 g/dL (ref 30.0–36.0)
MCV: 85.4 fL (ref 78.0–100.0)
MONOS PCT: 5 %
Monocytes Absolute: 0.3 10*3/uL (ref 0.1–1.0)
NEUTROS ABS: 2.7 10*3/uL (ref 1.7–7.7)
NEUTROS PCT: 58 %
Platelets: 174 10*3/uL (ref 150–400)
RBC: 3.9 MIL/uL (ref 3.87–5.11)
RDW: 14.9 % (ref 11.5–15.5)
WBC: 4.6 10*3/uL (ref 4.0–10.5)

## 2016-09-06 NOTE — Progress Notes (Signed)
PROGRESS NOTE    Jessica Pace  [id number] DOB: Jul 23, 1961 DOA: 09/04/2016 PCP: Harland Dingwall, NP    Brief Narrative:  55 year old female history of diverticulitis, diabetes, hypertension recently finished a ten-day course of oral ciprofloxacin and Flagyl 08/15/2016 for acute diverticulitis presented back with abdominal pain. CT scan concerning for an acute diverticulitis. General surgery following. Patient empirically on IV Zosyn. Bowel rest. Pain management.   Assessment & Plan:   Principal Problem:   Diverticulitis Active Problems:   Obesity (BMI 30-39.9)   Diabetes type 2, uncontrolled ([place])   Essential hypertension   History of hypothyroidism  #1 acute recurrent diverticulitis Patient admitted with diverticulitis after being treated with several rounds of antibiotics since July for both diverticulitis a UTI. Patient just finished a ten-day course of oral Cipro and Flagyl on 08/15/2016 and presented back to ED with abdominal pain. CT abdomen and pelvis concerning for an acute diverticulitis. Patient admitted. Clinical improvement on pain medications. Continue IV Zosyn. General surgery following. Patient worsens may need further evaluation by general surgery including exploratory laparotomy. Patient has been started on clear liquids. Continue IV antibiotics.  If patient improves clinically and is transitioning to oral intake and discharged home will likely need to be seen by gastroenterology for colonoscopy in about 6 weeks. General surgery following and appreciate input and recommendations.  #2 hypertension Stable. Continue Cozaar.  #3 diabetes mellitus type 2 Hemoglobin A1c was 6.4 02/29/2016. CBGs have ranged from 83-96. Patient has been started on clears. Sliding scale insulin.  #4 obesity   DVT prophylaxis: Lovenox Code Status: Full Family Communication: Updated patient and family at bedside. Disposition Plan: Home once medically stable tolerating oral intake with  improvement with abdominal pain and per general surgery.   Consultants:   Gen. surgery: Dr. Ninfa Linden 09/04/2016  Procedures:   CT abdomen and pelvis 09/04/2016  Antimicrobials:   IV Zosyn 09/04/2016   Subjective: Patient states abdominal pain improved and had to take pain medication once last night. No shortness of breath. No chest pain. Patient has been started on clears.  Objective: Vitals:   09/05/16 1421 09/05/16 2115 09/06/16 0622 09/06/16 1514  BP: 128/62 114/66 122/73 127/71  Pulse: 67 73 66 73  Resp: 18 18 18 18   Temp: 97.4 F (36.3 C) 98.1 F (36.7 C) 98.5 F (36.9 C) 98.6 F (37 C)  TempSrc: Oral Oral Oral Oral  SpO2: 99% 98% 98% 98%  Weight:      Height:        Intake/Output Summary (Last 24 hours) at 09/06/16 1615 Last data filed at 09/06/16 1514  Gross per 24 hour  Intake          2905.01 ml  Output             2550 ml  Net           355.01 ml   Filed Weights   09/04/16 1517  Weight: 97.1 kg (214 lb)    Examination:  General exam: Appears calm and comfortable  Respiratory system:.Clear to auscultation. Respiratory effort normal. Cardiovascular system: S1 & S2 heard, RRR. No JVD, murmurs, rubs, gallops or clicks. No pedal edema. Gastrointestinal system: Abdomen is nondistended, soft and minimal tenderness to palpation in the left lower quadrant. Positive bowel sounds. No organomegaly or masses felt.  Central nervous system: Alert and oriented. No focal neurological deficits. Extremities: Symmetric 5 x 5 power. Skin: No rashes, lesions or ulcers Psychiatry: Judgement and insight appear normal. Mood & affect appropriate.  Data Reviewed: I have personally reviewed following labs and imaging studies  CBC:  Recent Labs Lab 09/04/16 1519 09/05/16 0424 09/06/16 0916  WBC 8.2 5.8 4.6  NEUTROABS  --   --  2.7  HGB 11.8* 10.5* 10.8*  HCT 35.2* 32.0* 33.3*  MCV 84.6 85.6 85.4  PLT 182 150 [id number]   Basic Metabolic Panel:  Recent Labs Lab  09/04/16 1519 09/05/16 0424 09/06/16 1124  NA 136 135 138  K 3.9 5.0 3.9  CL 104 107 107  CO2 23 18* 22  GLUCOSE 128* 106* 99  BUN 8 7 6   CREATININE 0.79 0.87 0.72  CALCIUM 9.1 8.3* 9.0   GFR: Estimated Creatinine Clearance: 87.8 mL/min (by C-G formula based on SCr of 0.72 mg/dL). Liver Function Tests:  Recent Labs Lab 09/04/16 1519  AST 30  ALT 54  ALKPHOS 67  BILITOT 1.2  PROT 7.6  ALBUMIN 3.9    Recent Labs Lab 09/04/16 1519  LIPASE 26   No results for input(s): AMMONIA in the last 168 hours. Coagulation Profile: No results for input(s): INR, PROTIME in the last 168 hours. Cardiac Enzymes: No results for input(s): CKTOTAL, CKMB, CKMBINDEX, TROPONINI in the last 168 hours. BNP (last 3 results) No results for input(s): PROBNP in the last 8760 hours. HbA1C: No results for input(s): HGBA1C in the last 72 hours. CBG:  Recent Labs Lab 09/05/16 0816 09/05/16 1300 09/05/16 1718 09/05/16 2110 09/06/16 0743  GLUCAP 124* 93 83 90 96   Lipid Profile: No results for input(s): CHOL, HDL, LDLCALC, TRIG, CHOLHDL, LDLDIRECT in the last 72 hours. Thyroid Function Tests: No results for input(s): TSH, T4TOTAL, FREET4, T3FREE, THYROIDAB in the last 72 hours. Anemia Panel: No results for input(s): VITAMINB12, FOLATE, FERRITIN, TIBC, IRON, RETICCTPCT in the last 72 hours. Sepsis Labs: No results for input(s): PROCALCITON, LATICACIDVEN in the last 168 hours.  Recent Results (from the past 240 hour(s))  Urine culture     Status: Abnormal   Collection Time: 09/04/16  3:29 PM  Result Value Ref Range Status   Specimen Description URINE, CLEAN CATCH  Final   Special Requests NONE  Final   Culture MULTIPLE SPECIES PRESENT, SUGGEST RECOLLECTION (A)  Final   Report Status 09/05/2016 FINAL  Final         Radiology Studies: Ct Abdomen Pelvis W Contrast  Result Date: 09/04/2016 CLINICAL DATA:  Three-day history of abdominal pain, primarily left-sided EXAM: CT ABDOMEN  AND PELVIS WITH CONTRAST TECHNIQUE: Multidetector CT imaging of the abdomen and pelvis was performed using the standard protocol following bolus administration of intravenous contrast. CONTRAST:  189mL ISOVUE-300 IOPAMIDOL (ISOVUE-300) INJECTION 61% COMPARISON:  None. FINDINGS: Lower chest: There is mild atelectatic change in the posterior right lung base. Lung bases are otherwise clear. Hepatobiliary: Liver measures 19.3 cm in length. No focal liver lesions are evident. Gallbladder wall is not appreciably thickened. There is no biliary duct dilatation. Pancreas: There is no pancreatic mass or inflammatory focus. Spleen: No splenic lesions are evident. There is a small splenule medial to the anterior spleen. Adrenals/Urinary Tract: Adrenals appear normal bilaterally. Kidneys bilaterally show no mass or hydronephrosis on either side. There is no renal or ureteral calculus on either side. Urinary bladder is midline with wall thickness within normal limits. Stomach/Bowel: There is wall thickening in the midportion of the sigmoid colon with surrounding mesenteric thickening consistent with diverticulitis. There is slightly irregular fluid within the lumen of the colon in this area without frank abscess. The earliest changes  of phlegmon formation in this area must be of concern, however. No perforation is evident. Elsewhere, there are diverticula in the descending colon without diverticulitis in this area. No other bowel wall or mesenteric thickening is evident. There is no bowel obstruction. No free air or portal venous air evident. Vascular/Lymphatic: There is atherosclerotic calcification in the aorta and common iliac arteries without aneurysm. The major mesenteric vessels appear patent. By size criteria, there is no adenopathy in the abdomen or pelvis. There are multiple subcentimeter retroperitoneal lymph nodes, regarded as nonspecific. These lymph nodes may well be reactive in etiology due to the diverticulitis.  Reproductive: Uterus is absent. No pelvic mass present. A small amount of mildly loculated fluid is noted just anterior to the rectum on the left. Other: The appendix appears normal. There is no abscess or free fluid evident abdomen or pelvis. There is a rather minimal ventral hernia containing only fat. Musculoskeletal: There is degenerative change in the lumbar spine. There is upper lumbar levoscoliosis. There are no blastic or lytic bone lesions. There is no intramuscular or abdominal wall lesion. IMPRESSION: Midportion sigmoid diverticulitis with wall and mesenteric thickening in this area. Ill-defined fluid in this region potentially could represent earliest phlegmon development. No frank abscess. No perforation evident. Elsewhere, there are multiple diverticula throughout the descending colon without diverticulitis in this region. Small amount of mildly loculated fluid in the cul-de-sac. This fluid may arise secondary to the nearby diverticulitis but also be due to recent ovarian cyst rupture. By size criteria, there is no adenopathy. Multiple small retroperitoneal lymph nodes are present which may well have reactive etiology due to diverticulitis. Liver prominent without focal lesion. Appendix appears normal. No bowel obstruction. No abscess. No renal or ureteral calculus. No hydronephrosis. Electronically Signed   By: Lowella Grip III M.D.   On: 09/04/2016 17:29        Scheduled Meds: . chlorhexidine  15 mL Mouth Rinse BID  . enoxaparin (LOVENOX) injection  40 mg Subcutaneous Q24H  . insulin aspart  0-15 Units Subcutaneous TID WC  . insulin aspart  0-5 Units Subcutaneous QHS  . losartan  25 mg Oral Daily  . mouth rinse  15 mL Mouth Rinse q12n4p  . piperacillin-tazobactam (ZOSYN)  IV  3.375 g Intravenous Q8H   Continuous Infusions: . sodium chloride 0.9 % 1,000 mL infusion 100 mL/hr at 09/06/16 1611     LOS: 2 days    Time spent: 97 minutes    Lincon Sahlin, MD [facility] Pager [phone]  If 7PM-7AM, please contact night-coverage [email or web address] Password [email or web address] 09/06/2016, 4:15 PM

## 2016-09-06 NOTE — Progress Notes (Signed)
Subjective: She feels much better, didn't take anything for pain last PM still a little sore on left side, but markedly improved.    Objective: Vital signs in last 24 hours: Temp:  [97.4 F (36.3 C)-98.5 F (36.9 C)] 98.5 F (36.9 C) (11/21 0622) Pulse Rate:  [66-73] 66 (11/21 0622) Resp:  [18] 18 (11/21 0622) BP: (114-128)/(62-73) 122/73 (11/21 0622) SpO2:  [98 %-99 %] 98 % (11/21 0622) Last BM Date: 09/04/16 NPO 2300 IV Urine 1650 Afebrile, VSS No labs   Intake/Output from previous day: 11/20 0701 - 11/21 0700 In: 2305 [I.V.:2155; IV Piggyback:150] Out: [id number] [Urine:1650] Intake/Output this shift: No intake/output data recorded.  General appearance: alert, cooperative and no distress GI: soft, pt up in chair, minimally tender left side on palpation.    Lab Results:   Recent Labs  09/04/16 1519 09/05/16 0424  WBC 8.2 5.8  HGB 11.8* 10.5*  HCT 35.2* 32.0*  PLT 182 150    BMET  Recent Labs  09/04/16 1519 09/05/16 0424  NA 136 135  K 3.9 5.0  CL 104 107  CO2 23 18*  GLUCOSE 128* 106*  BUN 8 7  CREATININE 0.79 0.87  CALCIUM 9.1 8.3*   PT/INR No results for input(s): LABPROT, INR in the last 72 hours.   Recent Labs Lab 09/04/16 1519  AST 30  ALT 54  ALKPHOS 67  BILITOT 1.2  PROT 7.6  ALBUMIN 3.9     Lipase     Component Value Date/Time   LIPASE 26 09/04/2016 1519     Studies/Results: Ct Abdomen Pelvis W Contrast  Result Date: 09/04/2016 CLINICAL DATA:  Three-day history of abdominal pain, primarily left-sided EXAM: CT ABDOMEN AND PELVIS WITH CONTRAST TECHNIQUE: Multidetector CT imaging of the abdomen and pelvis was performed using the standard protocol following bolus administration of intravenous contrast. CONTRAST:  14mL ISOVUE-300 IOPAMIDOL (ISOVUE-300) INJECTION 61% COMPARISON:  None. FINDINGS: Lower chest: There is mild atelectatic change in the posterior right lung base. Lung bases are otherwise clear. Hepatobiliary: Liver  measures 19.3 cm in length. No focal liver lesions are evident. Gallbladder wall is not appreciably thickened. There is no biliary duct dilatation. Pancreas: There is no pancreatic mass or inflammatory focus. Spleen: No splenic lesions are evident. There is a small splenule medial to the anterior spleen. Adrenals/Urinary Tract: Adrenals appear normal bilaterally. Kidneys bilaterally show no mass or hydronephrosis on either side. There is no renal or ureteral calculus on either side. Urinary bladder is midline with wall thickness within normal limits. Stomach/Bowel: There is wall thickening in the midportion of the sigmoid colon with surrounding mesenteric thickening consistent with diverticulitis. There is slightly irregular fluid within the lumen of the colon in this area without frank abscess. The earliest changes of phlegmon formation in this area must be of concern, however. No perforation is evident. Elsewhere, there are diverticula in the descending colon without diverticulitis in this area. No other bowel wall or mesenteric thickening is evident. There is no bowel obstruction. No free air or portal venous air evident. Vascular/Lymphatic: There is atherosclerotic calcification in the aorta and common iliac arteries without aneurysm. The major mesenteric vessels appear patent. By size criteria, there is no adenopathy in the abdomen or pelvis. There are multiple subcentimeter retroperitoneal lymph nodes, regarded as nonspecific. These lymph nodes may well be reactive in etiology due to the diverticulitis. Reproductive: Uterus is absent. No pelvic mass present. A small amount of mildly loculated fluid is noted just anterior to the rectum on  the left. Other: The appendix appears normal. There is no abscess or free fluid evident abdomen or pelvis. There is a rather minimal ventral hernia containing only fat. Musculoskeletal: There is degenerative change in the lumbar spine. There is upper lumbar levoscoliosis.  There are no blastic or lytic bone lesions. There is no intramuscular or abdominal wall lesion. IMPRESSION: Midportion sigmoid diverticulitis with wall and mesenteric thickening in this area. Ill-defined fluid in this region potentially could represent earliest phlegmon development. No frank abscess. No perforation evident. Elsewhere, there are multiple diverticula throughout the descending colon without diverticulitis in this region. Small amount of mildly loculated fluid in the cul-de-sac. This fluid may arise secondary to the nearby diverticulitis but also be due to recent ovarian cyst rupture. By size criteria, there is no adenopathy. Multiple small retroperitoneal lymph nodes are present which may well have reactive etiology due to diverticulitis. Liver prominent without focal lesion. Appendix appears normal. No bowel obstruction. No abscess. No renal or ureteral calculus. No hydronephrosis. Electronically Signed   By: Lowella Grip III M.D.   On: 09/04/2016 17:29    Medications: . chlorhexidine  15 mL Mouth Rinse BID  . enoxaparin (LOVENOX) injection  40 mg Subcutaneous Q24H  . Influenza vac split quadrivalent PF  0.5 mL Intramuscular Tomorrow-1000  . insulin aspart  0-15 Units Subcutaneous TID WC  . insulin aspart  0-5 Units Subcutaneous QHS  . losartan  25 mg Oral Daily  . mouth rinse  15 mL Mouth Rinse q12n4p  . piperacillin-tazobactam (ZOSYN)  IV  3.375 g Intravenous Q8H    Assessment/Plan Sigmoid diverticulitis - second episode AODM Hypertension FEN:  IV fluids/NPO x ice chips/sips with meds ID: Zosyn started 11/19 >> day 3 DVT:  Lovenox   Plan:  Continue antibiotics and try some clears with her.    LOS: 2 days    Jessica Pace 09/06/2016 [phone]

## 2016-09-07 LAB — BASIC METABOLIC PANEL
ANION GAP: 8 (ref 5–15)
BUN: <5 mg/dL — ABNORMAL LOW (ref 6–20)
CO2: 25 mmol/L (ref 22–32)
Calcium: 8.8 mg/dL — ABNORMAL LOW (ref 8.9–10.3)
Chloride: 105 mmol/L (ref 101–111)
Creatinine, Ser: 0.76 mg/dL (ref 0.44–1.00)
Glucose, Bld: 131 mg/dL — ABNORMAL HIGH (ref 65–99)
POTASSIUM: 3.6 mmol/L (ref 3.5–5.1)
SODIUM: 138 mmol/L (ref 135–145)

## 2016-09-07 LAB — GLUCOSE, CAPILLARY
GLUCOSE-CAPILLARY: 127 mg/dL — AB (ref 65–99)
GLUCOSE-CAPILLARY: 115 mg/dL — AB (ref 65–99)
GLUCOSE-CAPILLARY: 113 mg/dL — AB (ref 65–99)
Glucose-Capillary: 126 mg/dL — ABNORMAL HIGH (ref 65–99)
Glucose-Capillary: 95 mg/dL (ref 65–99)

## 2016-09-07 LAB — CBC
HCT: 33 % — ABNORMAL LOW (ref 36.0–46.0)
HEMOGLOBIN: 10.9 g/dL — AB (ref 12.0–15.0)
MCH: 27.8 pg (ref 26.0–34.0)
MCHC: 33 g/dL (ref 30.0–36.0)
MCV: 84.2 fL (ref 78.0–100.0)
PLATELETS: 197 10*3/uL (ref 150–400)
RBC: 3.92 MIL/uL (ref 3.87–5.11)
RDW: 14.7 % (ref 11.5–15.5)
WBC: 4.2 10*3/uL (ref 4.0–10.5)

## 2016-09-07 NOTE — Progress Notes (Signed)
PROGRESS NOTE    Jessica Pace  [id number] DOB: [birth year] DOA: 09/04/2016 PCP: Harland Dingwall, NP    Brief Narrative:  55 year old female history of diverticulitis, diabetes, hypertension recently finished a ten-day course of oral ciprofloxacin and Flagyl 08/15/2016 for acute diverticulitis presented back with abdominal pain. CT scan concerning for an acute diverticulitis. General surgery following. Patient empirically on IV Zosyn. Bowel rest. Pain management.   Assessment & Plan:   Principal Problem:   Diverticulitis Active Problems:   Obesity (BMI 30-39.9)   Diabetes type 2, uncontrolled ([place])   Essential hypertension   History of hypothyroidism  #1 acute recurrent diverticulitis Patient admitted with diverticulitis after being treated with several rounds of antibiotics since July for both diverticulitis a UTI. Patient just finished a ten-day course of oral Cipro and Flagyl on 08/15/2016 and presented back to ED with abdominal pain. CT abdomen and pelvis concerning for an acute diverticulitis. Patient admitted. Clinical improvement on pain medications. Continue IV Zosyn. General surgery following. Patient worsens may need further evaluation by general surgery including exploratory laparotomy.  If patient improves clinically and is transitioning to oral intake and discharged home will likely need to be seen by gastroenterology for colonoscopy in about 6 weeks. General surgery following and appreciate input and recommendations. Patient continues to improve. Surgery follow-up appreciated. Advancing diet to full liquid diet. Continue antibiotics. Possible DC in next 1-2 days.  #2 hypertension Stable. Continue Cozaar.  #3 diabetes mellitus type 2 Hemoglobin A1c was 6.4 02/29/2016. CBGs reasonably controlled. Sliding scale insulin.  #4 obesity  Anemia -Stable   DVT prophylaxis: Lovenox Code Status: Full Family Communication: Updated and daughter at bedside Disposition Plan:  Home once medically stable tolerating oral intake with improvement with abdominal pain and per general surgery. Possible home in the next 1-2 days.   Consultants:   Gen. surgery: Dr. Ninfa Linden 09/04/2016  Procedures:   CT abdomen and pelvis 09/04/2016  Antimicrobials:   IV Zosyn 09/04/2016   Subjective: Continues to improve. Mild intermittent lower abdominal pain. Tolerating diet.  Objective: Vitals:   09/07/16 0243 09/07/16 0538 09/07/16 1238 09/07/16 1715  BP:  129/64  126/79  Pulse:  67  66  Resp:  18  18  Temp: 99.5 F (37.5 C) 99 F (37.2 C) 98.7 F (37.1 C) 99.1 F (37.3 C)  TempSrc: Oral Oral Oral Oral  SpO2:  97%  97%  Weight:      Height:        Intake/Output Summary (Last 24 hours) at 09/07/16 1819 Last data filed at 09/07/16 1400  Gross per 24 hour  Intake          3781.25 ml  Output             3250 ml  Net           531.25 ml   Filed Weights   09/04/16 1517  Weight: 97.1 kg (214 lb)    Examination:  General exam: Appears calm and comfortable. Sitting up comfortably in chair. Respiratory system:.Clear to auscultation. Respiratory effort normal. Cardiovascular system: S1 & S2 heard, RRR. No JVD, murmurs, rubs, gallops or clicks. No pedal edema. Gastrointestinal system: Abdomen is nondistended, soft and minimal tenderness to palpation in the left lower quadrant. Positive bowel sounds. No organomegaly or masses felt.  Central nervous system: Alert and oriented. No focal neurological deficits. Extremities: Symmetric 5 x 5 power. Skin: No rashes, lesions or ulcers Psychiatry: Judgement and insight appear normal. Mood & affect appropriate.  Data Reviewed: I have personally reviewed following labs and imaging studies  CBC:  Recent Labs Lab 09/04/16 1519 09/05/16 0424 09/06/16 0916 09/07/16 0412  WBC 8.2 5.8 4.6 4.2  NEUTROABS  --   --  2.7  --   HGB 11.8* 10.5* 10.8* 10.9*  HCT 35.2* 32.0* 33.3* 33.0*  MCV 84.6 85.6 85.4 84.2  PLT 182  150 174 [id number]   Basic Metabolic Panel:  Recent Labs Lab 09/04/16 1519 09/05/16 0424 09/06/16 1124 09/07/16 0412  NA 136 135 138 138  K 3.9 5.0 3.9 3.6  CL 104 107 107 105  CO2 23 18* 22 25  GLUCOSE 128* 106* 99 131*  BUN 8 7 6  <5*  CREATININE 0.79 0.87 0.72 0.76  CALCIUM 9.1 8.3* 9.0 8.8*   GFR: Estimated Creatinine Clearance: 87.8 mL/min (by C-G formula based on SCr of 0.76 mg/dL). Liver Function Tests:  Recent Labs Lab 09/04/16 1519  AST 30  ALT 54  ALKPHOS 67  BILITOT 1.2  PROT 7.6  ALBUMIN 3.9    Recent Labs Lab 09/04/16 1519  LIPASE 26   No results for input(s): AMMONIA in the last 168 hours. Coagulation Profile: No results for input(s): INR, PROTIME in the last 168 hours. Cardiac Enzymes: No results for input(s): CKTOTAL, CKMB, CKMBINDEX, TROPONINI in the last 168 hours. BNP (last 3 results) No results for input(s): PROBNP in the last 8760 hours. HbA1C: No results for input(s): HGBA1C in the last 72 hours. CBG:  Recent Labs Lab 09/06/16 2147 09/07/16 0251 09/07/16 0758 09/07/16 1226 09/07/16 1711  GLUCAP 127* 127* 126* 95 115*   Lipid Profile: No results for input(s): CHOL, HDL, LDLCALC, TRIG, CHOLHDL, LDLDIRECT in the last 72 hours. Thyroid Function Tests: No results for input(s): TSH, T4TOTAL, FREET4, T3FREE, THYROIDAB in the last 72 hours. Anemia Panel: No results for input(s): VITAMINB12, FOLATE, FERRITIN, TIBC, IRON, RETICCTPCT in the last 72 hours. Sepsis Labs: No results for input(s): PROCALCITON, LATICACIDVEN in the last 168 hours.  Recent Results (from the past 240 hour(s))  Urine culture     Status: Abnormal   Collection Time: 09/04/16  3:29 PM  Result Value Ref Range Status   Specimen Description URINE, CLEAN CATCH  Final   Special Requests NONE  Final   Culture MULTIPLE SPECIES PRESENT, SUGGEST RECOLLECTION (A)  Final   Report Status 09/05/2016 FINAL  Final         Radiology Studies: No results  found.      Scheduled Meds: . enoxaparin (LOVENOX) injection  40 mg Subcutaneous Q24H  . insulin aspart  0-15 Units Subcutaneous TID WC  . insulin aspart  0-5 Units Subcutaneous QHS  . losartan  25 mg Oral Daily  . piperacillin-tazobactam (ZOSYN)  IV  3.375 g Intravenous Q8H   Continuous Infusions: . sodium chloride 0.9 % 1,000 mL infusion 75 mL/hr at 09/06/16 1639     LOS: 3 days    Darely Becknell, MD, FACP, FHM. [facility] Pager [phone]  If 7PM-7AM, please contact night-coverage [email or web address] Password [facility] 09/07/2016, 6:23 PM

## 2016-09-07 NOTE — Progress Notes (Signed)
  Subjective: Pt doing well.  No abd pain with PO  Having bowel mvmts  Objective: Vital signs in last 24 hours: Temp:  [98.5 F (36.9 C)-99.5 F (37.5 C)] 99 F (37.2 C) (11/22 0538) Pulse Rate:  [62-73] 67 (11/22 0538) Resp:  [18] 18 (11/22 0538) BP: (116-129)/(64-71) 129/64 (11/22 0538) SpO2:  [97 %-99 %] 97 % (11/22 0538) Last BM Date: 09/06/16  Intake/Output from previous day: 11/21 0701 - 11/22 0700 In: 3561.3 [P.O.:1380; I.V.:2031.3; IV Piggyback:150] Out: 3650 [Urine:3650] Intake/Output this shift: No intake/output data recorded.  General appearance: alert and cooperative GI: soft, non-tender; bowel sounds normal; no masses,  no organomegaly  Lab Results:   Recent Labs  09/06/16 0916 09/07/16 0412  WBC 4.6 4.2  HGB 10.8* 10.9*  HCT 33.3* 33.0*  PLT 174 197   BMET  Recent Labs  09/06/16 1124 09/07/16 0412  NA 138 138  K 3.9 3.6  CL 107 105  CO2 22 25  GLUCOSE 99 131*  BUN 6 <5*  CREATININE 0.72 0.76  CALCIUM 9.0 8.8*   PT/INR No results for input(s): LABPROT, INR in the last 72 hours. ABG No results for input(s): PHART, HCO3 in the last 72 hours.  Invalid input(s): PCO2, PO2  Studies/Results: No results found.  Anti-infectives: Anti-infectives    Start     Dose/Rate Route Frequency Ordered Stop   09/05/16 0600  piperacillin-tazobactam (ZOSYN) IVPB 3.375 g     3.375 g 12.5 mL/hr over 240 Minutes Intravenous Every 8 hours 09/04/16 1911     09/04/16 1930  piperacillin-tazobactam (ZOSYN) IVPB 3.375 g     3.375 g 100 mL/hr over 30 Minutes Intravenous  Once 09/04/16 1911 09/04/16 2004   09/04/16 1815  ciprofloxacin (CIPRO) IVPB 400 mg  Status:  Discontinued     400 mg 200 mL/hr over 60 Minutes Intravenous  Once 09/04/16 1805 09/04/16 1844   09/04/16 1815  metroNIDAZOLE (FLAGYL) IVPB 500 mg  Status:  Discontinued     500 mg 100 mL/hr over 60 Minutes Intravenous  Once 09/04/16 1805 09/04/16 1844      Assessment/Plan: Principal  Problem:   Diverticulitis Active Problems:   Obesity (BMI 30-39.9)   Diabetes type 2, uncontrolled ([place])   Essential hypertension   History of hypothyroidism    1. Adv diet to FLD 2. con't abx 3. Will need colonoscopy in 6 weeks 4. Home in next 1-2d if con't to do well on abx     LOS: 3 days    Rosario Jacks., [facility] 09/07/2016

## 2016-09-08 LAB — GLUCOSE, CAPILLARY
GLUCOSE-CAPILLARY: 124 mg/dL — AB (ref 65–99)
GLUCOSE-CAPILLARY: 109 mg/dL — AB (ref 65–99)
GLUCOSE-CAPILLARY: 176 mg/dL — AB (ref 65–99)
Glucose-Capillary: 95 mg/dL (ref 65–99)

## 2016-09-08 MED ORDER — AMOXICILLIN-POT CLAVULANATE 875-125 MG PO TABS
1.0000 | ORAL_TABLET | Freq: Two times a day (BID) | ORAL | Status: DC
  Administered 2016-09-08 – 2016-09-09 (×3): 1 via ORAL
  Filled 2016-09-08 (×3): qty 1

## 2016-09-08 NOTE — Progress Notes (Signed)
PROGRESS NOTE    Jessica Pace  [id number] DOB: [birth year] DOA: 09/04/2016 PCP: Harland Dingwall, NP    Brief Narrative:  55 year old female history of diverticulitis, diabetes, hypertension recently finished a ten-day course of oral ciprofloxacin and Flagyl 08/15/2016 for acute diverticulitis presented back with abdominal pain. CT scan concerning for an acute diverticulitis. General surgery following. Patient empirically on IV Zosyn. Bowel rest. Pain management.   Assessment & Plan:   Principal Problem:   Diverticulitis Active Problems:   Obesity (BMI 30-39.9)   Diabetes type 2, uncontrolled ([place])   Essential hypertension   History of hypothyroidism  #1 acute recurrent diverticulitis Patient admitted with diverticulitis after being treated with several rounds of antibiotics since July for both diverticulitis a UTI. Patient just finished a ten-day course of oral Cipro and Flagyl on 08/15/2016 and presented back to ED with abdominal pain. CT abdomen and pelvis concerning for an acute diverticulitis. Patient admitted. Clinical improvement on pain medications. Continue IV Zosyn. General surgery following. Patient worsens may need further evaluation by general surgery including exploratory laparotomy.  If patient improves clinically and is transitioning to oral intake and discharged home will likely need to be seen by gastroenterology for colonoscopy in about 6 weeks. General surgery following and appreciate input and recommendations. Patient continues to improve. Surgery follow-up appreciated. Advancing diet to soft diet 11/23. Antibiotics changed to oral. Discussed with general surgery. Likely discharge home 11/24.  #2 hypertension Stable. Continue Cozaar.  #3 diabetes mellitus type 2 Hemoglobin A1c was 6.4 02/29/2016. CBGs reasonably controlled. Sliding scale insulin.  #4 obesity  Anemia -Stable   DVT prophylaxis: Lovenox Code Status: Full Family Communication: None at  bedside today. Disposition Plan: Monitor overnight and possible discharge home 11/24   Consultants:   Gen. surgery: Dr. Ninfa Linden 09/04/2016  Procedures:   CT abdomen and pelvis 09/04/2016  Antimicrobials:   IV Zosyn 09/04/2016-discontinued  Oral Augmentin 11/23 >   Subjective: Tolerated full liquid diet. Denies abdominal pain. Had BM this morning. Denies any other complaints.  Objective: Vitals:   09/07/16 2116 09/07/16 2159 09/08/16 0617 09/08/16 0923  BP: 140/71  133/67 139/70  Pulse: 74  71 66  Resp: 17  17   Temp: 99.9 F (37.7 C) 99.5 F (37.5 C) 98.7 F (37.1 C)   TempSrc: Oral Oral Oral   SpO2: 98%  99%   Weight:      Height:        Intake/Output Summary (Last 24 hours) at 09/08/16 1245 Last data filed at 09/08/16 0924  Gross per 24 hour  Intake             2360 ml  Output             1050 ml  Net             1310 ml   Filed Weights   09/04/16 1517  Weight: 97.1 kg (214 lb)    Examination:  General exam: Appears calm and comfortable. Sitting up comfortably in chair. Respiratory system:.Clear to auscultation. Respiratory effort normal. Cardiovascular system: S1 & S2 heard, RRR. No JVD, murmurs, rubs, gallops or clicks. No pedal edema. Gastrointestinal system: Abdomen is nondistended, soft and non tender. Positive bowel sounds. No organomegaly or masses felt.  Central nervous system: Alert and oriented. No focal neurological deficits. Extremities: Symmetric 5 x 5 power. Skin: No rashes, lesions or ulcers Psychiatry: Judgement and insight appear normal. Mood & affect appropriate.     Data Reviewed: I have personally reviewed following  labs and imaging studies  CBC:  Recent Labs Lab 09/04/16 1519 09/05/16 0424 09/06/16 0916 09/07/16 0412  WBC 8.2 5.8 4.6 4.2  NEUTROABS  --   --  2.7  --   HGB 11.8* 10.5* 10.8* 10.9*  HCT 35.2* 32.0* 33.3* 33.0*  MCV 84.6 85.6 85.4 84.2  PLT 182 150 174 [id number]   Basic Metabolic Panel:  Recent Labs Lab  09/04/16 1519 09/05/16 0424 09/06/16 1124 09/07/16 0412  NA 136 135 138 138  K 3.9 5.0 3.9 3.6  CL 104 107 107 105  CO2 23 18* 22 25  GLUCOSE 128* 106* 99 131*  BUN 8 7 6  <5*  CREATININE 0.79 0.87 0.72 0.76  CALCIUM 9.1 8.3* 9.0 8.8*   GFR: Estimated Creatinine Clearance: 87.8 mL/min (by C-G formula based on SCr of 0.76 mg/dL). Liver Function Tests:  Recent Labs Lab 09/04/16 1519  AST 30  ALT 54  ALKPHOS 67  BILITOT 1.2  PROT 7.6  ALBUMIN 3.9    Recent Labs Lab 09/04/16 1519  LIPASE 26   No results for input(s): AMMONIA in the last 168 hours. Coagulation Profile: No results for input(s): INR, PROTIME in the last 168 hours. Cardiac Enzymes: No results for input(s): CKTOTAL, CKMB, CKMBINDEX, TROPONINI in the last 168 hours. BNP (last 3 results) No results for input(s): PROBNP in the last 8760 hours. HbA1C: No results for input(s): HGBA1C in the last 72 hours. CBG:  Recent Labs Lab 09/07/16 1226 09/07/16 1711 09/07/16 2100 09/08/16 0806 09/08/16 1146  GLUCAP 95 115* 113* 124* 109*   Lipid Profile: No results for input(s): CHOL, HDL, LDLCALC, TRIG, CHOLHDL, LDLDIRECT in the last 72 hours. Thyroid Function Tests: No results for input(s): TSH, T4TOTAL, FREET4, T3FREE, THYROIDAB in the last 72 hours. Anemia Panel: No results for input(s): VITAMINB12, FOLATE, FERRITIN, TIBC, IRON, RETICCTPCT in the last 72 hours. Sepsis Labs: No results for input(s): PROCALCITON, LATICACIDVEN in the last 168 hours.  Recent Results (from the past 240 hour(s))  Urine culture     Status: Abnormal   Collection Time: 09/04/16  3:29 PM  Result Value Ref Range Status   Specimen Description URINE, CLEAN CATCH  Final   Special Requests NONE  Final   Culture MULTIPLE SPECIES PRESENT, SUGGEST RECOLLECTION (A)  Final   Report Status 09/05/2016 FINAL  Final         Radiology Studies: No results found.      Scheduled Meds: . amoxicillin-clavulanate  1 tablet Oral Q12H    . enoxaparin (LOVENOX) injection  40 mg Subcutaneous Q24H  . insulin aspart  0-15 Units Subcutaneous TID WC  . insulin aspart  0-5 Units Subcutaneous QHS  . losartan  25 mg Oral Daily   Continuous Infusions: . sodium chloride 0.9 % 1,000 mL infusion 75 mL/hr at 09/08/16 0928     LOS: 4 days    Dejanira Pamintuan, MD, FACP, FHM. [facility] Pager [phone]  If 7PM-7AM, please contact night-coverage [email or web address] Password [email or web address] 09/08/2016, 12:45 PM

## 2016-09-08 NOTE — Progress Notes (Signed)
  Subjective: No abdominal pain Tolerating full liquids Just had a bowel movement this morning  Objective: Vital signs in last 24 hours: Temp:  [98.7 F (37.1 C)-99.9 F (37.7 C)] 98.7 F (37.1 C) (11/23 0617) Pulse Rate:  [66-74] 66 (11/23 0923) Resp:  [17-18] 17 (11/23 0617) BP: (126-140)/(67-79) 139/70 (11/23 0923) SpO2:  [97 %-99 %] 99 % (11/23 0617) Last BM Date: 09/07/16  Intake/Output from previous day: 11/22 0701 - 11/23 0700 In: 2100 [P.O.:1500; I.V.:600] Out: 1250 [Urine:1250] Intake/Output this shift: Total I/O In: 480 [P.O.:480] Out: -   Gen - WDWN in NAD Lungs - CTA B CV - RRR Abd - soft, non-tender; + BS; no masses Lab Results:   Recent Labs  09/06/16 0916 09/07/16 0412  WBC 4.6 4.2  HGB 10.8* 10.9*  HCT 33.3* 33.0*  PLT 174 197   BMET  Recent Labs  09/06/16 1124 09/07/16 0412  NA 138 138  K 3.9 3.6  CL 107 105  CO2 22 25  GLUCOSE 99 131*  BUN 6 <5*  CREATININE 0.72 0.76  CALCIUM 9.0 8.8*   PT/INR No results for input(s): LABPROT, INR in the last 72 hours. ABG No results for input(s): PHART, HCO3 in the last 72 hours.  Invalid input(s): PCO2, PO2  Studies/Results: No results found.  Anti-infectives: Anti-infectives    Start     Dose/Rate Route Frequency Ordered Stop   09/08/16 1030  amoxicillin-clavulanate (AUGMENTIN) 875-125 MG per tablet 1 tablet     1 tablet Oral Every 12 hours 09/08/16 1023     09/05/16 0600  piperacillin-tazobactam (ZOSYN) IVPB 3.375 g  Status:  Discontinued     3.375 g 12.5 mL/hr over 240 Minutes Intravenous Every 8 hours 09/04/16 1911 09/08/16 1023   09/04/16 1930  piperacillin-tazobactam (ZOSYN) IVPB 3.375 g     3.375 g 100 mL/hr over 30 Minutes Intravenous  Once 09/04/16 1911 09/04/16 2004   09/04/16 1815  ciprofloxacin (CIPRO) IVPB 400 mg  Status:  Discontinued     400 mg 200 mL/hr over 60 Minutes Intravenous  Once 09/04/16 1805 09/04/16 1844   09/04/16 1815  metroNIDAZOLE (FLAGYL) IVPB 500 mg   Status:  Discontinued     500 mg 100 mL/hr over 60 Minutes Intravenous  Once 09/04/16 1805 09/04/16 1844      Assessment/Plan: Sigmoid diverticulitis - improving  Advance to soft diet PO antibiotics Possible discharge tomorrow.   LOS: 4 days    Feven Alderfer K. 09/08/2016

## 2016-09-09 LAB — GLUCOSE, CAPILLARY
Glucose-Capillary: 132 mg/dL — ABNORMAL HIGH (ref 65–99)
Glucose-Capillary: 113 mg/dL — ABNORMAL HIGH (ref 65–99)

## 2016-09-09 MED ORDER — FLUCONAZOLE 100 MG PO TABS
150.0000 mg | ORAL_TABLET | Freq: Every day | ORAL | Status: DC
  Administered 2016-09-09: 150 mg via ORAL
  Filled 2016-09-09: qty 2

## 2016-09-09 MED ORDER — ACETAMINOPHEN 325 MG PO TABS: 650.0000 mg | ORAL_TABLET | Freq: Four times a day (QID) | ORAL | Status: DC | PRN

## 2016-09-09 MED ORDER — AMOXICILLIN-POT CLAVULANATE 875-125 MG PO TABS: 1.0000 | ORAL_TABLET | Freq: Two times a day (BID) | ORAL | 0 refills | Status: DC

## 2016-09-09 NOTE — Progress Notes (Signed)
Pt discharged to home.  Discharge instructions explained to pt.  Pt has no questions at the time of discharge.  Pt states she has all belongings.  IV removed.  Pt taken off unit via wheelchair by staff. 

## 2016-09-09 NOTE — Discharge Summary (Signed)
Physician Discharge Summary  Jessica Pace [id number] DOB: 1961/01/11  PCP: Harland Dingwall, NP  Admit date: 09/04/2016 Discharge date: 09/09/2016   Recommendations for Outpatient Follow-up:  1. Harland Dingwall, NP/PCP in one week with repeat labs (CBC & BMP). PCP to assist patient with outpatient GI consultation for colonoscopy in 6-8 weeks after her acute diverticulitis has completely resolved. 2. Dr. Coralie Keens, General Surgery: After colonoscopy to discuss long-term treatment.  Home Health: None Equipment/Devices: None    Discharge Condition: Improved and stable  CODE STATUS: Full  Diet recommendation: Soft and low-residue diet. Instructions provided. After several days, may gradually advance diet.  Discharge Diagnoses:  Principal Problem:   Diverticulitis Active Problems:   Obesity (BMI 30-39.9)   Diabetes type 2, uncontrolled ([place])   Essential hypertension   History of hypothyroidism   Brief/Interim Summary: 55 year old female history of diverticulitis, diabetes, hypertension recently finished a ten-day course of oral ciprofloxacin and Flagyl 08/15/2016 for acute diverticulitis presented back with abdominal pain. CT scan concerning for an acute diverticulitis.   Assessment & Plan:   #1 acute recurrent diverticulitis Patient admitted with diverticulitis after being treated with several rounds of antibiotics since July for both diverticulitis and UTI. Patient just finished a ten-day course of oral Cipro and Flagyl on 08/15/2016 and presented back to ED with abdominal pain. CT abdomen and pelvis concerning for an acute diverticulitis. Patient admitted. She was treated conservatively with bowel rest, IV antibiotics/Zosyn, IV fluids and pain management. General surgery was consulted. With this management, she made gradual improvement. Her diet was gradually advanced. She was transitioned to oral Augmentin yesterday. She is tolerating soft diet without nausea or vomiting.  Having BMs. Minimal intermittent left-sided abdominal pain which is relieved after flatus. Has not used much of her pain medications except Tylenol. General surgery has seen her today and cleared her for discharge home. She has been advised to follow-up with her PCP who can assist with outpatient GI consultation to undergo colonoscopy in 6-8 weeks when her acute diverticulitis is completely resolved followed by follow-up with general surgery for long-term management. She has been educated regarding low fiber diet for several days before advancing diet as needed. I have discussed with Dr. Georgette Dover.   #2 hypertension Stable. Continue Cozaar.  #3 diabetes mellitus type 2 Hemoglobin A1c was 6.4 on 02/29/2016. She was treated in the hospital with SSI with reasonable control. Resume home medications at discharge.  #4 obesity Will eventually need diet, exercise and weight loss.  #5 Anemia -Stable. Follow CBC as outpatient and evaluate as needed.  #6. Vaginal candidiasis - She completed treatment with a dose of oral Diflucan prescribed by general surgery.    Consultants:   Gen. surgery  Procedures:   None  Discharge Instructions  Discharge Instructions    Call MD for:  difficulty breathing, headache or visual disturbances    Complete by:  As directed    Call MD for:  extreme fatigue    Complete by:  As directed    Call MD for:  persistant dizziness or light-headedness    Complete by:  As directed    Call MD for:  persistant nausea and vomiting    Complete by:  As directed    Call MD for:  severe uncontrolled pain    Complete by:  As directed    Call MD for:  temperature >100.4    Complete by:  As directed    Diet - low sodium heart healthy    Complete by:  As directed    Diet Carb Modified    Complete by:  As directed    Discharge instructions    Complete by:  As directed    Diet: Soft and low fiber diet for several days and then gradually advance diet as tolerated.    Increase activity slowly    Complete by:  As directed        Medication List    STOP taking these medications   cetirizine 10 MG tablet Commonly known as:  ZYRTEC   ciprofloxacin 500 MG tablet Commonly known as:  CIPRO   metroNIDAZOLE 500 MG tablet Commonly known as:  FLAGYL   ondansetron 4 MG tablet Commonly known as:  ZOFRAN   terbinafine 1 % cream Commonly known as:  LAMISIL AT     TAKE these medications   acetaminophen 325 MG tablet Commonly known as:  TYLENOL Take 2 tablets (650 mg total) by mouth every 6 (six) hours as needed for mild pain, moderate pain or fever (or Fever >/= 101).   amoxicillin-clavulanate 875-125 MG tablet Commonly known as:  AUGMENTIN Take 1 tablet by mouth 2 (two) times daily.   Cod Liver Oil Caps Take 1 capsule by mouth every morning.   Dulaglutide 0.75 MG/0.5ML Sopn Commonly known as:  TRULICITY Inject 1 application into the skin once a week. What changed:  when to take this   GARLIC OIL PO Take 1 tablet by mouth daily.   losartan 25 MG tablet Commonly known as:  COZAAR Take 1 tablet (25 mg total) by mouth daily.   metFORMIN 1000 MG tablet Commonly known as:  GLUCOPHAGE Take 1 tablet (1,000 mg total) by mouth daily with breakfast.   polyethylene glycol packet Commonly known as:  MIRALAX / GLYCOLAX Take 17 g by mouth daily as needed for moderate constipation.   vitamin C 1000 MG tablet Take 1,000 mg by mouth daily.      Follow-up Information    BLACKMAN,DOUGLAS A, MD. Call.   Specialty:  General Surgery Why:  after colonoscopy to discuss long term treatment Contact information: [address] [place] [address] [phone]        Vickie Henson, NP. Schedule an appointment as soon as possible for a visit in 1 week(s).   Specialty:  Family Medicine Why:  To be seen with repeat labs (CBC & BMP). Recommend outpatient GI consultation for colonoscopy in 6-8 weeks. Family physician to assist patient with GI  consultation. Contact information: [address] [place] [address] [phone]          Allergies  Allergen Reactions  . Butyl Acetate Other (See Comments)    In paints and nail polish- unknown      Procedures/Studies: Ct Abdomen Pelvis W Contrast  Result Date: 09/04/2016 CLINICAL DATA:  Three-day history of abdominal pain, primarily left-sided EXAM: CT ABDOMEN AND PELVIS WITH CONTRAST TECHNIQUE: Multidetector CT imaging of the abdomen and pelvis was performed using the standard protocol following bolus administration of intravenous contrast. CONTRAST:  126mL ISOVUE-300 IOPAMIDOL (ISOVUE-300) INJECTION 61% COMPARISON:  None. FINDINGS: Lower chest: There is mild atelectatic change in the posterior right lung base. Lung bases are otherwise clear. Hepatobiliary: Liver measures 19.3 cm in length. No focal liver lesions are evident. Gallbladder wall is not appreciably thickened. There is no biliary duct dilatation. Pancreas: There is no pancreatic mass or inflammatory focus. Spleen: No splenic lesions are evident. There is a small splenule medial to the anterior spleen. Adrenals/Urinary Tract: Adrenals appear  normal bilaterally. Kidneys bilaterally show no mass or hydronephrosis on either side. There is no renal or ureteral calculus on either side. Urinary bladder is midline with wall thickness within normal limits. Stomach/Bowel: There is wall thickening in the midportion of the sigmoid colon with surrounding mesenteric thickening consistent with diverticulitis. There is slightly irregular fluid within the lumen of the colon in this area without frank abscess. The earliest changes of phlegmon formation in this area must be of concern, however. No perforation is evident. Elsewhere, there are diverticula in the descending colon without diverticulitis in this area. No other bowel wall or mesenteric thickening is evident. There is no bowel obstruction. No free air or portal venous air  evident. Vascular/Lymphatic: There is atherosclerotic calcification in the aorta and common iliac arteries without aneurysm. The major mesenteric vessels appear patent. By size criteria, there is no adenopathy in the abdomen or pelvis. There are multiple subcentimeter retroperitoneal lymph nodes, regarded as nonspecific. These lymph nodes may well be reactive in etiology due to the diverticulitis. Reproductive: Uterus is absent. No pelvic mass present. A small amount of mildly loculated fluid is noted just anterior to the rectum on the left. Other: The appendix appears normal. There is no abscess or free fluid evident abdomen or pelvis. There is a rather minimal ventral hernia containing only fat. Musculoskeletal: There is degenerative change in the lumbar spine. There is upper lumbar levoscoliosis. There are no blastic or lytic bone lesions. There is no intramuscular or abdominal wall lesion. IMPRESSION: Midportion sigmoid diverticulitis with wall and mesenteric thickening in this area. Ill-defined fluid in this region potentially could represent earliest phlegmon development. No frank abscess. No perforation evident. Elsewhere, there are multiple diverticula throughout the descending colon without diverticulitis in this region. Small amount of mildly loculated fluid in the cul-de-sac. This fluid may arise secondary to the nearby diverticulitis but also be due to recent ovarian cyst rupture. By size criteria, there is no adenopathy. Multiple small retroperitoneal lymph nodes are present which may well have reactive etiology due to diverticulitis. Liver prominent without focal lesion. Appendix appears normal. No bowel obstruction. No abscess. No renal or ureteral calculus. No hydronephrosis. Electronically Signed   By: Lowella Grip III M.D.   On: 09/04/2016 17:29      Subjective: Tolerating soft diet. Having BMs. Ambulating comfortably. No nausea or vomiting. Intermittent minimal left-sided abdominal pain  which is relieved by passing flatus. Denies any other complaints. Anxious to go home.  Discharge Exam:  Vitals:   09/08/16 1326 09/08/16 2143 09/09/16 0556 09/09/16 0846  BP: 137/75 133/70 (!) 143/74 134/67  Pulse: 74 66 61 67  Resp: 16 16 20    Temp: 98.4 F (36.9 C) 98.2 F (36.8 C) 98.5 F (36.9 C)   TempSrc: Oral Oral Oral   SpO2: 100% 100% 99%   Weight:      Height:        General: Pt lying comfortably in bed & appears in no obvious distress. Cardiovascular: S1 & S2 heard, RRR, S1/S2 +. No murmurs, rubs, gallops or clicks. No JVD or pedal edema. Respiratory: Clear to auscultation without wheezing, rhonchi or crackles. No increased work of breathing. Abdominal:  Non distended, non tender & soft. No organomegaly or masses appreciated. Normal bowel sounds heard. CNS: Alert and oriented. No focal deficits. Extremities: no edema, no cyanosis    The results of significant diagnostics from this hospitalization (including imaging, microbiology, ancillary and laboratory) are listed below for reference.     Microbiology:  Recent Results (from the past 240 hour(s))  Urine culture     Status: Abnormal   Collection Time: 09/04/16  3:29 PM  Result Value Ref Range Status   Specimen Description URINE, CLEAN CATCH  Final   Special Requests NONE  Final   Culture MULTIPLE SPECIES PRESENT, SUGGEST RECOLLECTION (A)  Final   Report Status 09/05/2016 FINAL  Final     Labs: BNP (last 3 results) No results for input(s): BNP in the last 8760 hours. Basic Metabolic Panel:  Recent Labs Lab 09/04/16 1519 09/05/16 0424 09/06/16 1124 09/07/16 0412  NA 136 135 138 138  K 3.9 5.0 3.9 3.6  CL 104 107 107 105  CO2 23 18* 22 25  GLUCOSE 128* 106* 99 131*  BUN 8 7 6  <5*  CREATININE 0.79 0.87 0.72 0.76  CALCIUM 9.1 8.3* 9.0 8.8*   Liver Function Tests:  Recent Labs Lab 09/04/16 1519  AST 30  ALT 54  ALKPHOS 67  BILITOT 1.2  PROT 7.6  ALBUMIN 3.9    Recent Labs Lab  09/04/16 1519  LIPASE 26   No results for input(s): AMMONIA in the last 168 hours. CBC:  Recent Labs Lab 09/04/16 1519 09/05/16 0424 09/06/16 0916 09/07/16 0412  WBC 8.2 5.8 4.6 4.2  NEUTROABS  --   --  2.7  --   HGB 11.8* 10.5* 10.8* 10.9*  HCT 35.2* 32.0* 33.3* 33.0*  MCV 84.6 85.6 85.4 84.2  PLT 182 150 174 197   Cardiac Enzymes: No results for input(s): CKTOTAL, CKMB, CKMBINDEX, TROPONINI in the last 168 hours. BNP: Invalid input(s): POCBNP CBG:  Recent Labs Lab 09/08/16 0806 09/08/16 1146 09/08/16 1709 09/08/16 2150 09/09/16 0816  GLUCAP 124* 109* 95 176* 132*   Urinalysis    Component Value Date/Time   COLORURINE AMBER (A) 09/04/2016 1543   APPEARANCEUR CLOUDY (A) 09/04/2016 1543   LABSPEC 1.020 09/04/2016 1543   PHURINE 5.0 09/04/2016 1543   GLUCOSEU NEGATIVE 09/04/2016 1543   HGBUR NEGATIVE 09/04/2016 1543   BILIRUBINUR NEGATIVE 09/04/2016 1543   BILIRUBINUR n 09/01/2015 0920   KETONESUR NEGATIVE 09/04/2016 1543   PROTEINUR NEGATIVE 09/04/2016 1543   UROBILINOGEN negative 09/01/2015 0920   UROBILINOGEN 0.2 07/26/2015 2052   NITRITE NEGATIVE 09/04/2016 1543   LEUKOCYTESUR MODERATE (A) 09/04/2016 1543     Time coordinating discharge: Over 30 minutes  SIGNED:  Vernell Leep, MD, FACP, FHM. [facility] Pager [phone]  If 7PM-7AM, please contact night-coverage [email or web address] Password [email or web address] 09/09/2016, 11:15 AM

## 2016-09-09 NOTE — Progress Notes (Addendum)
Patient ID: Jessica Pace, female   DOB: 1961-04-06, 55 y.o.   MRN: [id number]  [facility] Surgery Progress Note     Subjective: Tolerating soft diet. Minimal LLQ abdominal pain. BM yesterday. Denies n/v. Ready to go home.  Objective: Vital signs in last 24 hours: Temp:  [98.2 F (36.8 C)-98.5 F (36.9 C)] 98.5 F (36.9 C) (11/24 0556) Pulse Rate:  [61-74] 61 (11/24 0556) Resp:  [16-20] 20 (11/24 0556) BP: (133-143)/(70-75) 143/74 (11/24 0556) SpO2:  [99 %-100 %] 99 % (11/24 0556) Last BM Date: 09/08/16  Intake/Output from previous day: 11/23 0701 - 11/24 0700 In: 2188.3 [P.O.:960; I.V.:1228.3] Out: -  Intake/Output this shift: No intake/output data recorded.  PE: Gen:  Alert, NAD, pleasant Pulm:  Effort normal Abd: Soft, ND, no HSM, mild LLQ tenderness Ext:  No erythema, edema, or tenderness   Lab Results:   Recent Labs  09/06/16 0916 09/07/16 0412  WBC 4.6 4.2  HGB 10.8* 10.9*  HCT 33.3* 33.0*  PLT 174 197   BMET  Recent Labs  09/06/16 1124 09/07/16 0412  NA 138 138  K 3.9 3.6  CL 107 105  CO2 22 25  GLUCOSE 99 131*  BUN 6 <5*  CREATININE 0.72 0.76  CALCIUM 9.0 8.8*   PT/INR No results for input(s): LABPROT, INR in the last 72 hours. CMP     Component Value Date/Time   NA 138 09/07/2016 0412   K 3.6 09/07/2016 0412   CL 105 09/07/2016 0412   CO2 25 09/07/2016 0412   GLUCOSE 131 (H) 09/07/2016 0412   BUN <5 (L) 09/07/2016 0412   CREATININE 0.76 09/07/2016 0412   CREATININE 0.81 02/29/2016 0819   CALCIUM 8.8 (L) 09/07/2016 0412   PROT 7.6 09/04/2016 1519   ALBUMIN 3.9 09/04/2016 1519   AST 30 09/04/2016 1519   ALT 54 09/04/2016 1519   ALKPHOS 67 09/04/2016 1519   BILITOT 1.2 09/04/2016 1519   GFRNONAA >60 09/07/2016 0412   GFRAA >60 09/07/2016 0412   Lipase     Component Value Date/Time   LIPASE 26 09/04/2016 1519       Studies/Results: No results found.  Anti-infectives: Anti-infectives    Start     Dose/Rate Route  Frequency Ordered Stop   09/08/16 1200  amoxicillin-clavulanate (AUGMENTIN) 875-125 MG per tablet 1 tablet     1 tablet Oral Every 12 hours 09/08/16 1023     09/05/16 0600  piperacillin-tazobactam (ZOSYN) IVPB 3.375 g  Status:  Discontinued     3.375 g 12.5 mL/hr over 240 Minutes Intravenous Every 8 hours 09/04/16 1911 09/08/16 1023   09/04/16 1930  piperacillin-tazobactam (ZOSYN) IVPB 3.375 g     3.375 g 100 mL/hr over 30 Minutes Intravenous  Once 09/04/16 1911 09/04/16 2004   09/04/16 1815  ciprofloxacin (CIPRO) IVPB 400 mg  Status:  Discontinued     400 mg 200 mL/hr over 60 Minutes Intravenous  Once 09/04/16 1805 09/04/16 1844   09/04/16 1815  metroNIDAZOLE (FLAGYL) IVPB 500 mg  Status:  Discontinued     500 mg 100 mL/hr over 60 Minutes Intravenous  Once 09/04/16 1805 09/04/16 1844       Assessment/Plan Sigmoid diverticulitis - second episode AODM Hypertension  FEN: soft diet ID: Zosyn started 11/19>>11/23, augmentin 11/23>> DVT: Lovenox   Plan:  continue soft diet. Diflucan for yeast infection. Add probiotic. Continue PO antibiotics. Ready for discharge from surgical stand point. Recommend follow-up with GI for colonoscopy in 6-8 weeks, and follow-up with  Dr. Ninfa Linden after that.   LOS: 5 days    Jerrye Beavers , [facility] Surgery 09/09/2016, 7:45 AM Pager: [phone] Consults: [phone] Mon-Fri 7:00 am-4:30 pm Sat-Sun 7:00 am-11:30 am  Ready for discharge. Follow-up plans in chart.  Imogene Burn. Georgette Dover, MD, [facility] Surgery  General/ Trauma Surgery  09/09/2016 10:18 AM

## 2016-09-09 NOTE — Discharge Instructions (Addendum)
Diverticulitis Diverticulitis is inflammation or infection of small pouches in your colon that form when you have a condition called diverticulosis. The pouches in your colon are called diverticula. Your colon, or large intestine, is where water is absorbed and stool is formed. Complications of diverticulitis can include:  Bleeding.  Severe infection.  Severe pain.  Perforation of your colon.  Obstruction of your colon. What are the causes? Diverticulitis is caused by bacteria. Diverticulitis happens when stool becomes trapped in diverticula. This allows bacteria to grow in the diverticula, which can lead to inflammation and infection. What increases the risk? People with diverticulosis are at risk for diverticulitis. Eating a diet that does not include enough fiber from fruits and vegetables may make diverticulitis more likely to develop. What are the signs or symptoms? Symptoms of diverticulitis may include:  Abdominal pain and tenderness. The pain is normally located on the left side of the abdomen, but may occur in other areas.  Fever and chills.  Bloating.  Cramping.  Nausea.  Vomiting.  Constipation.  Diarrhea.  Blood in your stool. How is this diagnosed? Your health care provider will ask you about your medical history and do a physical exam. You may need to have tests done because many medical conditions can cause the same symptoms as diverticulitis. Tests may include:  Blood tests.  Urine tests.  Imaging tests of the abdomen, including X-rays and CT scans. When your condition is under control, your health care provider may recommend that you have a colonoscopy. A colonoscopy can show how severe your diverticula are and whether something else is causing your symptoms. How is this treated? Most cases of diverticulitis are mild and can be treated at home. Treatment may include:  Taking over-the-counter pain medicines.  Following a clear liquid diet.  Taking  antibiotic medicines by mouth for 7-10 days. More severe cases may be treated at a hospital. Treatment may include:  Not eating or drinking.  Taking prescription pain medicine.  Receiving antibiotic medicines through an IV tube.  Receiving fluids and nutrition through an IV tube.  Surgery. Follow these instructions at home:  Follow your health care providers instructions carefully.  Follow a full liquid diet or other diet as directed by your health care provider. After your symptoms improve, your health care provider may tell you to change your diet. He or she may recommend you eat a high-fiber diet. Fruits and vegetables are good sources of fiber. Fiber makes it easier to pass stool.  Take fiber supplements or probiotics as directed by your health care provider.  Only take medicines as directed by your health care provider.  Keep all your follow-up appointments. Contact a health care provider if:  Your pain does not improve.  You have a hard time eating food.  Your bowel movements do not return to normal. Get help right away if:  Your pain becomes worse.  Your symptoms do not get better.  Your symptoms suddenly get worse.  You have a fever.  You have repeated vomiting.  You have bloody or black, tarry stools. This information is not intended to replace advice given to you by your health care provider. Make sure you discuss any questions you have with your health care provider. Document Released: 07/13/2005 Document Revised: 03/10/2016 Document Reviewed: 08/28/2013 Elsevier Interactive Patient Education  2017 Elsevier Inc.  Low-Fiber Diet Fiber is found in fruits, vegetables, and whole grains. A low-fiber diet restricts fibrous foods that are not digested in the small intestine. A  diet containing about 10-15 grams of fiber per day is considered low fiber. Low-fiber diets may be used to:  Promote healing and rest the bowel during intestinal flare-ups.  Prevent  blockage of a partially obstructed or narrowed gastrointestinal tract.  Reduce fecal weight and volume.  Slow the movement of feces. You may be on a low-fiber diet as a transitional diet following surgery, after an injury (trauma), or because of a short (acute) or lifelong (chronic) illness. Your health care provider will determine the length of time you need to stay on this diet. What do I need to know about a low-fiber diet? Always check the fiber content on the packaging's Nutrition Facts label, especially on foods from the grains list. Ask your dietitian if you have questions about specific foods that are related to your condition, especially if the food is not listed below. In general, a low-fiber food will have less than 2 g of fiber. What foods can I eat? Grains  All breads and crackers made with white flour. Sweet rolls, doughnuts, waffles, pancakes, [place] toast, bagels. Pretzels, Melba toast, zwieback. Well-cooked cereals, such as cornmeal, farina, or cream cereals. Dry cereals that do not contain whole grains, fruit, or nuts, such as refined corn, wheat, rice, and oat cereals. Potatoes prepared any way without skins, plain pastas and noodles, refined white rice. Use white flour for baking and making sauces. Use allowed list of grains for casseroles, dumplings, and puddings. Vegetables  Strained tomato and vegetable juices. Fresh lettuce, cucumber, spinach. Well-cooked (no skin or pulp) or canned vegetables, such as asparagus, bean sprouts, beets, carrots, green beans, mushrooms, potatoes, pumpkin, spinach, yellow squash, tomato sauce/puree, turnips, yams, and zucchini. Keep servings limited to  cup. Fruits  All fruit juices except prune juice. Cooked or canned fruits without skin and seeds, such as applesauce, apricots, cherries, fruit cocktail, grapefruit, grapes, mandarin oranges, melons, peaches, pears, pineapple, and plums. Fresh fruits without skin, such as apricots, avocados, bananas,  melons, pineapple, nectarines, and peaches. Keep servings limited to  cup or 1 piece. Meat and Other Protein Sources  Ground or well-cooked tender beef, ham, veal, lamb, pork, or poultry. Eggs, plain cheese. Fish, oysters, shrimp, lobster, and other seafood. Liver, organ meats. Smooth nut butters. Dairy  All milk products and alternative dairy substitutes, such as soy, rice, almond, and coconut, not containing added whole nuts, seeds, or added fruit. Beverages  Decaf coffee, fruit, and vegetable juices or smoothies (small amounts, with no pulp or skins, and with fruits from allowed list), sports drinks, herbal tea. Condiments  Ketchup, mustard, vinegar, cream sauce, cheese sauce, cocoa powder. Spices in moderation, such as allspice, basil, bay leaves, celery powder or leaves, cinnamon, cumin powder, curry powder, ginger, mace, marjoram, onion or garlic powder, oregano, paprika, parsley flakes, ground pepper, rosemary, sage, savory, tarragon, thyme, and turmeric. Sweets and Desserts  Plain cakes and cookies, pie made with allowed fruit, pudding, custard, cream pie. Gelatin, fruit, ice, sherbet, frozen ice pops. Ice cream, ice milk without nuts. Plain hard candy, honey, jelly, molasses, syrup, sugar, chocolate syrup, gumdrops, marshmallows. Limit overall sugar intake. Fats and Oil  Margarine, butter, cream, mayonnaise, salad oils, plain salad dressings made from allowed foods. Choose healthy fats such as olive oil, canola oil, and omega-3 fatty acids (such as found in salmon or tuna) when possible. Other  Bouillon, broth, or cream soups made from allowed foods. Any strained soup. Casseroles or mixed dishes made with allowed foods. The items listed above may not be  a complete list of recommended foods or beverages. Contact your dietitian for more options.  What foods are not recommended? Grains  All whole wheat and whole grain breads and crackers. Multigrains, rye, bran seeds, nuts, or coconut.  Cereals containing whole grains, multigrains, bran, coconut, nuts, raisins. Cooked or dry oatmeal, steel-cut oats. Coarse wheat cereals, granola. Cereals advertised as high fiber. Potato skins. Whole grain pasta, wild or brown rice. Popcorn. Coconut flour. Bran, buckwheat, corn bread, multigrains, rye, wheat germ. Vegetables  Fresh, cooked or canned vegetables, such as artichokes, asparagus, beet greens, broccoli, Brussels sprouts, cabbage, celery, cauliflower, corn, eggplant, kale, legumes or beans, okra, peas, and tomatoes. Avoid large servings of any vegetables, especially raw vegetables. Fruits  Fresh fruits, such as apples with or without skin, berries, cherries, figs, grapes, grapefruit, guavas, kiwis, mangoes, oranges, papayas, pears, persimmons, pineapple, and pomegranate. Prune juice and juices with pulp, stewed or dried prunes. Dried fruits, dates, raisins. Fruit seeds or skins. Avoid large servings of all fresh fruits. Meats and Other Protein Sources  Tough, fibrous meats with gristle. Chunky nut butter. Cheese made with seeds, nuts, or other foods not recommended. Nuts, seeds, legumes (beans, including baked beans), dried peas, beans, lentils. Dairy  Yogurt or cheese that contains nuts, seeds, or added fruit. Beverages  Fruit juices with high pulp, prune juice. Caffeinated coffee and teas. Condiments  Coconut, maple syrup, pickles, olives. Sweets and Desserts  Desserts, cookies, or candies that contain nuts or coconut, chunky peanut butter, dried fruits. Jams, preserves with seeds, marmalade. Large amounts of sugar and sweets. Any other dessert made with fruits from the not recommended list. Other  Soups made from vegetables that are not recommended or that contain other foods not recommended. The items listed above may not be a complete list of foods and beverages to avoid. Contact your dietitian for more information.  This information is not intended to replace advice given to you by  your health care provider. Make sure you discuss any questions you have with your health care provider. Document Released: 03/25/2002 Document Revised: 03/10/2016 Document Reviewed: 08/26/2013 Elsevier Interactive Patient Education  2017 Elsevier Inc.  Low-Fiber Diet Fiber is found in fruits, vegetables, and whole grains. A low-fiber diet restricts fibrous foods that are not digested in the small intestine. A diet containing about 10-15 grams of fiber per day is considered low fiber. Low-fiber diets may be used to:  Promote healing and rest the bowel during intestinal flare-ups.  Prevent blockage of a partially obstructed or narrowed gastrointestinal tract.  Reduce fecal weight and volume.  Slow the movement of feces. You may be on a low-fiber diet as a transitional diet following surgery, after an injury (trauma), or because of a short (acute) or lifelong (chronic) illness. Your health care provider will determine the length of time you need to stay on this diet. What do I need to know about a low-fiber diet? Always check the fiber content on the packaging's Nutrition Facts label, especially on foods from the grains list. Ask your dietitian if you have questions about specific foods that are related to your condition, especially if the food is not listed below. In general, a low-fiber food will have less than 2 g of fiber. What foods can I eat? Grains  All breads and crackers made with white flour. Sweet rolls, doughnuts, waffles, pancakes, [place] toast, bagels. Pretzels, Melba toast, zwieback. Well-cooked cereals, such as cornmeal, farina, or cream cereals. Dry cereals that do not contain whole grains, fruit, or  nuts, such as refined corn, wheat, rice, and oat cereals. Potatoes prepared any way without skins, plain pastas and noodles, refined white rice. Use white flour for baking and making sauces. Use allowed list of grains for casseroles, dumplings, and puddings. Vegetables  Strained  tomato and vegetable juices. Fresh lettuce, cucumber, spinach. Well-cooked (no skin or pulp) or canned vegetables, such as asparagus, bean sprouts, beets, carrots, green beans, mushrooms, potatoes, pumpkin, spinach, yellow squash, tomato sauce/puree, turnips, yams, and zucchini. Keep servings limited to  cup. Fruits  All fruit juices except prune juice. Cooked or canned fruits without skin and seeds, such as applesauce, apricots, cherries, fruit cocktail, grapefruit, grapes, mandarin oranges, melons, peaches, pears, pineapple, and plums. Fresh fruits without skin, such as apricots, avocados, bananas, melons, pineapple, nectarines, and peaches. Keep servings limited to  cup or 1 piece. Meat and Other Protein Sources  Ground or well-cooked tender beef, ham, veal, lamb, pork, or poultry. Eggs, plain cheese. Fish, oysters, shrimp, lobster, and other seafood. Liver, organ meats. Smooth nut butters. Dairy  All milk products and alternative dairy substitutes, such as soy, rice, almond, and coconut, not containing added whole nuts, seeds, or added fruit. Beverages  Decaf coffee, fruit, and vegetable juices or smoothies (small amounts, with no pulp or skins, and with fruits from allowed list), sports drinks, herbal tea. Condiments  Ketchup, mustard, vinegar, cream sauce, cheese sauce, cocoa powder. Spices in moderation, such as allspice, basil, bay leaves, celery powder or leaves, cinnamon, cumin powder, curry powder, ginger, mace, marjoram, onion or garlic powder, oregano, paprika, parsley flakes, ground pepper, rosemary, sage, savory, tarragon, thyme, and turmeric. Sweets and Desserts  Plain cakes and cookies, pie made with allowed fruit, pudding, custard, cream pie. Gelatin, fruit, ice, sherbet, frozen ice pops. Ice cream, ice milk without nuts. Plain hard candy, honey, jelly, molasses, syrup, sugar, chocolate syrup, gumdrops, marshmallows. Limit overall sugar intake. Fats and Oil  Margarine, butter,  cream, mayonnaise, salad oils, plain salad dressings made from allowed foods. Choose healthy fats such as olive oil, canola oil, and omega-3 fatty acids (such as found in salmon or tuna) when possible. Other  Bouillon, broth, or cream soups made from allowed foods. Any strained soup. Casseroles or mixed dishes made with allowed foods. The items listed above may not be a complete list of recommended foods or beverages. Contact your dietitian for more options.  What foods are not recommended? Grains  All whole wheat and whole grain breads and crackers. Multigrains, rye, bran seeds, nuts, or coconut. Cereals containing whole grains, multigrains, bran, coconut, nuts, raisins. Cooked or dry oatmeal, steel-cut oats. Coarse wheat cereals, granola. Cereals advertised as high fiber. Potato skins. Whole grain pasta, wild or brown rice. Popcorn. Coconut flour. Bran, buckwheat, corn bread, multigrains, rye, wheat germ. Vegetables  Fresh, cooked or canned vegetables, such as artichokes, asparagus, beet greens, broccoli, Brussels sprouts, cabbage, celery, cauliflower, corn, eggplant, kale, legumes or beans, okra, peas, and tomatoes. Avoid large servings of any vegetables, especially raw vegetables. Fruits  Fresh fruits, such as apples with or without skin, berries, cherries, figs, grapes, grapefruit, guavas, kiwis, mangoes, oranges, papayas, pears, persimmons, pineapple, and pomegranate. Prune juice and juices with pulp, stewed or dried prunes. Dried fruits, dates, raisins. Fruit seeds or skins. Avoid large servings of all fresh fruits. Meats and Other Protein Sources  Tough, fibrous meats with gristle. Chunky nut butter. Cheese made with seeds, nuts, or other foods not recommended. Nuts, seeds, legumes (beans, including baked beans), dried peas, beans, lentils. Dairy  Yogurt or cheese that contains nuts, seeds, or added fruit. Beverages  Fruit juices with high pulp, prune juice. Caffeinated coffee and  teas. Condiments  Coconut, maple syrup, pickles, olives. Sweets and Desserts  Desserts, cookies, or candies that contain nuts or coconut, chunky peanut butter, dried fruits. Jams, preserves with seeds, marmalade. Large amounts of sugar and sweets. Any other dessert made with fruits from the not recommended list. Other  Soups made from vegetables that are not recommended or that contain other foods not recommended. The items listed above may not be a complete list of foods and beverages to avoid. Contact your dietitian for more information.  This information is not intended to replace advice given to you by your health care provider. Make sure you discuss any questions you have with your health care provider. Document Released: 03/25/2002 Document Revised: 03/10/2016 Document Reviewed: 08/26/2013 Elsevier Interactive Patient Education  2017 [employer].

## 2016-09-12 ENCOUNTER — Encounter: Payer: Self-pay | Admitting: Family Medicine

## 2016-09-12 ENCOUNTER — Ambulatory Visit (INDEPENDENT_AMBULATORY_CARE_PROVIDER_SITE_OTHER): Payer: Managed Care, Other (non HMO) | Admitting: Family Medicine

## 2016-09-12 ENCOUNTER — Telehealth: Payer: Self-pay | Admitting: Family Medicine

## 2016-09-12 VITALS — BP 116/60 | HR 76 | Resp 16 | Wt 209.4 lb

## 2016-09-12 DIAGNOSIS — D649 Anemia, unspecified: Secondary | ICD-10-CM

## 2016-09-12 DIAGNOSIS — Z09 Encounter for follow-up examination after completed treatment for conditions other than malignant neoplasm: Secondary | ICD-10-CM | POA: Diagnosis not present

## 2016-09-12 DIAGNOSIS — K5732 Diverticulitis of large intestine without perforation or abscess without bleeding: Secondary | ICD-10-CM | POA: Diagnosis not present

## 2016-09-12 DIAGNOSIS — E119 Type 2 diabetes mellitus without complications: Secondary | ICD-10-CM | POA: Diagnosis not present

## 2016-09-12 LAB — CBC WITH DIFFERENTIAL/PLATELET
Basophils Absolute: 58 cells/uL (ref 0–200)
Basophils Relative: 1 %
EOS PCT: 2 %
Eosinophils Absolute: 116 cells/uL (ref 15–500)
HCT: 36.4 % (ref 35.0–45.0)
HEMOGLOBIN: 12 g/dL (ref 11.7–15.5)
LYMPHS ABS: 2262 {cells}/uL (ref 850–3900)
Lymphocytes Relative: 39 %
MCH: 28.1 pg (ref 27.0–33.0)
MCHC: 33 g/dL (ref 32.0–36.0)
MCV: 85.2 fL (ref 80.0–100.0)
MPV: 10.4 fL (ref 7.5–12.5)
Monocytes Absolute: 522 cells/uL (ref 200–950)
Monocytes Relative: 9 %
NEUTROS ABS: 2842 {cells}/uL (ref 1500–7800)
NEUTROS PCT: 49 %
Platelets: 305 10*3/uL (ref 140–400)
RBC: 4.27 MIL/uL (ref 3.80–5.10)
RDW: 16 % — ABNORMAL HIGH (ref 11.0–15.0)
WBC: 5.8 10*3/uL (ref 4.0–10.5)

## 2016-09-12 LAB — COMPREHENSIVE METABOLIC PANEL
ALK PHOS: 81 U/L (ref 33–130)
ALT: 116 U/L — AB (ref 6–29)
AST: 45 U/L — AB (ref 10–35)
Albumin: 4.5 g/dL (ref 3.6–5.1)
BUN: 11 mg/dL (ref 7–25)
CALCIUM: 9.6 mg/dL (ref 8.6–10.4)
CHLORIDE: 106 mmol/L (ref 98–110)
CO2: 24 mmol/L (ref 20–31)
Creat: 0.76 mg/dL (ref 0.50–1.05)
Glucose, Bld: 87 mg/dL (ref 65–99)
Potassium: 4.3 mmol/L (ref 3.5–5.3)
SODIUM: 139 mmol/L (ref 135–146)
TOTAL PROTEIN: 8.1 g/dL (ref 6.1–8.1)
Total Bilirubin: 0.3 mg/dL (ref 0.2–1.2)

## 2016-09-12 LAB — HEMOGLOBIN A1C

## 2016-09-12 NOTE — Patient Instructions (Signed)
I recommend that you come in for a diabetes check in the next 2 months.

## 2016-09-12 NOTE — Telephone Encounter (Signed)
Left message for pt to call. Needs TOC call documented. Pt already has appt scheduled.

## 2016-09-12 NOTE — Telephone Encounter (Signed)
Pt came in today 09/12/2016. All TOC info will be at appt.

## 2016-09-12 NOTE — Progress Notes (Signed)
   Subjective:    Patient ID: Jessica Pace, female    DOB: 12-04-1960, 55 y.o.   MRN: [id number]  HPI Chief Complaint  Patient presents with  . Hospitalization Follow-up    diverticulitis with abscess. Pt states she was told to f/u here in 1 week.    She is here for hospital follow up. She was admitted to the hospital for diverticulitis on 11/19//2017 and discharged on 09/09/2016. States she is still taking oral antibiotics. Reports eating a soft and low fiber diet. Denies fever, chills, dizziness, chest pain, shortness of breath, abdominal pain, N/V/D or constipation. Reports feeling much better.   States her last colonoscopy was at [facility] and was in her late 53s. States she recalls having some polyps.   She is aware that her hemoglobin was low in the hospital and she does have a history of anemia.   She has a history of diabetes and has not been seen by me since May 2017. Her A1C at that time was 6.4%. States she was seen at [facility] urgent care in October and had a hemoglobin A1C and she does not want another one here today.   No concerns or complaints today.   Past Medical History:  Diagnosis Date  . Complication of anesthesia    "takes me a long time to come out" (09/05/2016)  . Hypertension   . Pneumonia 1990's X 2; 2013  . Type II diabetes mellitus ([place])     Past Surgical History:  Procedure Laterality Date  . ABDOMINAL HYSTERECTOMY  1998  . CARPAL TUNNEL RELEASE Right ~ 2012  . TUBAL LIGATION  1994   Reviewed allergies, medications, past medical, surgical, and social history.      Review of Systems Pertinent positives and negatives in the history of present illness.     Objective:   Physical Exam  Constitutional: She is oriented to person, place, and time. She appears well-developed and well-nourished. No distress.  Pulmonary/Chest: Effort normal.  Neurological: She is alert and oriented to person, place, and time. Coordination normal.  Skin: Skin is warm and  dry. No pallor.   BP 116/60   Pulse 76   Resp 16   Wt 209 lb 6.4 oz (95 kg)   SpO2 93%   BMI 35.94 kg/m       Assessment & Plan:  Hospital discharge follow-up - Plan: CBC with Differential/Platelet, Comprehensive metabolic panel  Diverticulitis of colon - Plan: CBC with Differential/Platelet, Comprehensive metabolic panel, Ambulatory referral to Gastroenterology  Anemia, unspecified type - Plan: CBC with Differential/Platelet, Ambulatory referral to Gastroenterology  Controlled type 2 diabetes mellitus without complication, without long-term current use of insulin ([place])  Discussed that she appears to be doing much better overall. Recommend she continue soft diet with low residue until she follows up with GI. Referral made to GI. She will finish antibiotic prescribed upon discharge from the hospital. She is aware that she has anemia. We will check CBC and CMP today. She refuses a hemoglobin A1c today and states that she had one done at [facility] in October. We do not have a record of this and I am requesting that she have this sent to [place].  Discussed that I recommend that she come back for a diabetes check within the next 1-2 months.

## 2016-09-13 ENCOUNTER — Other Ambulatory Visit: Payer: Self-pay | Admitting: Family Medicine

## 2016-09-13 ENCOUNTER — Encounter: Payer: Self-pay | Admitting: Family Medicine

## 2016-09-13 ENCOUNTER — Encounter: Payer: Self-pay | Admitting: Gastroenterology

## 2016-09-13 DIAGNOSIS — R945 Abnormal results of liver function studies: Secondary | ICD-10-CM

## 2016-09-13 DIAGNOSIS — R7989 Other specified abnormal findings of blood chemistry: Secondary | ICD-10-CM | POA: Insufficient documentation

## 2016-10-12 ENCOUNTER — Telehealth: Payer: Self-pay

## 2016-10-12 ENCOUNTER — Encounter: Payer: Self-pay | Admitting: Family Medicine

## 2016-10-12 NOTE — Telephone Encounter (Signed)
Results from [facility] A1C rcvd. Placed in your folder for review.Jessica Pace

## 2016-10-31 ENCOUNTER — Telehealth: Payer: Self-pay | Admitting: Internal Medicine

## 2016-10-31 NOTE — Telephone Encounter (Signed)
P.A. For Trulicity

## 2016-11-07 ENCOUNTER — Encounter: Payer: Self-pay | Admitting: Gastroenterology

## 2016-11-07 ENCOUNTER — Ambulatory Visit (INDEPENDENT_AMBULATORY_CARE_PROVIDER_SITE_OTHER): Payer: Managed Care, Other (non HMO) | Admitting: Gastroenterology

## 2016-11-07 VITALS — BP 120/84 | HR 72 | Ht 64.0 in | Wt 211.1 lb

## 2016-11-07 DIAGNOSIS — R1032 Left lower quadrant pain: Secondary | ICD-10-CM

## 2016-11-07 DIAGNOSIS — Z8719 Personal history of other diseases of the digestive system: Secondary | ICD-10-CM

## 2016-11-07 MED ORDER — NA SULFATE-K SULFATE-MG SULF 17.5-3.13-1.6 GM/177ML PO SOLN: 1.0000 | Freq: Once | ORAL | 0 refills | Status: AC

## 2016-11-07 NOTE — Progress Notes (Signed)
Jessica Pace    IP:[id number]    [birth year]  Primary Care Physician:Vickie Raenette Rover, NP  Referring Physician: Girtha Rm, NP [place], [place] [address]  Chief complaint:  S/p diverticulitis  HPI:  42 yr F is here for new patient evaluation after a recent episode of diverticulitis in Novemeber 2017.  Other medical history includes diabetes, HTN She started having symptoms in July, noticed change in bowel habits with alternating diarrhea and constipation. In septemeber developed severe llq abd pain diagnosed with UTI, was given antibiotics for 10 days, got better but developed recurrent severe left lower quadrant abdominal pain and went back to urgent care in October, CT scan showed evidence of diverticulitis, was given repeat course of antibiotics for 10 days. She went back to ER in [place] with fever and LLQ abd pain, was hospitalized for 6 days and was on IV antibiotics. She had a diagnostic colonoscopy about 10 years ago for evaluation of blood per rectum at [facility] , it was unremarkable at time . Report isn't available for review during this office visit . Denies IBD and colon cancer family history. She had lost about 20 pounds in the past few months when she was having severe left lower quadrant abdominal pain, but her weight has stabilized since December and thinks may have gained 3 or 4 pounds in the past month. She is currently asymptomatic. Denies any nausea, vomiting, abdominal pain, melena or bright red blood per rectum     Outpatient Encounter Prescriptions as of 11/07/2016  Medication Sig  . Ascorbic Acid (VITAMIN C) 1000 MG tablet Take 1,000 mg by mouth daily.  Marland Kitchen Cod Liver Oil CAPS Take 1 capsule by mouth every morning.  Marland Kitchen GARLIC OIL PO Take 1 tablet by mouth daily.  Marland Kitchen losartan (COZAAR) 25 MG tablet Take 1 tablet (25 mg total) by mouth daily.  . metFORMIN (GLUCOPHAGE) 1000 MG tablet Take 1 tablet (1,000 mg total) by mouth daily with breakfast.    . polyethylene glycol (MIRALAX / GLYCOLAX) packet Take 17 g by mouth daily as needed for moderate constipation.  . [DISCONTINUED] acetaminophen (TYLENOL) 325 MG tablet Take 2 tablets (650 mg total) by mouth every 6 (six) hours as needed for mild pain, moderate pain or fever (or Fever >/= 101).  . [DISCONTINUED] amoxicillin-clavulanate (AUGMENTIN) 875-125 MG tablet Take 1 tablet by mouth 2 (two) times daily.  . [DISCONTINUED] Dulaglutide (TRULICITY) A999333 0000000 SOPN Inject 1 application into the skin once a week. (Patient taking differently: Inject 1 application into the skin every Thursday. )   No facility-administered encounter medications on file as of 11/07/2016.     Allergies as of 11/07/2016 - Review Complete 11/07/2016  Allergen Reaction Noted  . Butyl acetate Other (See Comments) 08/08/2012    Past Medical History:  Diagnosis Date  . Complication of anesthesia    "takes me a long time to come out" (09/05/2016)  . Hypertension   . Pneumonia 1990's X 2; 2013  . Type II diabetes mellitus ([place])     Past Surgical History:  Procedure Laterality Date  . ABDOMINAL HYSTERECTOMY  1998  . CARPAL TUNNEL RELEASE Right ~ 2012  . TUBAL LIGATION  1994    Family History  Problem Relation Age of Onset  . Diabetes Mother   . Hyperlipidemia Father   . Hypertension Father   . Heart disease Maternal Grandmother   . Colon cancer Neg Hx   . Stomach cancer  Neg Hx   . Rectal cancer Neg Hx   . Esophageal cancer Neg Hx   . Liver cancer Neg Hx     Social History   Social History  . Marital status: Married    Spouse name: N/A  . Number of children: 2  . Years of education: N/A   Occupational History  . Environmental Specialist    Social History Main Topics  . Smoking status: Never Smoker  . Smokeless tobacco: Never Used  . Alcohol use No  . Drug use: No  . Sexual activity: Not Currently    Birth control/ protection: Surgical   Other Topics Concern  . Not on file   Social  History Narrative  . No narrative on file      Review of systems: Review of Systems  Constitutional: Negative for fever and chills.  HENT: Negative.   Eyes: Negative for blurred vision.  Respiratory: Negative for cough, shortness of breath and wheezing.   Cardiovascular: Negative for chest pain and palpitations.  Gastrointestinal: as per HPI Genitourinary: Negative for dysuria, urgency, frequency and hematuria.  Musculoskeletal: Negative for myalgias, back pain and joint pain.  Skin: Negative for itching and rash.  Neurological: Negative for dizziness, tremors, focal weakness, seizures and loss of consciousness.  Endo/Heme/Allergies: Positive for seasonal allergies.  Psychiatric/Behavioral: Negative for depression, suicidal ideas and hallucinations.  All other systems reviewed and are negative.   Physical Exam: Vitals:   11/07/16 0842  BP: 120/84  Pulse: 72   Body mass index is 36.24 kg/m. Gen:      No acute distress HEENT:  EOMI, sclera anicteric Neck:     No masses; no thyromegaly Lungs:    Clear to auscultation bilaterally; normal respiratory effort CV:         Regular rate and rhythm; no murmurs Abd:      + bowel sounds; soft, non-tender; no palpable masses, no distension Ext:    No edema; adequate peripheral perfusion Skin:      Warm and dry; no rash Neuro: alert and oriented x 3 Psych: normal mood and affect  Data Reviewed:  Reviewed labs, radiology imaging, old records and pertinent past GI work up   Assessment and Plan/Recommendations: 56 year old female here for evaluation after recent episode of diverticulitis in November 2017 that required hospitalization and IV antibiotics She is due for colonoscopy for colorectal cancer screening, will go ahead and schedule it The risks and benefits as well as alternatives of endoscopic procedure(s) have been discussed and reviewed. All questions answered. The patient agrees to proceed.  If patient continues to have  recurrent episodes of sigmoid diverticulitis, may have to consider segmental resection of sigmoid to prevent further episodes  Greater than 50% of the time used for counseling as well as treatment plan and follow-up. She had multiple questions which were answered to her satisfaction  K. Denzil Magnuson , MD [phone] Mon-Fri 8a-5p [phone] after 5p, weekends, holidays  CC: Girtha Rm, NP

## 2016-11-07 NOTE — Patient Instructions (Signed)

## 2016-11-11 ENCOUNTER — Ambulatory Visit: Payer: Managed Care, Other (non HMO) | Admitting: Family Medicine

## 2016-11-16 ENCOUNTER — Ambulatory Visit: Payer: Managed Care, Other (non HMO) | Admitting: Family Medicine

## 2016-11-16 ENCOUNTER — Encounter: Payer: Self-pay | Admitting: Gastroenterology

## 2016-11-16 ENCOUNTER — Ambulatory Visit (AMBULATORY_SURGERY_CENTER): Payer: Managed Care, Other (non HMO) | Admitting: Gastroenterology

## 2016-11-16 VITALS — BP 112/54 | HR 64 | Temp 95.3°F | Resp 15 | Ht 64.0 in | Wt 211.0 lb

## 2016-11-16 DIAGNOSIS — Z8719 Personal history of other diseases of the digestive system: Secondary | ICD-10-CM

## 2016-11-16 DIAGNOSIS — Z1211 Encounter for screening for malignant neoplasm of colon: Secondary | ICD-10-CM | POA: Diagnosis present

## 2016-11-16 DIAGNOSIS — Z1212 Encounter for screening for malignant neoplasm of rectum: Secondary | ICD-10-CM

## 2016-11-16 LAB — GLUCOSE, CAPILLARY
GLUCOSE-CAPILLARY: 94 mg/dL (ref 65–99)
Glucose-Capillary: 105 mg/dL — ABNORMAL HIGH (ref 65–99)

## 2016-11-16 MED ORDER — SODIUM CHLORIDE 0.9 % IV SOLN: 500.0000 mL | INTRAVENOUS | Status: DC

## 2016-11-16 NOTE — Op Note (Signed)
[facility] Patient Name: Jessica Pace Procedure Date: 11/16/2016 2:59 PM MRN: [id number] Endoscopist: Mauri Pole , MD Age: 56 Referring MD:  Date of Birth: May 21, 1961 Gender: Female Account #: [id number] Procedure:                Colonoscopy Indications:              Screening for colorectal malignant neoplasm Medicines:                Monitored Anesthesia Care Procedure:                Pre-Anesthesia Assessment:                           - Prior to the procedure, a History and Physical                            was performed, and patient medications and                            allergies were reviewed. The patient's tolerance of                            previous anesthesia was also reviewed. The risks                            and benefits of the procedure and the sedation                            options and risks were discussed with the patient.                            All questions were answered, and informed consent                            was obtained. Prior Anticoagulants: The patient has                            taken no previous anticoagulant or antiplatelet                            agents. ASA Grade Assessment: II - A patient with                            mild systemic disease. After reviewing the risks                            and benefits, the patient was deemed in                            satisfactory condition to undergo the procedure.                           After obtaining informed consent, the colonoscope  was passed under direct vision. Throughout the                            procedure, the patient's blood pressure, pulse, and                            oxygen saturations were monitored continuously. The                            Model PCF-H190L [id number]) scope was introduced                            through the anus and advanced to the the terminal                            ileum,  with identification of the appendiceal                            orifice and IC valve. The colonoscopy was performed                            without difficulty. The patient tolerated the                            procedure well. The quality of the bowel                            preparation was excellent. The terminal ileum,                            ileocecal valve, appendiceal orifice, and rectum                            were photographed. Scope In: 3:13:19 PM Scope Out: 3:23:58 PM Scope Withdrawal Time: 0 hours 7 minutes 29 seconds  Total Procedure Duration: 0 hours 10 minutes 39 seconds  Findings:                 The perianal and digital rectal examinations were                            normal.                           Multiple small and large-mouthed diverticula were                            found in the sigmoid colon and descending colon.                            There was no evidence of diverticular bleeding.                           Non-bleeding internal hemorrhoids were found during  retroflexion. The hemorrhoids were small.                           The exam was otherwise without abnormality. Complications:            No immediate complications. Estimated Blood Loss:     Estimated blood loss: none. Impression:               - Moderate diverticulosis in the sigmoid colon and                            in the descending colon. There was no evidence of                            diverticular bleeding.                           - Non-bleeding internal hemorrhoids.                           - The examination was otherwise normal.                           - No specimens collected. Recommendation:           - Patient has a contact number available for                            emergencies. The signs and symptoms of potential                            delayed complications were discussed with the                            patient. Return  to normal activities tomorrow.                            Written discharge instructions were provided to the                            patient.                           - Resume previous diet.                           - Continue present medications.                           - Repeat colonoscopy in 10 years for screening                            purposes.                           - Return to GI clinic PRN. Mauri Pole, MD 11/16/2016 3:28:34 PM This report has been signed electronically.

## 2016-11-16 NOTE — Progress Notes (Signed)
To recovery, report to Jones, RN, VSS 

## 2016-11-16 NOTE — Patient Instructions (Signed)
YOU HAD AN ENDOSCOPIC PROCEDURE TODAY AT [facility]:   Refer to the procedure report that was given to you for any specific questions about what was found during the examination.  If the procedure report does not answer your questions, please call your gastroenterologist to clarify.  If you requested that your care partner not be given the details of your procedure findings, then the procedure report has been included in a sealed envelope for you to review at your convenience later.  YOU SHOULD EXPECT: Some feelings of bloating in the abdomen. Passage of more gas than usual.  Walking can help get rid of the air that was put into your GI tract during the procedure and reduce the bloating. If you had a lower endoscopy (such as a colonoscopy or flexible sigmoidoscopy) you may notice spotting of blood in your stool or on the toilet paper. If you underwent a bowel prep for your procedure, you may not have a normal bowel movement for a few days.  Please Note:  You might notice some irritation and congestion in your nose or some drainage.  This is from the oxygen used during your procedure.  There is no need for concern and it should clear up in a day or so.  SYMPTOMS TO REPORT IMMEDIATELY:   Following lower endoscopy (colonoscopy or flexible sigmoidoscopy):  Excessive amounts of blood in the stool  Significant tenderness or worsening of abdominal pains  Swelling of the abdomen that is new, acute  Fever of 100F or higher  For urgent or emergent issues, a gastroenterologist can be reached at any hour by calling [phone].   DIET:  We do recommend a small meal at first, but then you may proceed to your regular diet.  Drink plenty of fluids but you should avoid alcoholic beverages for 24 hours.  ACTIVITY:  You should plan to take it easy for the rest of today and you should NOT DRIVE or use heavy machinery until tomorrow (because of the sedation medicines used during the test).     FOLLOW UP: Our staff will call the number listed on your records the next business day following your procedure to check on you and address any questions or concerns that you may have regarding the information given to you following your procedure. If we do not reach you, we will leave a message.  However, if you are feeling well and you are not experiencing any problems, there is no need to return our call.  We will assume that you have returned to your regular daily activities without incident.  If any biopsies were taken you will be contacted by phone or by letter within the next 1-3 weeks.  Please call us at [phone] if you have not heard about the biopsies in 3 weeks.    Repeat Colonoscopy in 10 years Hemorrhoids (handout given)   SIGNATURES/CONFIDENTIALITY: You and/or your care partner have signed paperwork which will be entered into your electronic medical record.  These signatures attest to the fact that that the information above on your After Visit Summary has been reviewed and is understood.  Full responsibility of the confidentiality of this discharge information lies with you and/or your care-partner.

## 2016-11-16 NOTE — Progress Notes (Signed)
Pt's states no medical or surgical changes since previsit or office visit. 

## 2016-11-17 ENCOUNTER — Telehealth: Payer: Self-pay | Admitting: *Deleted

## 2016-11-17 NOTE — Telephone Encounter (Signed)
  Follow up Call-  Call back number 11/16/2016  Post procedure Call Back phone  # [phone]  Permission to leave phone message Yes  Some recent data might be hidden     Patient questions:  Do you have a fever, pain , or abdominal swelling? No. Pain Score  0 *  Have you tolerated food without any problems? Yes.    Have you been able to return to your normal activities? Yes.    Do you have any questions about your discharge instructions: Diet   No. Medications  No. Follow up visit  No.  Do you have questions or concerns about your Care? No.  Actions: * If pain score is 4 or above: No action needed, pain <4.

## 2017-01-16 ENCOUNTER — Emergency Department (HOSPITAL_COMMUNITY)
Admission: EM | Admit: 2017-01-16 | Discharge: 2017-01-17 | Disposition: A | Discharge status: Home / Self Care | Payer: Managed Care, Other (non HMO) | Attending: Emergency Medicine | Admitting: Emergency Medicine

## 2017-01-16 ENCOUNTER — Encounter (HOSPITAL_COMMUNITY): Payer: Self-pay | Admitting: Emergency Medicine

## 2017-01-16 DIAGNOSIS — N390 Urinary tract infection, site not specified: Secondary | ICD-10-CM

## 2017-01-16 DIAGNOSIS — R300 Dysuria: Secondary | ICD-10-CM | POA: Diagnosis present

## 2017-01-16 DIAGNOSIS — Z7984 Long term (current) use of oral hypoglycemic drugs: Secondary | ICD-10-CM | POA: Diagnosis not present

## 2017-01-16 DIAGNOSIS — I10 Essential (primary) hypertension: Secondary | ICD-10-CM | POA: Insufficient documentation

## 2017-01-16 DIAGNOSIS — E1165 Type 2 diabetes mellitus with hyperglycemia: Secondary | ICD-10-CM | POA: Insufficient documentation

## 2017-01-16 DIAGNOSIS — E039 Hypothyroidism, unspecified: Secondary | ICD-10-CM | POA: Insufficient documentation

## 2017-01-16 DIAGNOSIS — Z79899 Other long term (current) drug therapy: Secondary | ICD-10-CM | POA: Diagnosis not present

## 2017-01-16 DIAGNOSIS — R739 Hyperglycemia, unspecified: Secondary | ICD-10-CM

## 2017-01-16 LAB — URINALYSIS, ROUTINE W REFLEX MICROSCOPIC
BACTERIA UA: NONE SEEN
BILIRUBIN URINE: NEGATIVE
HGB URINE DIPSTICK: NEGATIVE
KETONES UR: 5 mg/dL — AB
LEUKOCYTES UA: NEGATIVE
NITRITE: NEGATIVE
PROTEIN: NEGATIVE mg/dL
Specific Gravity, Urine: 1.028 (ref 1.005–1.030)
Squamous Epithelial / LPF: NONE SEEN
pH: 5 (ref 5.0–8.0)

## 2017-01-16 LAB — BASIC METABOLIC PANEL
ANION GAP: 10 (ref 5–15)
BUN: 16 mg/dL (ref 6–20)
CO2: 25 mmol/L (ref 22–32)
Calcium: 9.6 mg/dL (ref 8.9–10.3)
Chloride: 91 mmol/L — ABNORMAL LOW (ref 101–111)
Creatinine, Ser: 1.23 mg/dL — ABNORMAL HIGH (ref 0.44–1.00)
GFR, EST AFRICAN AMERICAN: 55 mL/min — AB (ref >60)
GFR, EST NON AFRICAN AMERICAN: 48 mL/min — AB (ref >60)
GLUCOSE: 734 mg/dL — AB (ref 65–99)
POTASSIUM: 4.6 mmol/L (ref 3.5–5.1)
Sodium: 126 mmol/L — ABNORMAL LOW (ref 135–145)

## 2017-01-16 LAB — CBC
HEMATOCRIT: 37.4 % (ref 36.0–46.0)
HEMOGLOBIN: 13.1 g/dL (ref 12.0–15.0)
MCH: 29.6 pg (ref 26.0–34.0)
MCHC: 35 g/dL (ref 30.0–36.0)
MCV: 84.4 fL (ref 78.0–100.0)
Platelets: 204 10*3/uL (ref 150–400)
RBC: 4.43 MIL/uL (ref 3.87–5.11)
RDW: 14.1 % (ref 11.5–15.5)
WBC: 6.3 10*3/uL (ref 4.0–10.5)

## 2017-01-16 LAB — CBG MONITORING, ED

## 2017-01-16 MED ORDER — INSULIN REGULAR BOLUS VIA INFUSION
0.0000 [IU] | Freq: Three times a day (TID) | INTRAVENOUS | Status: DC
  Filled 2017-01-16: qty 10

## 2017-01-16 MED ORDER — DEXTROSE-NACL 5-0.45 % IV SOLN: INTRAVENOUS | Status: DC

## 2017-01-16 MED ORDER — SODIUM CHLORIDE 0.9 % IV SOLN
INTRAVENOUS | Status: DC
  Administered 2017-01-17: 4.6 [IU]/h via INTRAVENOUS
  Filled 2017-01-16: qty 2.5

## 2017-01-16 MED ORDER — DEXTROSE 50 % IV SOLN: 25.0000 mL | INTRAVENOUS | Status: DC | PRN

## 2017-01-16 MED ORDER — SODIUM CHLORIDE 0.9 % IV BOLUS (SEPSIS)
1000.0000 mL | Freq: Once | INTRAVENOUS | Status: AC
  Administered 2017-01-16: 1000 mL via INTRAVENOUS

## 2017-01-16 MED ORDER — SODIUM CHLORIDE 0.9 % IV SOLN
INTRAVENOUS | Status: DC
  Administered 2017-01-16: via INTRAVENOUS

## 2017-01-16 NOTE — ED Provider Notes (Signed)
[facility] DEPT Provider Note   CSN: [id number] Arrival date & time: 01/16/17  2200   By signing my name below, I, Macon Large, attest that this documentation has been prepared under the direction and in the presence of Orpah Greek, MD. Electronically Signed: Macon Large, ED Scribe. 01/16/17. 12:11 AM.  History   Chief Complaint Chief Complaint  Patient presents with  . Hyperglycemia  . Dysuria   The history is provided by the patient and a relative. No language interpreter was used.   HPI Comments: Jessica Pace is a 56 y.o. female with PMHx of HTN, Type II DM who presents to the Emergency Department complaining of moderate, intermittent, dysuria onset three days ago. Pt reports associated frequency and urinary incontinence. She notes checking her CBG today and had a recorded high reading. Per nurse note, pt states she has been taking oral anti-diabetic agents as prescribed. She also complains of generalized weakness and fatigue over the past two days. Per pt's relative, she notes pt had one episode of diarrhea two days, but pt states it has subsided. No alleviating factors noted. Pt denies cough, congestion, vomiting, diarrhea, back pain.   Past Medical History:  Diagnosis Date  . Complication of anesthesia    "takes me a long time to come out" (09/05/2016)  . Hypertension   . Pneumonia 1990's X 2; 2013  . Type II diabetes mellitus [facility])     Patient Active Problem List   Diagnosis Date Noted  . Elevated LFTs 09/13/2016  . Diverticulitis 09/04/2016  . Diabetes mellitus type 2, controlled, without complications ([place]) 25/85/2778  . History of hypothyroidism 09/01/2015  . Obesity (BMI 30-39.9) 08/10/2015  . Essential hypertension 08/10/2015  . History of vitamin D deficiency 08/10/2015  . History of headache 08/10/2015    Past Surgical History:  Procedure Laterality Date  . ABDOMINAL HYSTERECTOMY  1998  . CARPAL TUNNEL RELEASE Right ~ 2012  . TUBAL  LIGATION  1994    OB History    No data available       Home Medications    Prior to Admission medications   Medication Sig Start Date End Date Taking? Authorizing Provider  Ascorbic Acid (VITAMIN C) 1000 MG tablet Take 1,000 mg by mouth daily.   Yes Historical Provider, MD  [facility] Liver Oil CAPS Take 1 capsule by mouth every morning.   Yes Historical Provider, MD  GARLIC OIL PO Take 1 tablet by mouth daily.   Yes Historical Provider, MD  losartan (COZAAR) 25 MG tablet Take 1 tablet (25 mg total) by mouth daily. 02/29/16  Yes Camelia Eng Tysinger, PA-C  metFORMIN (GLUCOPHAGE) 1000 MG tablet Take 1 tablet (1,000 mg total) by mouth daily with breakfast. 03/01/16  Yes Camelia Eng Tysinger, PA-C  polyethylene glycol (MIRALAX / GLYCOLAX) packet Take 17 g by mouth daily as needed for moderate constipation.   Yes Historical Provider, MD    Family History Family History  Problem Relation Age of Onset  . Diabetes Mother   . Hyperlipidemia Father   . Hypertension Father   . Heart disease Maternal Grandmother   . Colon cancer Neg Hx   . Stomach cancer Neg Hx   . Rectal cancer Neg Hx   . Esophageal cancer Neg Hx   . Liver cancer Neg Hx     Social History Social History  Substance Use Topics  . Smoking status: Never Smoker  . Smokeless tobacco: Never Used  . Alcohol use No     Allergies  Butyl acetate   Review of Systems Review of Systems  Constitutional: Positive for fatigue.  HENT: Negative for congestion.   Respiratory: Negative for cough.   Gastrointestinal: Negative for diarrhea and vomiting.  Genitourinary: Positive for dysuria and frequency.  Musculoskeletal: Negative for back pain.  Neurological: Positive for weakness.  All other systems reviewed and are negative.    Physical Exam Updated Vital Signs BP 120/75   Pulse 66   Temp 97.9 F (36.6 C) (Oral)   Resp 16   SpO2 98%   Physical Exam  Constitutional: She is oriented to person, place, and time. She appears  well-developed and well-nourished. No distress.  HENT:  Head: Normocephalic and atraumatic.  Right Ear: Hearing normal.  Left Ear: Hearing normal.  Nose: Nose normal.  Mouth/Throat: Oropharynx is clear and moist and mucous membranes are normal.  Eyes: Conjunctivae and EOM are normal. Pupils are equal, round, and reactive to light.  Neck: Normal range of motion. Neck supple.  Cardiovascular: Regular rhythm, S1 normal and S2 normal.  Exam reveals no gallop and no friction rub.   No murmur heard. Pulmonary/Chest: Effort normal and breath sounds normal. No respiratory distress. She exhibits no tenderness.  Abdominal: Soft. Normal appearance and bowel sounds are normal. There is no hepatosplenomegaly. There is no tenderness. There is no rebound, no guarding, no tenderness at McBurney's point and negative Murphy's sign. No hernia.  Musculoskeletal: Normal range of motion.  Neurological: She is alert and oriented to person, place, and time. She has normal strength. No cranial nerve deficit or sensory deficit. Coordination normal. GCS eye subscore is 4. GCS verbal subscore is 5. GCS motor subscore is 6.  Skin: Skin is warm, dry and intact. No rash noted. No cyanosis.  Psychiatric: She has a normal mood and affect. Her speech is normal and behavior is normal. Thought content normal.  Nursing note and vitals reviewed.    ED Treatments / Results   DIAGNOSTIC STUDIES: Oxygen Saturation is 98% on RA, normal by my interpretation.    COORDINATION OF CARE: 11:59 PM Discussed treatment plan with pt at bedside which includes labs and IV insulin and glucose and pt agreed to plan.   Labs (all labs ordered are listed, but only abnormal results are displayed) Labs Reviewed  BASIC METABOLIC PANEL - Abnormal; Notable for the following:       Result Value   Sodium 126 (*)    Chloride 91 (*)    Glucose, Bld 734 (*)    Creatinine, Ser 1.23 (*)    GFR calc non Af Amer 48 (*)    GFR calc Af Amer 55 (*)      All other components within normal limits  URINALYSIS, ROUTINE W REFLEX MICROSCOPIC - Abnormal; Notable for the following:    Color, Urine AMBER (*)    Glucose, UA >=500 (*)    Ketones, ur 5 (*)    All other components within normal limits  CBG MONITORING, ED - Abnormal; Notable for the following:    Glucose-Capillary >600 (*)    All other components within normal limits  CBG MONITORING, ED - Abnormal; Notable for the following:    Glucose-Capillary 527 (*)    All other components within normal limits  CBG MONITORING, ED - Abnormal; Notable for the following:    Glucose-Capillary 451 (*)    All other components within normal limits  CBG MONITORING, ED - Abnormal; Notable for the following:    Glucose-Capillary 356 (*)    All  other components within normal limits  CBG MONITORING, ED - Abnormal; Notable for the following:    Glucose-Capillary 311 (*)    All other components within normal limits  URINE CULTURE  CBC    EKG  EKG Interpretation None       Radiology No results found.  Procedures Procedures (including critical care time)  Medications Ordered in ED Medications  dextrose 5 %-0.45 % sodium chloride infusion ( Intravenous Hold 01/16/17 2357)  insulin regular bolus via infusion 0-10 Units (not administered)  insulin regular (NOVOLIN R,HUMULIN R) 250 Units in sodium chloride 0.9 % 250 mL (1 Units/mL) infusion (5.9 Units/hr Intravenous Rate/Dose Change 01/17/17 0240)  dextrose 50 % solution 25 mL (not administered)  0.9 %  sodium chloride infusion ( Intravenous New Bag/Given 01/16/17 2356)  cephALEXin (KEFLEX) capsule 500 mg (not administered)  fluconazole (DIFLUCAN) tablet 150 mg (not administered)  sodium chloride 0.9 % bolus 1,000 mL (1,000 mLs Intravenous New Bag/Given 01/16/17 2356)     Initial Impression / Assessment and Plan / ED Course  I have reviewed the triage vital signs and the nursing notes.  Pertinent labs & imaging results that were available during  my care of the patient were reviewed by me and considered in my medical decision making (see chart for details).     Patient presents to the emergency department for evaluation of elevated blood sugar. Patient reports that for the last couple of days she has had increased urinary frequency with some dysuria and urinary incontinence. Today she noticed her blood sugar was elevated. She does have a history of diabetes, takes Glucophage 1000 mg daily.  His workup was otherwise unremarkable. Blood work unremarkable other than hyper glycemia. Urinalysis equivocal, but with symptoms will treat empirically. Will cover for yeast as well. Patient was put on glucose stabilizer and slowly improved and was hydrated. She will be discharged with follow-up with her primary doctor, take Glucophage twice daily until follow-up.  Final Clinical Impressions(s) / ED Diagnoses   Final diagnoses:  Lower urinary tract infectious disease  Hyperglycemia    New Prescriptions New Prescriptions   No medications on file    I personally performed the services described in this documentation, which was scribed in my presence. The recorded information has been reviewed and is accurate.     Orpah Greek, MD 01/17/17 [phone]

## 2017-01-16 NOTE — ED Triage Notes (Signed)
Pt to ED from home c/o urinary symptoms since Friday night - pt reports burning, frequency and inability to control bladder a couple of times. Pt also states checking her CBG today and it read "HIGH." Pt has been taking oral anti-diabetic agents as prescribed. Pt also reporting generalized weakness and fatigue onset this past weekend.

## 2017-01-16 NOTE — ED Notes (Signed)
Dr. Venita Sheffield notified on pt.'s elevated blood glucose .

## 2017-01-17 LAB — CBG MONITORING, ED
GLUCOSE-CAPILLARY: 451 mg/dL — AB (ref 65–99)
GLUCOSE-CAPILLARY: 311 mg/dL — AB (ref 65–99)
Glucose-Capillary: 356 mg/dL — ABNORMAL HIGH (ref 65–99)
Glucose-Capillary: 527 mg/dL (ref 65–99)

## 2017-01-17 MED ORDER — FLUCONAZOLE 100 MG PO TABS
150.0000 mg | ORAL_TABLET | Freq: Once | ORAL | Status: AC
  Administered 2017-01-17: 150 mg via ORAL
  Filled 2017-01-17: qty 2

## 2017-01-17 MED ORDER — CEPHALEXIN 250 MG PO CAPS
500.0000 mg | ORAL_CAPSULE | Freq: Once | ORAL | Status: AC
  Administered 2017-01-17: 500 mg via ORAL
  Filled 2017-01-17: qty 2

## 2017-01-17 NOTE — Discharge Instructions (Signed)
Take Glucophage twice daily until you follow-up with your doctor later this week.

## 2017-01-18 ENCOUNTER — Encounter: Payer: Self-pay | Admitting: Family Medicine

## 2017-01-18 ENCOUNTER — Ambulatory Visit (INDEPENDENT_AMBULATORY_CARE_PROVIDER_SITE_OTHER): Payer: Managed Care, Other (non HMO) | Admitting: Family Medicine

## 2017-01-18 VITALS — BP 122/82 | HR 74 | Wt 205.6 lb

## 2017-01-18 DIAGNOSIS — I10 Essential (primary) hypertension: Secondary | ICD-10-CM

## 2017-01-18 DIAGNOSIS — E1165 Type 2 diabetes mellitus with hyperglycemia: Secondary | ICD-10-CM | POA: Diagnosis not present

## 2017-01-18 DIAGNOSIS — Z8639 Personal history of other endocrine, nutritional and metabolic disease: Secondary | ICD-10-CM | POA: Diagnosis not present

## 2017-01-18 LAB — COMPLETE METABOLIC PANEL WITH GFR
ALT: 31 U/L — AB (ref 6–29)
AST: 16 U/L (ref 10–35)
Albumin: 4.2 g/dL (ref 3.6–5.1)
Alkaline Phosphatase: 86 U/L (ref 33–130)
BILIRUBIN TOTAL: 0.4 mg/dL (ref 0.2–1.2)
BUN: 13 mg/dL (ref 7–25)
CALCIUM: 9.2 mg/dL (ref 8.6–10.4)
CHLORIDE: 101 mmol/L (ref 98–110)
CO2: 24 mmol/L (ref 20–31)
CREATININE: 0.9 mg/dL (ref 0.50–1.05)
GFR, EST AFRICAN AMERICAN: 82 mL/min (ref >=60)
GFR, EST NON AFRICAN AMERICAN: 71 mL/min (ref >=60)
Glucose, Bld: 334 mg/dL — ABNORMAL HIGH (ref 65–99)
Potassium: 4.3 mmol/L (ref 3.5–5.3)
Sodium: 136 mmol/L (ref 135–146)
TOTAL PROTEIN: 7.7 g/dL (ref 6.1–8.1)

## 2017-01-18 LAB — LIPID PANEL
CHOLESTEROL: 194 mg/dL (ref <200)
HDL: 25 mg/dL — ABNORMAL LOW (ref >50)
LDL Cholesterol: 97 mg/dL (ref <100)
Total CHOL/HDL Ratio: 7.8 Ratio — ABNORMAL HIGH (ref <5.0)
Triglycerides: 361 mg/dL — ABNORMAL HIGH (ref <150)
VLDL: 72 mg/dL — ABNORMAL HIGH (ref <30)

## 2017-01-18 LAB — GLUCOSE, POCT (MANUAL RESULT ENTRY): POC Glucose: 344 mg/dl — AB (ref 70–99)

## 2017-01-18 LAB — URINE CULTURE: CULTURE: NO GROWTH

## 2017-01-18 LAB — TSH: TSH: 1.99 m[IU]/L

## 2017-01-18 LAB — POCT GLYCOSYLATED HEMOGLOBIN (HGB A1C)

## 2017-01-18 MED ORDER — METFORMIN HCL 1000 MG PO TABS: 1000.0000 mg | ORAL_TABLET | Freq: Two times a day (BID) | ORAL | 3 refills | Status: DC

## 2017-01-18 MED ORDER — PEN NEEDLES 32G X 4 MM MISC: 1.0000 | Freq: Every day | 1 refills | Status: DC

## 2017-01-18 MED ORDER — ATORVASTATIN CALCIUM 40 MG PO TABS: 40.0000 mg | ORAL_TABLET | Freq: Every day | ORAL | 2 refills | Status: DC

## 2017-01-18 MED ORDER — INSULIN GLARGINE 100 UNIT/ML SOLOSTAR PEN: 10.0000 [IU] | PEN_INJECTOR | Freq: Every day | SUBCUTANEOUS | 1 refills | Status: DC

## 2017-01-18 MED ORDER — ONETOUCH DELICA LANCETS FINE MISC: 5 refills | Status: AC

## 2017-01-18 MED ORDER — BLOOD GLUCOSE TEST VI STRP: ORAL_STRIP | 5 refills | Status: AC

## 2017-01-18 NOTE — Patient Instructions (Addendum)
Start checking your blood sugars twice daily.  Once fasting and 2 hours AFTER a meal.   Keep a record of your readings. Call me in one week and give me your readings.   Start taking Metformin 1, 000 mg twice daily with meals.  Take Lantus every evening around the same time and start at 10 units for one week. We may need to increase this dose if your blood sugars are not coming down.   Call and schedule an appointment with the nutritionist.  Start checking nutrition labels and keep an eye on the sugar and carbohydrates you are eating.  Walk an extra 10 minutes daily and build up.   We will call you with lab results.

## 2017-01-18 NOTE — Progress Notes (Signed)
Subjective:    Patient ID: Jessica Pace, female    DOB: 1961-05-11, 56 y.o.   MRN: [id number]  Jessica Pace is a 56 y.o. female who presents for follow-up of Type 2 diabetes mellitus. She was diagnosed with DM2 in the fall of 2013.   She was having urinary symptoms and hyperglycemia so she went to the ED 2 days ago. She was treated with an antibiotic but told she did not have a UTI so she did not get a prescription for an antibiotic.   She stopped her Trulicity in mid November and states she thinks it was causing her GI issues. She did not let me know. She only checks her blood sugars sporadically and not as recommended.   States last night her BS was 470 but this was only 1 hour after eating.   Denies fever, chills, vision issues, dizziness, chest pain, palpitations, shortness of breath, vomiting, diarrhea.   Patient is checking home blood sugars.   Home blood sugar records: has only been checking her blood sugars 2-3 times per week and 1 hour after eating How often is blood sugars being checked: 2-3 times per week.  Current symptoms include: increased thirst and urinary frequency. Patient denies foot ulcerations, hypoglycemia , increased appetite, nausea, paresthesia of the feet, visual disturbances, vomiting and weight loss.  Patient is checking their feet daily. Any Foot concerns (callous, ulcer, wound, thickened nails, toenail fungus, skin fungus, hammer toe): none  Last dilated eye exam: October or November 2017. States her vision has actually improved since November.   Current treatments: she stopped Truliicty due to GI upset and was been taking Metormin once daily. Medication compliance: fair  Current diet: in general, an "unhealthy" diet Current exercise: no regular exercise Known diabetic complications: none  The following portions of the patient's history were reviewed and updated as appropriate: allergies, current medications, past medical history, past social history and  problem list.  ROS as in subjective above.     Objective:    Physical Exam Alert and in no distress.  Pharyngeal area is normal. Neck is supple without adenopathy or thyromegaly. Cardiac exam shows a regular sinus rhythm without murmurs or gallops. Lungs are clear to auscultation.  POCT 344   Blood pressure 122/82, pulse 74, weight 205 lb 9.6 oz (93.3 kg).  Lab Review Diabetic Labs Latest Ref Rng & Units 01/18/2017 01/16/2017 09/12/2016 09/07/2016 09/06/2016  HbA1c - 10.5% - 7.7% - -  Microalbumin Not estab mg/dL - - - - -  Micro/Creat Ratio <30 mcg/mg creat - - - - -  Chol 125 - 200 mg/dL - - - - -  HDL >=46 mg/dL - - - - -  Calc LDL <130 mg/dL - - - - -  Triglycerides <150 mg/dL - - - - -  Creatinine 0.44 - 1.00 mg/dL - 1.23(H) 0.76 0.76 0.72   BP/Weight 01/18/2017 01/17/2017 11/16/2016 11/07/2016 96/29/5284  Systolic BP 132 440 102 725 366  Diastolic BP 82 74 54 84 60  Wt. (Lbs) 205.6 - 211 211.13 209.4  BMI 35.29 - 36.22 36.24 35.94   Foot/eye exam completion dates 02/29/2016  Foot Form Completion Done    Jessica Pace  reports that she has never smoked. She has never used smokeless tobacco. She reports that she does not drink alcohol or use drugs.     Assessment & Plan:    Uncontrolled type 2 diabetes mellitus with hyperglycemia, unspecified long term insulin use status (HCC) - Plan: COMPLETE METABOLIC PANEL WITH  GFR, TSH, POCT glucose (manual entry), POCT glycosylated hemoglobin (Hb A1C), Amb ref to Medical Nutrition Therapy-MNT, metFORMIN (GLUCOPHAGE) 1000 MG tablet, Lipid panel  Essential hypertension - Plan: Amb ref to Medical Nutrition Therapy-MNT  History of hypothyroidism - Plan: TSH  1. Rx changes: increased Metformin to twice daily. she stopped Trulicity due to suspected GI upset. will add on Lantus 10 units daily  Her hemoglobin A1c is not well controlled and is now 10.5%. Discussed that this puts her at an increased risk of worsening health and risk for heart attack,  stroke and death. Stressed importance of controlling HTN, diabetes and other chronic issues.  2. Education: Reviewed 'ABCs' of diabetes management (respective goals in parentheses):  A1C (<7), blood pressure (<130/80), and cholesterol (LDL <100). 3. Compliance at present is estimated to be fair. Efforts to improve compliance (if necessary) will be directed at dietary modifications: read nutrition labels and start counting carbs and limit sugar intake, increased exercise, regular blood sugar monitoring: twice times daily and referral to MNT to help with diet. will start her on a statin to protect against heart disease. will check lipids today.   4. BP is within goal range. Continue on current medication.  5. Follow up: 2 weeks  6. Educated her on when to check blood sugars. she was given a new meter today and advised her to call us when she has questions regarding medications, blood sugars or supplies. She did not let me know when she stopped her trulicity and advised that I can only help her if she communicates her concerns to me.

## 2017-01-24 ENCOUNTER — Telehealth: Payer: Self-pay | Admitting: Family Medicine

## 2017-01-24 NOTE — Telephone Encounter (Signed)
Pt called stating that she was to report glucose info directly to [facility] today. Pt did not want to leave a message. She can be reached at [phone]

## 2017-01-24 NOTE — Telephone Encounter (Signed)
Please call her and help her with this info. Find out if she has questions for me.

## 2017-01-24 NOTE — Telephone Encounter (Signed)
Spoke to patient about BS. Morning BS she was fasting, then afternoon or night time was 2 hours after eating  4/4 @ 3:30pm- 379            9:00pm- 468  4/5 @ 7:00am- 328             3:00- 421             9:30- 442  4/6 @  6:00am- 337             3:30- 502             10:00- 354  4/7  @ 8:00am- 308             4:00- 364  4/8 @ 8:00am- 326            1:00pm- 447  4/9 @ 6:30am- 401            5:00- 365  4/10  @ 6:30am- 412           9:00am- 456           11:50am- 369 (had pickles around 10am)

## 2017-01-24 NOTE — Telephone Encounter (Signed)
  I would like to see her back for an office visit. See if she can come in tomorrow.

## 2017-01-24 NOTE — Telephone Encounter (Signed)
Pt is coming in Friday to be seen

## 2017-01-27 ENCOUNTER — Ambulatory Visit (INDEPENDENT_AMBULATORY_CARE_PROVIDER_SITE_OTHER): Payer: Managed Care, Other (non HMO) | Admitting: Family Medicine

## 2017-01-27 ENCOUNTER — Encounter: Payer: Self-pay | Admitting: Family Medicine

## 2017-01-27 VITALS — BP 120/60 | HR 68 | Resp 16

## 2017-01-27 DIAGNOSIS — Z794 Long term (current) use of insulin: Secondary | ICD-10-CM | POA: Diagnosis not present

## 2017-01-27 DIAGNOSIS — I10 Essential (primary) hypertension: Secondary | ICD-10-CM

## 2017-01-27 DIAGNOSIS — E1165 Type 2 diabetes mellitus with hyperglycemia: Secondary | ICD-10-CM

## 2017-01-27 DIAGNOSIS — Z8639 Personal history of other endocrine, nutritional and metabolic disease: Secondary | ICD-10-CM | POA: Diagnosis not present

## 2017-01-27 DIAGNOSIS — E669 Obesity, unspecified: Secondary | ICD-10-CM | POA: Diagnosis not present

## 2017-01-27 DIAGNOSIS — IMO0001 Reserved for inherently not codable concepts without codable children: Secondary | ICD-10-CM

## 2017-01-27 MED ORDER — INSULIN GLARGINE 100 UNIT/ML SOLOSTAR PEN: 14.0000 [IU] | PEN_INJECTOR | Freq: Every day | SUBCUTANEOUS | 1 refills | Status: DC

## 2017-01-27 NOTE — Patient Instructions (Signed)
Increase Lantus to 14 units tonight and increase by 2 units every 2 days until fasting blood sugar is <130.   I am referring you to the endocrinologist and they will call you to schedule an appointment.   Call me next Wednesday with your blood sugar readings.

## 2017-01-27 NOTE — Progress Notes (Signed)
Subjective:    Patient ID: Jessica Pace, female    DOB: November 08, 1960, 56 y.o.   MRN: [id number]  HPI Chief Complaint  Patient presents with  . Diabetes    f/u blood sugar. Reports blood sugars running around 271-502. pt has no concerns.    She is here to discuss medication regimen for uncontrolled diabetes type 2. She has recently had uncontrolled blood sugars since hospitalization for diverticulitis in December.  Apparently her GI stopped her Trulicity due to possible side effects such as nausea and diarrhea. Prior to that her blood sugars were better controlled.  Recent blood sugars have been between 271- 300 fasting and up to 500 2 hours after meals.   She also attributes recent elevated sugars to having a diet change due to diverticulitis and she was eating more carbohydrates.   Denies fever, chills, dizziness, headache, blurred or double vision, polyuria, polyphagia, polydipsia.    She reports good compliance on Metformin 1,000 mg twice daily and Lantus 10 units.   Prior to establishing with me, she was taking Januvia and did not tolerate this medication and it was too expensive per patient. She reports recurrent yeast infections with SGLT2s. Trulicity was stopped by her GI for suspected GI side effects.   She is not interested in checking her blood sugars 3-4 times per day in order to start meal time insulin.   She would like to be referred to an endocrinologist. Denies ever seeing one in the past.   She did see a nutritionist at [facility] in February 2017.   She has a history of hypothyroidism per her records from her previous PCP at [facility]. Her recent TSH was normal.   Past Medical History:  Diagnosis Date  . Complication of anesthesia    "takes me a long time to come out" (09/05/2016)  . Hypertension   . Pneumonia 1990's X 2; 2013  . Type II diabetes mellitus ([place])    Past Surgical History:  Procedure Laterality Date  .  ABDOMINAL HYSTERECTOMY  1998  . CARPAL TUNNEL RELEASE Right ~ 2012  . TUBAL LIGATION  1994   Review of Systems Pertinent positives and negatives in the history of present illness.     Objective:   Physical Exam BP 120/60   Pulse 68   Resp 16   SpO2 98%   Alert and oriented and in no acute distress. Not otherwise examined.       Assessment & Plan:  Uncontrolled type 2 diabetes mellitus without complication, with long-term current use of insulin ([place]) - Plan: Ambulatory referral to Endocrinology  Essential hypertension  Obesity (BMI 30-39.9) - Plan: Ambulatory referral to Endocrinology  History of hypothyroidism - Plan: Ambulatory referral to Endocrinology  Discussed treatment options such as adding a DPP4 or possibly trying a different GLP1. Discussed that we are limited in regards to treatment since she has not tolerated several diabetes medications in the past. She appeared interested in meal time insulin but when I discussed how often she would need to check her blood sugars she became less interested in doing this. She would like a referral to endocrinology and I think this is a good idea as well. Referral made.  Plan to increase her Lantus to 14 units tonight and then she will increase by 2 units every 2 days until her FBS is <130. Patient verbalized understanding.  HTN- BP is within goal. Continue on current medication.  Counseled on weight loss by cutting  back on carbohydrates and sugar and being more active.  She will call me next Wednesday with her blood sugar readings.   Spent at least 25 minutes with patient and at least 50% was in counseling and coordination of care.

## 2017-02-01 ENCOUNTER — Ambulatory Visit: Payer: Managed Care, Other (non HMO) | Admitting: Family Medicine

## 2017-02-02 ENCOUNTER — Encounter: Payer: Self-pay | Admitting: Family Medicine

## 2017-02-13 ENCOUNTER — Ambulatory Visit (INDEPENDENT_AMBULATORY_CARE_PROVIDER_SITE_OTHER): Payer: Managed Care, Other (non HMO) | Admitting: Endocrinology

## 2017-02-13 ENCOUNTER — Encounter: Payer: Self-pay | Admitting: Endocrinology

## 2017-02-13 VITALS — BP 128/80 | HR 58 | Ht 64.0 in | Wt 206.0 lb

## 2017-02-13 DIAGNOSIS — Z794 Long term (current) use of insulin: Secondary | ICD-10-CM | POA: Diagnosis not present

## 2017-02-13 DIAGNOSIS — R197 Diarrhea, unspecified: Secondary | ICD-10-CM

## 2017-02-13 DIAGNOSIS — E1165 Type 2 diabetes mellitus with hyperglycemia: Secondary | ICD-10-CM | POA: Diagnosis not present

## 2017-02-13 DIAGNOSIS — IMO0001 Reserved for inherently not codable concepts without codable children: Secondary | ICD-10-CM

## 2017-02-13 MED ORDER — INSULIN GLARGINE 100 UNIT/ML SOLOSTAR PEN: 30.0000 [IU] | PEN_INJECTOR | Freq: Every day | SUBCUTANEOUS | 1 refills | Status: DC

## 2017-02-13 MED ORDER — METFORMIN HCL ER 500 MG PO TB24: 2000.0000 mg | ORAL_TABLET | Freq: Every day | ORAL | 11 refills | Status: DC

## 2017-02-13 NOTE — Patient Instructions (Addendum)
good diet and exercise significantly improve the control of your diabetes.  please let me know if you wish to be referred to a dietician.  high blood sugar is very risky to your health.  you should see an eye doctor and dentist every year.  It is very important to get all recommended vaccinations.  Controlling your blood pressure and cholesterol drastically reduces the damage diabetes does to your body.  Those who smoke should quit.  Please discuss these with your doctor.  check your blood sugar twice a day.  vary the time of day when you check, between before the 3 meals, and at bedtime.  also check if you have symptoms of your blood sugar being too high or too low.  please keep a record of the readings and bring it to your next appointment here (or you can bring the meter itself).  You can write it on any piece of paper.  please call us sooner if your blood sugar goes below 70, or if you have a lot of readings over 200.  I have sent a prescription to your pharmacy, to change the metformin to extended-release, and: Please increase the lantus 30 units each morning.   Please come back for a follow-up appointment in 1 month. Please call us in a few days, to tell us how the blood sugar is doing.

## 2017-02-13 NOTE — Progress Notes (Signed)
Subjective:    Patient ID: Jessica Pace, female    DOB: 1960-11-24, 56 y.o.   MRN: [id number]  HPI pt is referred by Harland Dingwall, NP, for diabetes.  Pt states DM was dx'ed in 2012; she has mild if any neuropathy of the lower extremities; she is unaware of any associated chronic complications; she has been back on insulin since 2 weeks ago; pt says her diet and exercise are good; she has never had GDM, pancreatitis, pancreatic surgery, severe hypoglycemia or DKA (but she recently had an episode of nonketotic hyperosmolar hyperglycemic state).  She did not tolerate trulicity (diarrhea).  She takes metformin and lantus 20 units qhs. She says cbg's are still in the 200's.   Past Medical History:  Diagnosis Date  . Complication of anesthesia    "takes me a long time to come out" (09/05/2016)  . Hypertension   . Pneumonia 1990's X 2; 2013  . Type II diabetes mellitus ([place])     Past Surgical History:  Procedure Laterality Date  . ABDOMINAL HYSTERECTOMY  1998  . CARPAL TUNNEL RELEASE Right ~ 2012  . TUBAL LIGATION  1994    Social History   Social History  . Marital status: Married    Spouse name: N/A  . Number of children: 2  . Years of education: N/A   Occupational History  . Environmental Specialist    Social History Main Topics  . Smoking status: Never Smoker  . Smokeless tobacco: Never Used  . Alcohol use No  . Drug use: No  . Sexual activity: Not Currently    Birth control/ protection: Surgical   Other Topics Concern  . Not on file   Social History Narrative  . No narrative on file    Current Outpatient Prescriptions on File Prior to Visit  Medication Sig Dispense Refill  . Cod Liver Oil CAPS Take 1 capsule by mouth every morning.    Marland Kitchen GARLIC OIL PO Take 1 tablet by mouth daily.    . Glucose Blood (BLOOD GLUCOSE TEST STRIPS) STRP Test twice a day. Pt uses one touch verio meter 100 each 5  . Insulin Pen Needle (PEN NEEDLES) 32G X 4 MM MISC 1 each by Other route  daily. Use with lantus nightly 100 each 1  . losartan (COZAAR) 25 MG tablet Take 1 tablet (25 mg total) by mouth daily. 90 tablet 3  . ONETOUCH DELICA LANCETS FINE MISC Test twice a daily. Pt uses a one touch verio meter 100 each 5  . Ascorbic Acid (VITAMIN C) 1000 MG tablet Take 1,000 mg by mouth daily.    Marland Kitchen atorvastatin (LIPITOR) 40 MG tablet Take 1 tablet (40 mg total) by mouth daily. (Patient not taking: Reported on 02/13/2017) 30 tablet 2  . polyethylene glycol (MIRALAX / GLYCOLAX) packet Take 17 g by mouth daily as needed for moderate constipation.     Current Facility-Administered Medications on File Prior to Visit  Medication Dose Route Frequency Provider Last Rate Last Dose  . 0.9 %  sodium chloride infusion  500 mL Intravenous Continuous Mauri Pole, MD        Allergies  Allergen Reactions  . Butyl Acetate Other (See Comments)    In paints and nail polish- unknown    Family History  Problem Relation Age of Onset  . Diabetes Mother   . Hyperlipidemia Father   . Hypertension Father   . Heart disease Maternal Grandmother   . Colon cancer Neg Hx   .  Stomach cancer Neg Hx   . Rectal cancer Neg Hx   . Esophageal cancer Neg Hx   . Liver cancer Neg Hx     BP 128/80   Pulse (!) 58   Ht 5\' 4"  (1.626 m)   Wt 206 lb (93.4 kg)   SpO2 98%   BMI 35.36 kg/m    Review of Systems denies, blurry vision, headache, chest pain, sob, n/v, urinary frequency, excessive diaphoresis, depression, cold intolerance, rhinorrhea, and easy bruising. She lost weight in 2017, but it has stabilized.  She has leg cramps and easy bruising.      Objective:   Physical Exam VS: see vs page GEN: no distress HEAD: head: no deformity eyes: no periorbital swelling, no proptosis external nose and ears are normal mouth: no lesion seen NECK: supple, thyroid is not enlarged.  CHEST WALL: no deformity LUNGS: clear to auscultation CV: reg rate and rhythm, no murmur ABD: abdomen is soft,  nontender.  no hepatosplenomegaly.  not distended.  no hernia MUSCULOSKELETAL: muscle bulk and strength are grossly normal.  no obvious joint swelling.  gait is normal and steady EXTEMITIES: no deformity.  no ulcer on the feet.  feet are of normal color and temp.  no edema.  There is bilateral onychomycosis of the toenails.  PULSES: dorsalis pedis intact bilat.  no carotid bruit NEURO:  cn 2-12 grossly intact.   readily moves all 4's.  sensation is intact to touch on the feet SKIN:  Normal texture and temperature.  No rash or suspicious lesion is visible.   NODES:  None palpable at the neck PSYCH: alert, well-oriented.  Does not appear anxious nor depressed.  Lab Results  Component Value Date   HGBA1C 10.5% 01/18/2017   I have reviewed outside records, and summarized: Pt was noted to have severely elevated a1c, and referred here.  She was seen in ER for nonketotic hyperosmolar hyperglycemic state.  Chief complaint was dysuria.     Assessment & Plan:  Insulin-requiring type 2 DM: severe exacerbation.  Diarrhea, new to me: we'll change to metformin-XR, so we can re-try trulicity if necessary.   Patient Instructions  good diet and exercise significantly improve the control of your diabetes.  please let me know if you wish to be referred to a dietician.  high blood sugar is very risky to your health.  you should see an eye doctor and dentist every year.  It is very important to get all recommended vaccinations.  Controlling your blood pressure and cholesterol drastically reduces the damage diabetes does to your body.  Those who smoke should quit.  Please discuss these with your doctor.  check your blood sugar twice a day.  vary the time of day when you check, between before the 3 meals, and at bedtime.  also check if you have symptoms of your blood sugar being too high or too low.  please keep a record of the readings and bring it to your next appointment here (or you can bring the meter itself).   You can write it on any piece of paper.  please call us sooner if your blood sugar goes below 70, or if you have a lot of readings over 200.  I have sent a prescription to your pharmacy, to change the metformin to extended-release, and: Please increase the lantus 30 units each morning.   Please come back for a follow-up appointment in 1 month. Please call us in a few days, to tell us how the  blood sugar is doing.

## 2017-03-15 ENCOUNTER — Ambulatory Visit (INDEPENDENT_AMBULATORY_CARE_PROVIDER_SITE_OTHER): Payer: Managed Care, Other (non HMO) | Admitting: Endocrinology

## 2017-03-15 ENCOUNTER — Encounter: Payer: Self-pay | Admitting: Endocrinology

## 2017-03-15 VITALS — BP 126/84 | HR 74 | Ht 64.0 in | Wt 208.0 lb

## 2017-03-15 DIAGNOSIS — Z794 Long term (current) use of insulin: Secondary | ICD-10-CM

## 2017-03-15 DIAGNOSIS — E1165 Type 2 diabetes mellitus with hyperglycemia: Secondary | ICD-10-CM | POA: Diagnosis not present

## 2017-03-15 DIAGNOSIS — IMO0001 Reserved for inherently not codable concepts without codable children: Secondary | ICD-10-CM

## 2017-03-15 MED ORDER — INSULIN GLARGINE 100 UNIT/ML SOLOSTAR PEN: 30.0000 [IU] | PEN_INJECTOR | SUBCUTANEOUS | 1 refills | Status: DC

## 2017-03-15 NOTE — Progress Notes (Signed)
Subjective:    Patient ID: Jessica Pace, female    DOB: Mar 14, 1961, 56 y.o.   MRN: [id number]  HPI Pt returns for f/u of diabetes mellitus: DM type: Insulin-requiring type 2 DM.  Dx'ed: 2229 Complications: none Therapy: insulin since early 2018.  GDM: never DKA: never (but she had nonketotic hyperosmolar hyperglycemic state in 2018) Severe hypoglycemia: never Pancreatitis: never.   Other: she did not tolerate trulicity (diarrhea); she is on qd insulin, at least for now.   Interval history: no cbg record, but states cbg's vary from 88-188.  It is in general lowest in the afternoon. pt states she feels well in general.  Past Medical History:  Diagnosis Date  . Complication of anesthesia    "takes me a long time to come out" (09/05/2016)  . Hypertension   . Pneumonia 1990's X 2; 2013  . Type II diabetes mellitus ([place])     Past Surgical History:  Procedure Laterality Date  . ABDOMINAL HYSTERECTOMY  1998  . CARPAL TUNNEL RELEASE Right ~ 2012  . TUBAL LIGATION  1994    Social History   Social History  . Marital status: Married    Spouse name: N/A  . Number of children: 2  . Years of education: N/A   Occupational History  . Environmental Specialist    Social History Main Topics  . Smoking status: Never Smoker  . Smokeless tobacco: Never Used  . Alcohol use No  . Drug use: No  . Sexual activity: Not Currently    Birth control/ protection: Surgical   Other Topics Concern  . Not on file   Social History Narrative  . No narrative on file    Current Outpatient Prescriptions on File Prior to Visit  Medication Sig Dispense Refill  . Ascorbic Acid (VITAMIN C) 1000 MG tablet Take 1,000 mg by mouth daily.    Marland Kitchen atorvastatin (LIPITOR) 40 MG tablet Take 1 tablet (40 mg total) by mouth daily. 30 tablet 2  . Cod Liver Oil CAPS Take 1 capsule by mouth every morning.    Marland Kitchen GARLIC OIL PO Take 1 tablet by mouth daily.    . Glucose Blood (BLOOD GLUCOSE TEST STRIPS) STRP Test  twice a day. Pt uses one touch verio meter 100 each 5  . Insulin Pen Needle (PEN NEEDLES) 32G X 4 MM MISC 1 each by Other route daily. Use with lantus nightly 100 each 1  . losartan (COZAAR) 25 MG tablet Take 1 tablet (25 mg total) by mouth daily. 90 tablet 3  . metFORMIN (GLUCOPHAGE-XR) 500 MG 24 hr tablet Take 4 tablets (2,000 mg total) by mouth daily with breakfast. 120 tablet 11  . ONETOUCH DELICA LANCETS FINE MISC Test twice a daily. Pt uses a one touch verio meter 100 each 5  . polyethylene glycol (MIRALAX / GLYCOLAX) packet Take 17 g by mouth as needed for moderate constipation.      Current Facility-Administered Medications on File Prior to Visit  Medication Dose Route Frequency Provider Last Rate Last Dose  . 0.9 %  sodium chloride infusion  500 mL Intravenous Continuous Nandigam, Venia Minks, MD        Allergies  Allergen Reactions  . Butyl Acetate Other (See Comments)    In paints and nail polish- unknown    Family History  Problem Relation Age of Onset  . Diabetes Mother   . Hyperlipidemia Father   . Hypertension Father   . Heart disease Maternal Grandmother   . Colon  cancer Neg Hx   . Stomach cancer Neg Hx   . Rectal cancer Neg Hx   . Esophageal cancer Neg Hx   . Liver cancer Neg Hx     BP 126/84   Pulse 74   Ht 5\' 4"  (1.626 m)   Wt 208 lb (94.3 kg)   SpO2 96%   BMI 35.70 kg/m   Review of Systems She denies hypoglycemia.     Objective:   Physical Exam VITAL SIGNS:  See vs page GENERAL: no distress Pulses: dorsalis pedis intact bilat.   MSK: no deformity of the feet CV: no leg edema Skin:  no ulcer on the feet.  normal color and temp on the feet. Neuro: sensation is intact to touch on the feet.  Ext: There is bilateral onychomycosis of the toenails.       Assessment & Plan:  Insulin-requiring type 2 DM: glycemic control is improved.   Diarrhea: this limits rx options.   Patient Instructions  check your blood sugar twice a day.  vary the time of day  when you check, between before the 3 meals, and at bedtime.  also check if you have symptoms of your blood sugar being too high or too low.  please keep a record of the readings and bring it to your next appointment here (or you can bring the meter itself).  You can write it on any piece of paper.  please call us sooner if your blood sugar goes below 70, or if you have a lot of readings over 200.  Please continue the same medications for diabetes. On this type of insulin schedule, you should eat meals on a regular schedule.  If a meal is missed or significantly delayed, your blood sugar could go low.  Please come back for a follow-up appointment in 6 weeks.

## 2017-03-15 NOTE — Patient Instructions (Addendum)
check your blood sugar twice a day.  vary the time of day when you check, between before the 3 meals, and at bedtime.  also check if you have symptoms of your blood sugar being too high or too low.  please keep a record of the readings and bring it to your next appointment here (or you can bring the meter itself).  You can write it on any piece of paper.  please call us sooner if your blood sugar goes below 70, or if you have a lot of readings over 200.  Please continue the same medications for diabetes. On this type of insulin schedule, you should eat meals on a regular schedule.  If a meal is missed or significantly delayed, your blood sugar could go low.  Please come back for a follow-up appointment in 6 weeks.

## 2017-03-16 ENCOUNTER — Ambulatory Visit: Payer: Managed Care, Other (non HMO) | Admitting: Endocrinology

## 2017-04-26 ENCOUNTER — Encounter: Payer: Self-pay | Admitting: Endocrinology

## 2017-04-26 ENCOUNTER — Ambulatory Visit (INDEPENDENT_AMBULATORY_CARE_PROVIDER_SITE_OTHER): Payer: Managed Care, Other (non HMO) | Admitting: Endocrinology

## 2017-04-26 ENCOUNTER — Telehealth: Payer: Self-pay | Admitting: Family Medicine

## 2017-04-26 ENCOUNTER — Other Ambulatory Visit: Payer: Self-pay | Admitting: Medical

## 2017-04-26 VITALS — BP 132/88 | HR 71 | Ht 64.0 in | Wt 209.0 lb

## 2017-04-26 DIAGNOSIS — Z794 Long term (current) use of insulin: Secondary | ICD-10-CM | POA: Diagnosis not present

## 2017-04-26 DIAGNOSIS — E1165 Type 2 diabetes mellitus with hyperglycemia: Secondary | ICD-10-CM

## 2017-04-26 DIAGNOSIS — IMO0001 Reserved for inherently not codable concepts without codable children: Secondary | ICD-10-CM

## 2017-04-26 LAB — POCT GLYCOSYLATED HEMOGLOBIN (HGB A1C): HEMOGLOBIN A1C: 8.1

## 2017-04-26 MED ORDER — INSULIN GLARGINE 100 UNIT/ML SOLOSTAR PEN: 40.0000 [IU] | PEN_INJECTOR | SUBCUTANEOUS | 1 refills | Status: DC

## 2017-04-26 MED ORDER — LOSARTAN POTASSIUM 25 MG PO TABS: 25.0000 mg | ORAL_TABLET | Freq: Every day | ORAL | 1 refills | Status: DC

## 2017-04-26 NOTE — Progress Notes (Signed)
Subjective:    Patient ID: Jessica Pace, female    DOB: 09-02-1961, 56 y.o.   MRN: [id number]  HPI Pt returns for f/u of diabetes mellitus: DM type: Insulin-requiring type 2 DM.  Dx'ed: 4401 Complications: none Therapy: insulin since early 2018, and metformin.  GDM: never DKA: never (but she had nonketotic hyperosmolar hyperglycemic state in 2018).  Severe hypoglycemia: never Pancreatitis: never.   Other: she did not tolerate trulicity (diarrhea); she declines multiple daily injections.   Interval history: no cbg record, but states cbg's vary from 90-200.  It is still in general lowest in the afternoon. pt states she feels well in general.  Pt says she never misses the insulin.  Past Medical History:  Diagnosis Date  . Complication of anesthesia    "takes me a long time to come out" (09/05/2016)  . Hypertension   . Pneumonia 1990's X 2; 2013  . Type II diabetes mellitus ([place])     Past Surgical History:  Procedure Laterality Date  . ABDOMINAL HYSTERECTOMY  1998  . CARPAL TUNNEL RELEASE Right ~ 2012  . TUBAL LIGATION  1994    Social History   Social History  . Marital status: Married    Spouse name: N/A  . Number of children: 2  . Years of education: N/A   Occupational History  . Environmental Specialist    Social History Main Topics  . Smoking status: Never Smoker  . Smokeless tobacco: Never Used  . Alcohol use No  . Drug use: No  . Sexual activity: Not Currently    Birth control/ protection: Surgical   Other Topics Concern  . Not on file   Social History Narrative  . No narrative on file    Current Outpatient Prescriptions on File Prior to Visit  Medication Sig Dispense Refill  . Ascorbic Acid (VITAMIN C) 1000 MG tablet Take 1,000 mg by mouth daily.    Marland Kitchen atorvastatin (LIPITOR) 40 MG tablet Take 1 tablet (40 mg total) by mouth daily. 30 tablet 2  . Cod Liver Oil CAPS Take 1 capsule by mouth every morning.    Marland Kitchen GARLIC OIL PO Take 1 tablet by mouth  daily.    . Glucose Blood (BLOOD GLUCOSE TEST STRIPS) STRP Test twice a day. Pt uses one touch verio meter 100 each 5  . Insulin Pen Needle (PEN NEEDLES) 32G X 4 MM MISC 1 each by Other route daily. Use with lantus nightly 100 each 1  . metFORMIN (GLUCOPHAGE-XR) 500 MG 24 hr tablet Take 4 tablets (2,000 mg total) by mouth daily with breakfast. 120 tablet 11  . ONETOUCH DELICA LANCETS FINE MISC Test twice a daily. Pt uses a one touch verio meter 100 each 5  . polyethylene glycol (MIRALAX / GLYCOLAX) packet Take 17 g by mouth as needed for moderate constipation.      Current Facility-Administered Medications on File Prior to Visit  Medication Dose Route Frequency Provider Last Rate Last Dose  . 0.9 %  sodium chloride infusion  500 mL Intravenous Continuous Nandigam, Venia Minks, MD        Allergies  Allergen Reactions  . Butyl Acetate Other (See Comments)    In paints and nail polish- unknown    Family History  Problem Relation Age of Onset  . Diabetes Mother   . Hyperlipidemia Father   . Hypertension Father   . Heart disease Maternal Grandmother   . Colon cancer Neg Hx   . Stomach cancer Neg Hx   .  Rectal cancer Neg Hx   . Esophageal cancer Neg Hx   . Liver cancer Neg Hx     BP 132/88   Pulse 71   Ht 5\' 4"  (1.626 m)   Wt 209 lb (94.8 kg)   SpO2 96%   BMI 35.87 kg/m    Review of Systems She denies hypoglycemia.      Objective:   Physical Exam VITAL SIGNS:  See vs page GENERAL: no distress Pulses: dorsalis pedis intact bilat.   MSK: no deformity of the feet CV: trace bilat leg edema Skin:  no ulcer on the feet.  normal color and temp on the feet. Neuro: sensation is intact to touch on the feet.  Ext: There is bilateral onychomycosis of the toenails.    A1c=8.1%    Assessment & Plan:  Insulin-requiring type 2 DM: she needs increased rx   Patient Instructions  check your blood sugar twice a day.  vary the time of day when you check, between before the 3 meals,  and at bedtime.  also check if you have symptoms of your blood sugar being too high or too low.  please keep a record of the readings and bring it to your next appointment here (or you can bring the meter itself).  You can write it on any piece of paper.  please call us sooner if your blood sugar goes below 70, or if you have a lot of readings over 200.  Please continue the same metformin, and: Increase the insulin to 40 units each morning.  On this type of insulin schedule, you should eat meals on a regular schedule.  If a meal is missed or significantly delayed, your blood sugar could go low.  Please come back for a follow-up appointment in 2 months.

## 2017-04-26 NOTE — Telephone Encounter (Signed)
Ok to refill 

## 2017-04-26 NOTE — Telephone Encounter (Signed)
done

## 2017-04-26 NOTE — Telephone Encounter (Signed)
Pt come by requesting a refill for her losartan pt is leaving out of town tommorow and would like this today pt uses [facility] [address], [place]

## 2017-04-26 NOTE — Patient Instructions (Addendum)
check your blood sugar twice a day.  vary the time of day when you check, between before the 3 meals, and at bedtime.  also check if you have symptoms of your blood sugar being too high or too low.  please keep a record of the readings and bring it to your next appointment here (or you can bring the meter itself).  You can write it on any piece of paper.  please call us sooner if your blood sugar goes below 70, or if you have a lot of readings over 200.  Please continue the same metformin, and: Increase the insulin to 40 units each morning.  On this type of insulin schedule, you should eat meals on a regular schedule.  If a meal is missed or significantly delayed, your blood sugar could go low.  Please come back for a follow-up appointment in 2 months.

## 2017-05-03 ENCOUNTER — Encounter: Payer: Managed Care, Other (non HMO) | Admitting: Nutrition

## 2017-06-30 ENCOUNTER — Ambulatory Visit: Payer: Managed Care, Other (non HMO) | Admitting: Endocrinology

## 2017-07-18 ENCOUNTER — Telehealth: Payer: Self-pay | Admitting: Family Medicine

## 2017-07-18 NOTE — Telephone Encounter (Signed)
[facility]d [facility] requesting an alternate med to replace Lantus Solostar 100 units. Suggested alternatives includes Levemir Flextouch 100units, Basaglar 100 units Kwikpen, Levemir 100 units vial, Tresiba Flextouch 100 units

## 2017-07-19 NOTE — Telephone Encounter (Signed)
Pt sees endocrinology, please send back to pharmacy that this does not get filled by [place]

## 2017-07-19 NOTE — Telephone Encounter (Signed)
I believe this came to me in error.  This is not a patient at our office.

## 2017-07-19 NOTE — Telephone Encounter (Signed)
This one came to me in error

## 2017-07-19 NOTE — Telephone Encounter (Signed)
She is seeing Dr. Loanne Drilling for her diabetes. Please forward this.

## 2017-07-26 DIAGNOSIS — Z7689 Persons encountering health services in other specified circumstances: Secondary | ICD-10-CM | POA: Diagnosis not present

## 2017-08-30 DIAGNOSIS — J019 Acute sinusitis, unspecified: Secondary | ICD-10-CM | POA: Diagnosis not present

## 2017-08-30 DIAGNOSIS — B9689 Other specified bacterial agents as the cause of diseases classified elsewhere: Secondary | ICD-10-CM | POA: Diagnosis not present

## 2017-10-13 ENCOUNTER — Other Ambulatory Visit: Payer: Self-pay | Admitting: Family Medicine

## 2017-10-13 NOTE — Telephone Encounter (Signed)
ok 

## 2017-10-13 NOTE — Telephone Encounter (Signed)
Last seen 01/27/17, okay to refill? Thanks!

## 2017-11-06 ENCOUNTER — Other Ambulatory Visit: Payer: Self-pay

## 2017-11-09 ENCOUNTER — Other Ambulatory Visit: Payer: Self-pay | Admitting: Family Medicine

## 2017-11-15 ENCOUNTER — Telehealth: Payer: Self-pay | Admitting: Internal Medicine

## 2017-11-15 NOTE — Telephone Encounter (Signed)
Called and spoke to patient about her recall on losartan. She states that her med is recalled. Please send [facility] another [facility]

## 2017-11-16 ENCOUNTER — Other Ambulatory Visit: Payer: Self-pay | Admitting: Family Medicine

## 2017-11-16 DIAGNOSIS — I10 Essential (primary) hypertension: Secondary | ICD-10-CM

## 2017-11-16 MED ORDER — OLMESARTAN MEDOXOMIL 20 MG PO TABS: 20.0000 mg | ORAL_TABLET | Freq: Every day | ORAL | 2 refills | Status: DC

## 2017-11-16 NOTE — Telephone Encounter (Signed)
Pt was notified.  

## 2017-11-16 NOTE — Progress Notes (Signed)
   Subjective:    Patient ID: Jessica Pace, female    DOB: [birth year], 59 y.o.   MRN: [id number]  HPI    Review of Systems     Objective:   Physical Exam        Assessment & Plan:

## 2017-11-16 NOTE — Telephone Encounter (Signed)
Please let her know that I sent in a replacement medication called olmesartan (brand name Benicar) to her pharmacy. Stop the losartan and start the new medication. Thanks.

## 2017-12-04 ENCOUNTER — Ambulatory Visit: Payer: 59 | Admitting: Family Medicine

## 2017-12-04 ENCOUNTER — Encounter: Payer: Self-pay | Admitting: Family Medicine

## 2017-12-04 ENCOUNTER — Telehealth: Payer: Self-pay | Admitting: Family Medicine

## 2017-12-04 VITALS — BP 120/80 | HR 74 | Wt 208.2 lb

## 2017-12-04 DIAGNOSIS — R945 Abnormal results of liver function studies: Secondary | ICD-10-CM

## 2017-12-04 DIAGNOSIS — I10 Essential (primary) hypertension: Secondary | ICD-10-CM | POA: Diagnosis not present

## 2017-12-04 DIAGNOSIS — Z79899 Other long term (current) drug therapy: Secondary | ICD-10-CM | POA: Diagnosis not present

## 2017-12-04 DIAGNOSIS — R232 Flushing: Secondary | ICD-10-CM | POA: Diagnosis not present

## 2017-12-04 DIAGNOSIS — E1165 Type 2 diabetes mellitus with hyperglycemia: Secondary | ICD-10-CM | POA: Diagnosis not present

## 2017-12-04 DIAGNOSIS — Z8639 Personal history of other endocrine, nutritional and metabolic disease: Secondary | ICD-10-CM | POA: Diagnosis not present

## 2017-12-04 DIAGNOSIS — R7989 Other specified abnormal findings of blood chemistry: Secondary | ICD-10-CM

## 2017-12-04 LAB — POCT URINALYSIS DIP (PROADVANTAGE DEVICE)
Bilirubin, UA: NEGATIVE
Glucose, UA: NEGATIVE mg/dL
Ketones, POC UA: NEGATIVE mg/dL
LEUKOCYTES UA: NEGATIVE
NITRITE UA: NEGATIVE
PH UA: 6 (ref 5.0–8.0)
Protein Ur, POC: NEGATIVE mg/dL
RBC UA: NEGATIVE
Specific Gravity, Urine: 1.03
UUROB: NEGATIVE

## 2017-12-04 LAB — GLUCOSE, POCT (MANUAL RESULT ENTRY): POC GLUCOSE: 204 mg/dL — AB (ref 70–99)

## 2017-12-04 LAB — POCT GLYCOSYLATED HEMOGLOBIN (HGB A1C)

## 2017-12-04 MED ORDER — PEN NEEDLES 32G X 4 MM MISC: 1.0000 | Freq: Every day | 0 refills | Status: DC

## 2017-12-04 MED ORDER — BASAGLAR KWIKPEN 100 UNIT/ML ~~LOC~~ SOPN: 40.0000 [IU] | PEN_INJECTOR | Freq: Every day | SUBCUTANEOUS | 0 refills | Status: DC

## 2017-12-04 MED ORDER — INSULIN GLARGINE 100 UNIT/ML SOLOSTAR PEN: 40.0000 [IU] | PEN_INJECTOR | SUBCUTANEOUS | 0 refills | Status: DC

## 2017-12-04 MED ORDER — METFORMIN HCL ER 500 MG PO TB24: 2000.0000 mg | ORAL_TABLET | Freq: Every day | ORAL | 0 refills | Status: DC

## 2017-12-04 NOTE — Telephone Encounter (Signed)
[facility] Basaglar instead of Lantus Solostar

## 2017-12-04 NOTE — Progress Notes (Signed)
   Subjective:    Patient ID: Jessica Pace, female    DOB: 1961/10/07, 57 y.o.   MRN: [id number]  HPI Chief Complaint  Patient presents with  . sugars running high    sugars running high. leg cramps at night, night sweats   She is here to discuss hyperglycemia and other issues. Dr. Loanne Drilling has been managing her diabetes and her last Hgb A1c was 8.1% in July 2018. She has not followed up. No particular reason given for not following up with endocrinology and she plans to do this.  States she has been checking her blood sugars 3 times daily and her FBS have been in the 300s.  States she is taking Metformin daily but reports she has been out of Lantus for the past 2 days.   She had GI side effects from Trulicity so this was stopped.   States she has had personal issues and had to separate from her husband and get him in rehab. This has been especially stressful for her.   Complains of having intermittent hot flashes at various times and last week she had leg cramps at night. Leg cramps have improved but hot flashes persist. She has not tried anything OTC for this.  Hysterectomy in 1998 for fibroids but states ovaries were left.   Reports having diabetic eye exam and no retinopathy Taking statin 5 days per week. She is fasting.   History of elevated LFTs. Denies drinking alcohol.   Denies fever, chills, dizziness, chest pain, palpitations, shortness of breath, abdominal pain, N/V/D, urinary symptoms.   Reviewed allergies, medications, past medical, surgical, family, and social history.   Review of Systems Pertinent positives and negatives in the history of present illness.     Objective:   Physical Exam BP 120/80   Pulse 74   Wt 208 lb 3.2 oz (94.4 kg)   BMI 35.74 kg/m   Alert and in no distress. Pharyngeal area is normal. Neck is supple without adenopathy or thyromegaly. Cardiac exam shows a regular sinus rhythm without murmurs or gallops. Lungs are clear to auscultation.  Extremities without edema.        Assessment & Plan:  Uncontrolled type 2 diabetes mellitus with hyperglycemia (HCC) - Plan: CBC with Differential/Platelet, Comprehensive metabolic panel, POCT Urinalysis DIP (Proadvantage Device), TSH, Lipid panel, POCT glycosylated hemoglobin (Hb A1C), Glucose (CBG), Microalbumin/Creatinine Ratio, Urine  History of hypothyroidism - Plan: TSH  Essential hypertension - Plan: CBC with Differential/Platelet, Comprehensive metabolic panel, Lipid panel  Elevated LFTs - Plan: Comprehensive metabolic panel  Medication management - Plan: Lipid panel  Hot flashes  Hgb A1c 9.2% FBS in office 204   Refilled Lantus and encouraged her to start back on this. Continue taking Metformin. Refill given  for 30 days to give her to time to schedule with Dr. Loanne Drilling who has been managing her diabetes.  BP is within goal range. Continue on medication.  Counseled on healthy diet and exercise.  Hot flashes- labs done. She may try OTC such as black kohosh and avoid triggers. Discussed the possibility of an SSRI and we will hold off for now.  Will recheck LFTs, have been elevated in the past. She denies alcohol use.  Follow up pending labs.

## 2017-12-04 NOTE — Telephone Encounter (Signed)
Please advise 

## 2017-12-04 NOTE — Telephone Encounter (Signed)
Ok to substitute and it will be the same dosing as her Lantus.

## 2017-12-04 NOTE — Patient Instructions (Signed)
Call and schedule a visit with Dr. Loanne Drilling. Your hemoglobin A1c is 9.2% and is uncontrolled.   We are refilling your Lantus and Metformin for 30 days to give you time to see your endocrinologist.   Try [facility] for hot flashes and avoid any triggers that make these worse.   Let me know if the dry cough continues in spite of taking a week off from the olmesartan. This could be related to reflux or allergies or some other reason.   We will call you with your lab results.

## 2017-12-04 NOTE — Telephone Encounter (Signed)
Done

## 2017-12-05 LAB — CBC WITH DIFFERENTIAL/PLATELET
BASOS ABS: 0 10*3/uL (ref 0.0–0.2)
BASOS: 1 %
EOS (ABSOLUTE): 0.1 10*3/uL (ref 0.0–0.4)
Eos: 3 %
Hematocrit: 39.4 % (ref 34.0–46.6)
Hemoglobin: 12.8 g/dL (ref 11.1–15.9)
IMMATURE GRANS (ABS): 0 10*3/uL (ref 0.0–0.1)
IMMATURE GRANULOCYTES: 0 %
LYMPHS: 43 %
Lymphocytes Absolute: 2 10*3/uL (ref 0.7–3.1)
MCH: 28.1 pg (ref 26.6–33.0)
MCHC: 32.5 g/dL (ref 31.5–35.7)
MCV: 86 fL (ref 79–97)
MONOS ABS: 0.2 10*3/uL (ref 0.1–0.9)
Monocytes: 5 %
NEUTROS PCT: 48 %
Neutrophils Absolute: 2.3 10*3/uL (ref 1.4–7.0)
PLATELETS: 209 10*3/uL (ref 150–379)
RBC: 4.56 x10E6/uL (ref 3.77–5.28)
RDW: 15.2 % (ref 12.3–15.4)
WBC: 4.7 10*3/uL (ref 3.4–10.8)

## 2017-12-05 LAB — COMPREHENSIVE METABOLIC PANEL
ALT: 40 IU/L — AB (ref 0–32)
AST: 21 IU/L (ref 0–40)
Albumin/Globulin Ratio: 1.5 (ref 1.2–2.2)
Albumin: 4.6 g/dL (ref 3.5–5.5)
Alkaline Phosphatase: 78 IU/L (ref 39–117)
BILIRUBIN TOTAL: 0.4 mg/dL (ref 0.0–1.2)
BUN/Creatinine Ratio: 16 (ref 9–23)
BUN: 13 mg/dL (ref 6–24)
CALCIUM: 9.7 mg/dL (ref 8.7–10.2)
CHLORIDE: 101 mmol/L (ref 96–106)
CO2: 23 mmol/L (ref 20–29)
Creatinine, Ser: 0.81 mg/dL (ref 0.57–1.00)
GFR calc Af Amer: 93 mL/min/{1.73_m2} (ref >59)
GFR, EST NON AFRICAN AMERICAN: 80 mL/min/{1.73_m2} (ref >59)
GLUCOSE: 197 mg/dL — AB (ref 65–99)
Globulin, Total: 3 g/dL (ref 1.5–4.5)
POTASSIUM: 4.7 mmol/L (ref 3.5–5.2)
Sodium: 138 mmol/L (ref 134–144)
TOTAL PROTEIN: 7.6 g/dL (ref 6.0–8.5)

## 2017-12-05 LAB — MICROALBUMIN / CREATININE URINE RATIO
CREATININE, UR: 193 mg/dL
MICROALBUM., U, RANDOM: 22.6 ug/mL
Microalb/Creat Ratio: 11.7 mg/g creat (ref 0.0–30.0)

## 2017-12-05 LAB — LIPID PANEL
CHOLESTEROL TOTAL: 126 mg/dL (ref 100–199)
Chol/HDL Ratio: 3.9 ratio (ref 0.0–4.4)
HDL: 32 mg/dL — ABNORMAL LOW (ref >39)
LDL Calculated: 67 mg/dL (ref 0–99)
TRIGLYCERIDES: 136 mg/dL (ref 0–149)
VLDL Cholesterol Cal: 27 mg/dL (ref 5–40)

## 2017-12-05 LAB — TSH: TSH: 3.77 u[IU]/mL (ref 0.450–4.500)

## 2017-12-06 LAB — HEPATITIS PANEL, ACUTE
HEP A IGM: NEGATIVE
HEP B C IGM: NEGATIVE
Hep C Virus Ab: 0.1 s/co ratio (ref 0.0–0.9)
Hepatitis B Surface Ag: NEGATIVE

## 2017-12-06 LAB — SPECIMEN STATUS REPORT

## 2017-12-07 NOTE — Telephone Encounter (Signed)
dt ?

## 2017-12-31 ENCOUNTER — Other Ambulatory Visit: Payer: Self-pay | Admitting: Family Medicine

## 2018-01-01 NOTE — Telephone Encounter (Signed)
When pt was seen back in febrauary for her DM cause she had not followed-up with Dr. Loanne Drilling, Vickie advised pt that we would refill med then for 30 days and she needed to schedule with Dr. Loanne Drilling. Denying med

## 2018-01-03 ENCOUNTER — Other Ambulatory Visit: Payer: Self-pay | Admitting: Endocrinology

## 2018-01-03 NOTE — Telephone Encounter (Signed)
metFORMIN (GLUCOPHAGE-XR) 500 MG 24 hr tablet   Patient needs prescription sent in for the following.   [id number] [place] [place] [facility] [address]

## 2018-01-03 NOTE — Telephone Encounter (Signed)
See message and please advise if ok to refill. Patient has not been seen since 2018 and medication is listed under a different provider.

## 2018-01-04 ENCOUNTER — Other Ambulatory Visit: Payer: Self-pay | Admitting: Family Medicine

## 2018-01-04 NOTE — Telephone Encounter (Signed)
She was already advised to get this from her ENDOCRINOLOGY.

## 2018-01-06 NOTE — Telephone Encounter (Signed)
Please refill x 1 Ov is due  

## 2018-01-08 MED ORDER — METFORMIN HCL ER 500 MG PO TB24: 2000.0000 mg | ORAL_TABLET | Freq: Every day | ORAL | 0 refills | Status: DC

## 2018-01-14 ENCOUNTER — Other Ambulatory Visit: Payer: Self-pay | Admitting: Family Medicine

## 2018-01-15 NOTE — Telephone Encounter (Signed)
Pt was told when she was here to follow-up with Dr. Loanne Drilling her Endocrinology

## 2018-01-17 ENCOUNTER — Ambulatory Visit: Payer: 59 | Admitting: Endocrinology

## 2018-01-22 ENCOUNTER — Encounter: Payer: Self-pay | Admitting: Endocrinology

## 2018-01-22 ENCOUNTER — Ambulatory Visit: Payer: 59 | Admitting: Endocrinology

## 2018-01-22 VITALS — BP 128/82 | HR 75 | Wt 211.8 lb

## 2018-01-22 DIAGNOSIS — E1165 Type 2 diabetes mellitus with hyperglycemia: Secondary | ICD-10-CM | POA: Diagnosis not present

## 2018-01-22 MED ORDER — INSULIN DEGLUDEC 100 UNIT/ML ~~LOC~~ SOPN: 40.0000 [IU] | PEN_INJECTOR | Freq: Every day | SUBCUTANEOUS | 11 refills | Status: DC

## 2018-01-22 NOTE — Progress Notes (Signed)
Subjective:    Patient ID: Jessica Pace, female    DOB: May 23, 1961, 56 y.o.   MRN: [id number]  HPI Pt returns for f/u of diabetes mellitus: DM type: Insulin-requiring type 2 DM.  Dx'ed: 2671 Complications: none Therapy: insulin since early 2018, and metformin.  GDM: never DKA: never (but she had nonketotic hyperosmolar hyperglycemic state in 2018).  Severe hypoglycemia: never Pancreatitis: never.   Other: she did not tolerate trulicity (diarrhea); she declines multiple daily injections.   Interval history: no cbg record, but states cbg's vary from 99-300's.  pt states she feels well in general.  Pt says she misses the insulin 1-2 times per week.   Past Medical History:  Diagnosis Date  . Complication of anesthesia    "takes me a long time to come out" (09/05/2016)  . Hypertension   . Pneumonia 1990's X 2; 2013  . Type II diabetes mellitus ([place])     Past Surgical History:  Procedure Laterality Date  . ABDOMINAL HYSTERECTOMY  1998  . CARPAL TUNNEL RELEASE Right ~ 2012  . TUBAL LIGATION  1994    Social History   Socioeconomic History  . Marital status: Married    Spouse name: Not on file  . Number of children: 2  . Years of education: Not on file  . Highest education level: Not on file  Occupational History  . Occupation: Visual merchandiser  Social Needs  . Financial resource strain: Not on file  . Food insecurity:    Worry: Not on file    Inability: Not on file  . Transportation needs:    Medical: Not on file    Non-medical: Not on file  Tobacco Use  . Smoking status: Never Smoker  . Smokeless tobacco: Never Used  Substance and Sexual Activity  . Alcohol use: No  . Drug use: No  . Sexual activity: Not Currently    Birth control/protection: Surgical  Lifestyle  . Physical activity:    Days per week: Not on file    Minutes per session: Not on file  . Stress: Not on file  Relationships  . Social connections:    Talks on phone: Not on file    Gets  together: Not on file    Attends religious service: Not on file    Active member of club or organization: Not on file    Attends meetings of clubs or organizations: Not on file    Relationship status: Not on file  . Intimate partner violence:    Fear of current or ex partner: Not on file    Emotionally abused: Not on file    Physically abused: Not on file    Forced sexual activity: Not on file  Other Topics Concern  . Not on file  Social History Narrative  . Not on file    Current Outpatient Medications on File Prior to Visit  Medication Sig Dispense Refill  . Ascorbic Acid (VITAMIN C) 1000 MG tablet Take 1,000 mg by mouth daily.    Marland Kitchen atorvastatin (LIPITOR) 40 MG tablet Take 1 tablet (40 mg total) by mouth daily. 30 tablet 2  . Cod Liver Oil CAPS Take 1 capsule by mouth every morning.    Marland Kitchen GARLIC OIL PO Take 1 tablet by mouth daily.    . Glucose Blood (BLOOD GLUCOSE TEST STRIPS) STRP Test twice a day. Pt uses one touch verio meter 100 each 5  . Insulin Pen Needle (PEN NEEDLES) 32G X 4 MM MISC 1 each  by Other route daily. Use with Basalgar 30 each 0  . olmesartan (BENICAR) 20 MG tablet Take 1 tablet (20 mg total) by mouth daily. 30 tablet 2  . ONETOUCH DELICA LANCETS FINE MISC Test twice a daily. Pt uses a one touch verio meter 100 each 5   Current Facility-Administered Medications on File Prior to Visit  Medication Dose Route Frequency Provider Last Rate Last Dose  . 0.9 %  sodium chloride infusion  500 mL Intravenous Continuous Nandigam, Venia Minks, MD        Allergies  Allergen Reactions  . Butyl Acetate Other (See Comments)    In paints and nail polish- unknown    Family History  Problem Relation Age of Onset  . Diabetes Mother   . Hyperlipidemia Father   . Hypertension Father   . Heart disease Maternal Grandmother   . Colon cancer Neg Hx   . Stomach cancer Neg Hx   . Rectal cancer Neg Hx   . Esophageal cancer Neg Hx   . Liver cancer Neg Hx     BP 128/82 (BP  Location: Left Arm, Patient Position: Sitting, Cuff Size: Normal)   Pulse 75   Wt 211 lb 12.8 oz (96.1 kg)   SpO2 95%   BMI 36.36 kg/m    Review of Systems She denies hypoglycemia. She has diarrhea, which she feels is due to metformin-XR.      Objective:   Physical Exam VITAL SIGNS:  See vs page GENERAL: no distress Pulses: dorsalis pedis intact bilat.   MSK: no deformity of the feet CV: no leg edema Skin:  no ulcer on the feet.  normal color and temp on the feet. Neuro: sensation is intact to touch on the feet.  Ext: There is bilateral onychomycosis of the toenails.        Assessment & Plan:  Insulin-requiring type 2 DM: poor glycemic control intermitt med taking: we'll change to tresiba, for flexible dosing Diarrhea, apparently due to metformin  Patient Instructions  check your blood sugar twice a day.  vary the time of day when you check, between before the 3 meals, and at bedtime.  also check if you have symptoms of your blood sugar being too high or too low.  please keep a record of the readings and bring it to your next appointment here (or you can bring the meter itself).  You can write it on any piece of paper.  please call us sooner if your blood sugar goes below 70, or if you have a lot of readings over 200.  Please stop taking the metformin, and:  Change the basaglar to "Tresiba," 40 units daily.  This is a very slow time-release insulin, so if you miss it, you can make it up as soon as you remember.  On this type of insulin schedule, you should eat meals on a regular schedule.  If a meal is missed or significantly delayed, your blood sugar could go low.  Please come back for a follow-up appointment in 3 months.

## 2018-01-22 NOTE — Patient Instructions (Addendum)
check your blood sugar twice a day.  vary the time of day when you check, between before the 3 meals, and at bedtime.  also check if you have symptoms of your blood sugar being too high or too low.  please keep a record of the readings and bring it to your next appointment here (or you can bring the meter itself).  You can write it on any piece of paper.  please call us sooner if your blood sugar goes below 70, or if you have a lot of readings over 200.  Please stop taking the metformin, and:  Change the basaglar to "Tresiba," 40 units daily.  This is a very slow time-release insulin, so if you miss it, you can make it up as soon as you remember.  On this type of insulin schedule, you should eat meals on a regular schedule.  If a meal is missed or significantly delayed, your blood sugar could go low.  Please come back for a follow-up appointment in 3 months.

## 2018-01-30 ENCOUNTER — Other Ambulatory Visit: Payer: Self-pay | Admitting: *Deleted

## 2018-01-30 ENCOUNTER — Other Ambulatory Visit: Payer: Self-pay | Admitting: Endocrinology

## 2018-01-30 MED ORDER — METFORMIN HCL ER 500 MG PO TB24: 2000.0000 mg | ORAL_TABLET | Freq: Every day | ORAL | 0 refills | Status: DC

## 2018-01-30 NOTE — Telephone Encounter (Signed)
rec fax requesting 90 day supply of Metformin ER 500 mg. New Rx sent. See meds.

## 2018-01-31 ENCOUNTER — Other Ambulatory Visit: Payer: Self-pay | Admitting: Family Medicine

## 2018-01-31 DIAGNOSIS — I10 Essential (primary) hypertension: Secondary | ICD-10-CM

## 2018-04-30 ENCOUNTER — Ambulatory Visit: Payer: 59 | Admitting: Endocrinology

## 2018-04-30 ENCOUNTER — Encounter: Payer: Self-pay | Admitting: Endocrinology

## 2018-04-30 VITALS — BP 142/76 | HR 76 | Temp 98.2°F | Ht 64.0 in | Wt 217.4 lb

## 2018-04-30 DIAGNOSIS — E1165 Type 2 diabetes mellitus with hyperglycemia: Secondary | ICD-10-CM

## 2018-04-30 LAB — POCT GLYCOSYLATED HEMOGLOBIN (HGB A1C): Hemoglobin A1C: 8.8 % — AB (ref 4.0–5.6)

## 2018-04-30 MED ORDER — INSULIN DEGLUDEC 100 UNIT/ML ~~LOC~~ SOPN: 50.0000 [IU] | PEN_INJECTOR | Freq: Every day | SUBCUTANEOUS | 11 refills | Status: DC

## 2018-04-30 NOTE — Progress Notes (Signed)
Subjective:    Patient ID: Jessica Pace, female    DOB: 05/23/1961, 57 y.o.   MRN: [id number]  HPI Pt returns for f/u of diabetes mellitus: DM type: Insulin-requiring type 2 DM.  Dx'ed: 7628 Complications: none Therapy: insulin since early 2018, and metformin.  GDM: never DKA: never (but she had nonketotic hyperosmolar hyperglycemic state in 2018).  Severe hypoglycemia: never Pancreatitis: never.   Other: she did not tolerate trulicity (diarrhea); she declines multiple daily injections.   Interval history: no cbg record, but states cbg's vary from 130-300's.  pt states she feels well in general.  Pt says she never misses the insulin.     Past Medical History:  Diagnosis Date  . Complication of anesthesia    "takes me a long time to come out" (09/05/2016)  . Hypertension   . Pneumonia 1990's X 2; 2013  . Type II diabetes mellitus ([place])     Past Surgical History:  Procedure Laterality Date  . ABDOMINAL HYSTERECTOMY  1998  . CARPAL TUNNEL RELEASE Right ~ 2012  . TUBAL LIGATION  1994    Social History   Socioeconomic History  . Marital status: Married    Spouse name: Not on file  . Number of children: 2  . Years of education: Not on file  . Highest education level: Not on file  Occupational History  . Occupation: Visual merchandiser  Social Needs  . Financial resource strain: Not on file  . Food insecurity:    Worry: Not on file    Inability: Not on file  . Transportation needs:    Medical: Not on file    Non-medical: Not on file  Tobacco Use  . Smoking status: Never Smoker  . Smokeless tobacco: Never Used  Substance and Sexual Activity  . Alcohol use: No  . Drug use: No  . Sexual activity: Not Currently    Birth control/protection: Surgical  Lifestyle  . Physical activity:    Days per week: Not on file    Minutes per session: Not on file  . Stress: Not on file  Relationships  . Social connections:    Talks on phone: Not on file    Gets together:  Not on file    Attends religious service: Not on file    Active member of club or organization: Not on file    Attends meetings of clubs or organizations: Not on file    Relationship status: Not on file  . Intimate partner violence:    Fear of current or ex partner: Not on file    Emotionally abused: Not on file    Physically abused: Not on file    Forced sexual activity: Not on file  Other Topics Concern  . Not on file  Social History Narrative  . Not on file    Current Outpatient Medications on File Prior to Visit  Medication Sig Dispense Refill  . Ascorbic Acid (VITAMIN C) 1000 MG tablet Take 1,000 mg by mouth daily.    Marland Kitchen atorvastatin (LIPITOR) 40 MG tablet Take 1 tablet (40 mg total) by mouth daily. 30 tablet 2  . Cod Liver Oil CAPS Take 1 capsule by mouth every morning.    Marland Kitchen GARLIC OIL PO Take 1 tablet by mouth daily.    . Glucose Blood (BLOOD GLUCOSE TEST STRIPS) STRP Test twice a day. Pt uses one touch verio meter 100 each 5  . Insulin Pen Needle (PEN NEEDLES) 32G X 4 MM MISC 1 each by  Other route daily. Use with Basalgar 30 each 0  . olmesartan (BENICAR) 20 MG tablet TAKE 1 TABLET BY MOUTH EVERY DAY 30 tablet 4  . ONETOUCH DELICA LANCETS FINE MISC Test twice a daily. Pt uses a one touch verio meter 100 each 5   No current facility-administered medications on file prior to visit.     Allergies  Allergen Reactions  . Butyl Acetate Other (See Comments)    In paints and nail polish- unknown    Family History  Problem Relation Age of Onset  . Diabetes Mother   . Hyperlipidemia Father   . Hypertension Father   . Heart disease Maternal Grandmother   . Colon cancer Neg Hx   . Stomach cancer Neg Hx   . Rectal cancer Neg Hx   . Esophageal cancer Neg Hx   . Liver cancer Neg Hx     BP (!) 142/76 (BP Location: Right Arm, Patient Position: Sitting, Cuff Size: Normal)   Pulse 76   Temp 98.2 F (36.8 C) (Oral)   Ht 5\' 4"  (1.626 m)   Wt 217 lb 6.4 oz (98.6 kg)   SpO2 98%    BMI 37.32 kg/m    Review of Systems She denies hypoglycemia.     Objective:   Physical Exam VITAL SIGNS:  See vs page GENERAL: no distress Pulses: foot pulses are intact bilaterally.   MSK: no deformity of the feet or ankles.  CV: no edema of the legs or ankles Skin:  no ulcer on the feet or ankles.  normal color and temp on the feet and ankles Neuro: sensation is intact to touch on the feet and ankles.     Lab Results  Component Value Date   HGBA1C 8.8 (A) 04/30/2018      Assessment & Plan:  Insulin-requiring type 2 DM: she needs increased rx   Patient Instructions  check your blood sugar twice a day.  vary the time of day when you check, between before the 3 meals, and at bedtime.  also check if you have symptoms of your blood sugar being too high or too low.  please keep a record of the readings and bring it to your next appointment here (or you can bring the meter itself).  You can write it on any piece of paper.  please call us sooner if your blood sugar goes below 70, or if you have a lot of readings over 200.  Please increase the Tresiba to 50 units daily.  This is a very slow time-release insulin, so if you miss it, you can make it up as soon as you remember.  On this type of insulin schedule, you should eat meals on a regular schedule.  If a meal is missed or significantly delayed, your blood sugar could go low.  Please come back for a follow-up appointment in 2 months.

## 2018-04-30 NOTE — Patient Instructions (Addendum)
check your blood sugar twice a day.  vary the time of day when you check, between before the 3 meals, and at bedtime.  also check if you have symptoms of your blood sugar being too high or too low.  please keep a record of the readings and bring it to your next appointment here (or you can bring the meter itself).  You can write it on any piece of paper.  please call us sooner if your blood sugar goes below 70, or if you have a lot of readings over 200.  Please increase the Tresiba to 50 units daily.  This is a very slow time-release insulin, so if you miss it, you can make it up as soon as you remember.  On this type of insulin schedule, you should eat meals on a regular schedule.  If a meal is missed or significantly delayed, your blood sugar could go low.  Please come back for a follow-up appointment in 2 months.

## 2018-05-28 ENCOUNTER — Encounter: Payer: Self-pay | Admitting: Family Medicine

## 2018-05-28 ENCOUNTER — Encounter (HOSPITAL_COMMUNITY): Payer: Self-pay | Admitting: Emergency Medicine

## 2018-05-28 ENCOUNTER — Other Ambulatory Visit: Payer: Self-pay

## 2018-05-28 ENCOUNTER — Ambulatory Visit: Payer: 59 | Admitting: Family Medicine

## 2018-05-28 ENCOUNTER — Ambulatory Visit (HOSPITAL_COMMUNITY)
Admission: EM | Admit: 2018-05-28 | Discharge: 2018-05-28 | Disposition: A | Discharge status: Home / Self Care | Payer: 59 | Attending: Family Medicine | Admitting: Family Medicine

## 2018-05-28 ENCOUNTER — Telehealth: Payer: Self-pay | Admitting: Endocrinology

## 2018-05-28 VITALS — BP 130/90 | HR 74 | Wt 216.0 lb

## 2018-05-28 DIAGNOSIS — R252 Cramp and spasm: Secondary | ICD-10-CM

## 2018-05-28 DIAGNOSIS — R232 Flushing: Secondary | ICD-10-CM

## 2018-05-28 DIAGNOSIS — E669 Obesity, unspecified: Secondary | ICD-10-CM

## 2018-05-28 DIAGNOSIS — R7989 Other specified abnormal findings of blood chemistry: Secondary | ICD-10-CM

## 2018-05-28 DIAGNOSIS — R739 Hyperglycemia, unspecified: Secondary | ICD-10-CM | POA: Diagnosis not present

## 2018-05-28 DIAGNOSIS — E1165 Type 2 diabetes mellitus with hyperglycemia: Secondary | ICD-10-CM

## 2018-05-28 DIAGNOSIS — R945 Abnormal results of liver function studies: Secondary | ICD-10-CM

## 2018-05-28 DIAGNOSIS — M5440 Lumbago with sciatica, unspecified side: Secondary | ICD-10-CM

## 2018-05-28 DIAGNOSIS — I10 Essential (primary) hypertension: Secondary | ICD-10-CM

## 2018-05-28 LAB — POCT URINALYSIS DIP (DEVICE)
BILIRUBIN URINE: NEGATIVE
Glucose, UA: 500 mg/dL — AB
Hgb urine dipstick: NEGATIVE
KETONES UR: NEGATIVE mg/dL
Leukocytes, UA: NEGATIVE
Nitrite: NEGATIVE
PH: 5.5 (ref 5.0–8.0)
Protein, ur: NEGATIVE mg/dL
Urobilinogen, UA: 0.2 mg/dL (ref 0.0–1.0)

## 2018-05-28 LAB — GLUCOSE, POCT (MANUAL RESULT ENTRY): POC Glucose: 278 mg/dl — AB (ref 70–99)

## 2018-05-28 LAB — POCT URINALYSIS DIP (PROADVANTAGE DEVICE)
BILIRUBIN UA: NEGATIVE
BILIRUBIN UA: NEGATIVE mg/dL
Blood, UA: NEGATIVE
GLUCOSE UA: NEGATIVE mg/dL
LEUKOCYTES UA: NEGATIVE
NITRITE UA: NEGATIVE
Specific Gravity, Urine: 1.03
Urobilinogen, Ur: NEGATIVE
pH, UA: 6 (ref 5.0–8.0)

## 2018-05-28 MED ORDER — METFORMIN HCL 1000 MG PO TABS: 1000.0000 mg | ORAL_TABLET | Freq: Two times a day (BID) | ORAL | 1 refills | Status: DC

## 2018-05-28 MED ORDER — KETOROLAC TROMETHAMINE 30 MG/ML IJ SOLN
30.0000 mg | Freq: Once | INTRAMUSCULAR | Status: AC
  Administered 2018-05-28: 30 mg via INTRAMUSCULAR

## 2018-05-28 MED ORDER — NAPROXEN 500 MG PO TABS: 500.0000 mg | ORAL_TABLET | Freq: Two times a day (BID) | ORAL | 0 refills | Status: DC

## 2018-05-28 MED ORDER — KETOROLAC TROMETHAMINE 30 MG/ML IJ SOLN
INTRAMUSCULAR | Status: AC
  Filled 2018-05-28: qty 1

## 2018-05-28 NOTE — Discharge Instructions (Signed)
It was nice meeting you!!  Your urine was negative for infection.  We gave you a Toradol injection for your back pain. You can take Naproxen twice a day with food for pain.

## 2018-05-28 NOTE — Patient Instructions (Addendum)
Your glucose today is 278. We can add back Metformin and have you follow up with your endocrinologist. Continue on Tresiba

## 2018-05-28 NOTE — ED Triage Notes (Addendum)
Pt reports lower back pain that started 1 week ago.  She states in the last three days it has gotten worse.  Pt denies any injury to the back.  Pt also reports having issues with her blood sugar.  It was recommended she call her PCP for further evaluation of her DM.

## 2018-05-28 NOTE — ED Provider Notes (Signed)
[facility]    CSN: [id number] Arrival date & time: 05/28/18  0802     History   Chief Complaint Chief Complaint  Patient presents with  . Back Pain    HPI Jessica Pace is a 57 y.o. female.   Pt is a 57 year old female that presents with lower back pain x 1 week and worsening over the past few days. She reports that its a constant ache in her lumbar spine. At times there is radiation into buttocks and both legs with numbness and tingling. She denies weakness, fever, chills. She denies any injury to the back or heavy lifting. She denies any strenuous exercise. She has not taken anything for the pain. No loss of bowel or bladder.  She denies any dysuria, hematuria but had some urinary frequency.    She also is reporting increased blood sugars over the last month ranging in the 300s.  She is requesting a referral to a new endocrinologist.    ROS per HPI      Past Medical History:  Diagnosis Date  . Complication of anesthesia    "takes me a long time to come out" (09/05/2016)  . Hypertension   . Pneumonia 1990's X 2; 2013  . Type II diabetes mellitus [facility])     Patient Active Problem List   Diagnosis Date Noted  . Elevated LFTs 09/13/2016  . Diverticulitis 09/04/2016  . History of hypothyroidism 09/01/2015  . Obesity (BMI 30-39.9) 08/10/2015  . Diabetes type 2, uncontrolled ([place]) 08/10/2015  . Essential hypertension 08/10/2015  . History of vitamin D deficiency 08/10/2015  . History of headache 08/10/2015    Past Surgical History:  Procedure Laterality Date  . ABDOMINAL HYSTERECTOMY  1998  . CARPAL TUNNEL RELEASE Right ~ 2012  . TUBAL LIGATION  1994    OB History   None      Home Medications    Prior to Admission medications   Medication Sig Start Date End Date Taking? Authorizing Provider  Glucose Blood (BLOOD GLUCOSE TEST STRIPS) STRP Test twice a day. Pt uses one touch verio meter 01/18/17  Yes Henson, Vickie L, NP-C  insulin degludec  (TRESIBA FLEXTOUCH) 100 UNIT/ML SOPN FlexTouch Pen Inject 0.5 mLs (50 Units total) into the skin daily. And pen needles 1/day 04/30/18  Yes Renato Shin, MD  Insulin Pen Needle (PEN NEEDLES) 32G X 4 MM MISC 1 each by Other route daily. Use with Basalgar 12/04/17  Yes Henson, Vickie L, NP-C  olmesartan (BENICAR) 20 MG tablet TAKE 1 TABLET BY MOUTH EVERY DAY 01/31/18  Yes Henson, Vickie L, NP-C  ONETOUCH DELICA LANCETS FINE MISC Test twice a daily. Pt uses a one touch verio meter 01/18/17  Yes Henson, Vickie L, NP-C  Ascorbic Acid (VITAMIN C) 1000 MG tablet Take 1,000 mg by mouth daily.    [provider]  atorvastatin (LIPITOR) 40 MG tablet Take 1 tablet (40 mg total) by mouth daily. 01/18/17   Girtha Rm, NP-C  Cod Liver Oil CAPS Take 1 capsule by mouth every morning.    [provider]  GARLIC OIL PO Take 1 tablet by mouth daily.    [provider]  naproxen (NAPROSYN) 500 MG tablet Take 1 tablet (500 mg total) by mouth 2 (two) times daily. 05/28/18   Orvan July, NP    Family History Family History  Problem Relation Age of Onset  . Diabetes Mother   . Hyperlipidemia Father   . Hypertension Father   .  Heart disease Maternal Grandmother   . Colon cancer Neg Hx   . Stomach cancer Neg Hx   . Rectal cancer Neg Hx   . Esophageal cancer Neg Hx   . Liver cancer Neg Hx     Social History Social History   Tobacco Use  . Smoking status: Never Smoker  . Smokeless tobacco: Never Used  Substance Use Topics  . Alcohol use: No  . Drug use: No     Allergies   Butyl acetate   Review of Systems Review of Systems   Physical Exam Triage Vital Signs ED Triage Vitals [05/28/18 0823]  Enc Vitals Group     BP (!) 150/84     Pulse Rate 79     Resp      Temp 98.4 F (36.9 C)     Temp Source Oral     SpO2 97 %     Weight      Height      Head Circumference      Peak Flow      Pain Score 8     Pain Loc      Pain Edu?      Excl. in [place]?    No data  found.  Updated Vital Signs BP (!) 150/84 (BP Location: Left Arm)   Pulse 79   Temp 98.4 F (36.9 C) (Oral)   SpO2 97%   Visual Acuity Right Eye Distance:   Left Eye Distance:   Bilateral Distance:    Right Eye Near:   Left Eye Near:    Bilateral Near:     Physical Exam  Constitutional: She is oriented to person, place, and time. She appears well-developed and well-nourished.  Neck: Normal range of motion.  Pulmonary/Chest: Effort normal.  Musculoskeletal: Normal range of motion. She exhibits tenderness. She exhibits no edema or deformity.  Pt ambulated without difficulty to the bathroom for urine sample. Tenderness to palpation of the lower lumbar spine.   Neurological: She is alert and oriented to person, place, and time.  Skin: Skin is warm and dry. Capillary refill takes less than 2 seconds.  Psychiatric: She has a normal mood and affect.  Nursing note and vitals reviewed.    UC Treatments / Results  Labs (all labs ordered are listed, but only abnormal results are displayed) Labs Reviewed  POCT URINALYSIS DIP (DEVICE) - Abnormal; Notable for the following components:      Result Value   Glucose, UA 500 (*)    All other components within normal limits    EKG None  Radiology No results found.  Procedures Procedures (including critical care time)  Medications Ordered in UC Medications  ketorolac (TORADOL) 30 MG/ML injection 30 mg (30 mg Intramuscular Given 05/28/18 0837)    Initial Impression / Assessment and Plan / UC Course  I have reviewed the triage vital signs and the nursing notes.  Pertinent labs & imaging results that were available during my care of the patient were reviewed by me and considered in my medical decision making (see chart for details).     We will treat patient's lower back pain with Toradol injection in clinic.  Naproxen twice daily with food.  Patient is to follow-up with her endocrinologist regarding blood sugars.  She is  agreeable to plan. Final Clinical Impressions(s) / UC Diagnoses   Final diagnoses:  Midline low back pain with sciatica, sciatica laterality unspecified, unspecified chronicity     Discharge Instructions     It  was nice meeting you!!  Your urine was negative for infection.  We gave you a Toradol injection for your back pain. You can take Naproxen twice a day with food for pain.     ED Prescriptions    Medication Sig Dispense Auth. Provider   naproxen (NAPROSYN) 500 MG tablet Take 1 tablet (500 mg total) by mouth 2 (two) times daily. 30 tablet Loura Halt A, NP     Controlled Substance Prescriptions [place] Controlled Substance Registry consulted? Not Applicable   Orvan July, NP 05/28/18 [phone]

## 2018-05-28 NOTE — Telephone Encounter (Signed)
-----   Message from [place] sent at 05/28/2018  9:01 AM EDT ----- Regarding: URGENT SWITCH REQUEST  Patient came into the office today and would like to switch from Dr Loanne Drilling to anyone who could see her ASAP. She stated her blood sugars are over 400 and she is very concerned.  She stated the Medication dose has been increased and not helping  Please let me know who can see her?  Thanks!

## 2018-05-28 NOTE — Progress Notes (Signed)
Subjective:    Patient ID: Jessica Pace, female    DOB: 1961/06/25, 57 y.o.   MRN: [id number]  HPI Chief Complaint  Patient presents with  . issues with blood sugars.    running high. treiba isn't working for her. running in the 300's, feeling scatter brained  sweating, cramping of legs, tired, weak    She is here with complaints of hyperglycemia. States her FBS has been over 300 for the past 3-4 weeks.  She has a history of uncontrolled diabetes and I referred her to endocrinology for this.  States she is currently only on [place] and taking 50 units since July 15th.  States she thinks she needs additional medication to get her blood sugars down.   She has not been able to tolerate several diabetes medications.  States glucophage XR was causing diarrhea so this was stopped. She would like to start back on metformin. States this was not giving her issues. SGLT-2 class caused her to have diarrhea. Lantus was not affordable per patient so she was switched to [place].  Was prescribed Januvia in the past and she did not take this.  States she is checking her BS 3 times per day.   States she has stopped sweets and carbohydrates. Today she ate yogurt. Ate avacado and banana sandwich.   States she went to urgent care earlier today for a 2 to 3-week history of low back pain Denies injury.  She also reports increased urination and decreased appetite.  She was prescribed naproxen at the urgent care for her back pain.  No imaging was done. Toradol injection was also given at the urgent care and she does report improvement with pain.  Denies numbness, tingling, weakness.  No loss of control of bowels or bladder.  No saddle anesthesia. She also complains of intermittent leg cramps.  She does have a history of this.  Denies fever, chills, dizziness, chest pain, palpitations, shortness of breath, abdominal pain, N/V/D, LE edema, calf pain or swelling. No claudication symptoms.   Reviewed allergies,  medications, past medical, surgical, family, and social history.   Review of Systems Pertinent positives and negatives in the history of present illness.     Objective:   Physical Exam BP 130/90   Pulse 74   Wt 216 lb (98 kg)   SpO2 96%   BMI 37.08 kg/m   Alert and in no distress.  Pharyngeal area is normal. Cardiac exam shows a regular sinus rhythm without murmurs or gallops. Lungs are clear to auscultation.  Abdomen is soft, nondistended, normal bowel sounds, nontender, no rebound or guarding.  No CVA tenderness.  Back with normal curvature and motion.  Somewhat tender to lumbar paraspinal muscles.  DTRs symmetric and normal.  Negative straight leg raise.  Intact distal pulses.  PERRLA, EOMs intact.  CN intact.  Skin is warm and dry, no pallor or rash.   POCT random glucose 278 POCT urinalysis dipstick: negative      Assessment & Plan:  Uncontrolled type 2 diabetes mellitus with hyperglycemia (HCC) - Plan: CBC with Differential/Platelet, Comprehensive metabolic panel, metFORMIN (GLUCOPHAGE) 1000 MG tablet  Elevated blood sugar - Plan: POCT glucose (manual entry), POCT Urinalysis DIP (Proadvantage Device)  Essential hypertension - Plan: CBC with Differential/Platelet, Comprehensive metabolic panel  Obesity (BMI 30-39.9)  Elevated LFTs - Plan: Comprehensive metabolic panel  Bilateral leg cramps - Plan: CBC with Differential/Platelet, Comprehensive metabolic panel, TSH, T4, free, Magnesium  Hot flashes - Plan: CBC with Differential/Platelet, Comprehensive metabolic panel,  TSH, T4, free  She is under the care of her endocrinologist but is here today with complaints of hyperglycemia. She has had issues with diarrhea related to Metformin in the past but reports only with the extended release. She requests to start back Metformin today and I am ok with this. She will continue on Tresiba and follow up with her endocrinologist.  Low back pain appears to be related to MSK etiology and  she reports relief with Toradol injection from urgent care earlier. She has naproxen prescribed by provider in UC and will try this.  No sign of UTI.  She will let me know if back pain persists and we will consider x-ray and physical therapy if needed. Hot flashes and leg cramps-check labs and have her stay well-hydrated Follow up pending labs.

## 2018-05-29 LAB — COMPREHENSIVE METABOLIC PANEL WITH GFR
ALT: 39 [IU]/L — ABNORMAL HIGH (ref 0–32)
AST: 23 [IU]/L (ref 0–40)
Albumin/Globulin Ratio: 1.4 (ref 1.2–2.2)
Albumin: 4.6 g/dL (ref 3.5–5.5)
Alkaline Phosphatase: 84 [IU]/L (ref 39–117)
BUN/Creatinine Ratio: 21 (ref 9–23)
BUN: 17 mg/dL (ref 6–24)
Bilirubin Total: 0.3 mg/dL (ref 0.0–1.2)
CO2: 20 mmol/L (ref 20–29)
Calcium: 9.8 mg/dL (ref 8.7–10.2)
Chloride: 102 mmol/L (ref 96–106)
Creatinine, Ser: 0.81 mg/dL (ref 0.57–1.00)
GFR calc Af Amer: 92 mL/min/{1.73_m2}
GFR calc non Af Amer: 80 mL/min/{1.73_m2}
Globulin, Total: 3.2 g/dL (ref 1.5–4.5)
Glucose: 253 mg/dL — ABNORMAL HIGH (ref 65–99)
Potassium: 4.8 mmol/L (ref 3.5–5.2)
Sodium: 136 mmol/L (ref 134–144)
Total Protein: 7.8 g/dL (ref 6.0–8.5)

## 2018-05-29 LAB — CBC WITH DIFFERENTIAL/PLATELET
BASOS: 1 %
Basophils Absolute: 0 10*3/uL (ref 0.0–0.2)
EOS (ABSOLUTE): 0.1 10*3/uL (ref 0.0–0.4)
EOS: 2 %
HEMATOCRIT: 38.2 % (ref 34.0–46.6)
HEMOGLOBIN: 11.8 g/dL (ref 11.1–15.9)
IMMATURE GRANS (ABS): 0 10*3/uL (ref 0.0–0.1)
IMMATURE GRANULOCYTES: 0 %
LYMPHS: 38 %
Lymphocytes Absolute: 2.2 10*3/uL (ref 0.7–3.1)
MCH: 26.3 pg — ABNORMAL LOW (ref 26.6–33.0)
MCHC: 30.9 g/dL — ABNORMAL LOW (ref 31.5–35.7)
MCV: 85 fL (ref 79–97)
Monocytes Absolute: 0.4 10*3/uL (ref 0.1–0.9)
Monocytes: 7 %
NEUTROS ABS: 3 10*3/uL (ref 1.4–7.0)
NEUTROS PCT: 52 %
Platelets: 221 10*3/uL (ref 150–450)
RBC: 4.48 x10E6/uL (ref 3.77–5.28)
RDW: 15.1 % (ref 12.3–15.4)
WBC: 5.7 10*3/uL (ref 3.4–10.8)

## 2018-05-29 LAB — T4, FREE: Free T4: 0.88 ng/dL (ref 0.82–1.77)

## 2018-05-29 LAB — MAGNESIUM: MAGNESIUM: 1.8 mg/dL (ref 1.6–2.3)

## 2018-05-29 LAB — TSH: TSH: 5.19 u[IU]/mL — ABNORMAL HIGH (ref 0.450–4.500)

## 2018-05-31 ENCOUNTER — Telehealth: Payer: Self-pay | Admitting: Family Medicine

## 2018-05-31 NOTE — Telephone Encounter (Signed)
Pt called and stated that her blood sugar was 386 and had had been running high and Vickie NP stated she needed to go see her Endocrinologist for her sugars,  informed pt and she stated she was not going back to them because they do not listen to her and she stated she was going to go the ER so they can help her, states she may not come back to our office since we was not able to help her. Informed NP of this

## 2018-06-19 ENCOUNTER — Other Ambulatory Visit: Payer: Self-pay | Admitting: Family Medicine

## 2018-06-19 DIAGNOSIS — E1165 Type 2 diabetes mellitus with hyperglycemia: Secondary | ICD-10-CM

## 2018-06-24 ENCOUNTER — Other Ambulatory Visit: Payer: Self-pay | Admitting: Family Medicine

## 2018-06-24 DIAGNOSIS — I10 Essential (primary) hypertension: Secondary | ICD-10-CM

## 2018-07-20 ENCOUNTER — Other Ambulatory Visit: Payer: Self-pay | Admitting: Family Medicine

## 2018-07-20 DIAGNOSIS — E1165 Type 2 diabetes mellitus with hyperglycemia: Secondary | ICD-10-CM

## 2018-07-27 ENCOUNTER — Other Ambulatory Visit: Payer: Self-pay

## 2018-07-27 DIAGNOSIS — E1165 Type 2 diabetes mellitus with hyperglycemia: Secondary | ICD-10-CM

## 2018-07-27 NOTE — Telephone Encounter (Signed)
Pt SEES ENDOCRINOLOGY not [place] for DM. Last seen here with high BS because ENDO couldn't see her the same day. Pt was already advised to go back to ENDOCRINOLOGY for DM

## 2018-07-27 NOTE — Telephone Encounter (Signed)
Pharmacy has sent a request for a refill on the pending medication

## 2018-10-31 ENCOUNTER — Other Ambulatory Visit: Payer: Self-pay | Admitting: Endocrinology

## 2018-10-31 ENCOUNTER — Other Ambulatory Visit: Payer: Self-pay | Admitting: Family Medicine

## 2018-10-31 DIAGNOSIS — Z1231 Encounter for screening mammogram for malignant neoplasm of breast: Secondary | ICD-10-CM

## 2018-11-30 ENCOUNTER — Ambulatory Visit
Admission: RE | Admit: 2018-11-30 | Discharge: 2018-11-30 | Disposition: A | Discharge status: Home / Self Care | Payer: 59 | Source: Ambulatory Visit | Attending: Family Medicine | Admitting: Family Medicine

## 2018-11-30 DIAGNOSIS — Z1231 Encounter for screening mammogram for malignant neoplasm of breast: Secondary | ICD-10-CM

## 2019-03-06 ENCOUNTER — Telehealth: Payer: Self-pay | Admitting: Family Medicine

## 2019-03-06 NOTE — Telephone Encounter (Signed)
Called pt to schedule diabetes check & Pt states she is seeing a different primary care doctor in the office with her endocrinologist.

## 2019-05-01 ENCOUNTER — Other Ambulatory Visit: Payer: Self-pay | Admitting: Internal Medicine

## 2019-05-01 DIAGNOSIS — Z20822 Contact with and (suspected) exposure to covid-19: Secondary | ICD-10-CM

## 2019-05-06 LAB — NOVEL CORONAVIRUS, NAA: SARS-CoV-2, NAA: NOT DETECTED

## 2019-08-02 ENCOUNTER — Other Ambulatory Visit: Payer: Self-pay

## 2019-08-02 DIAGNOSIS — Z20822 Contact with and (suspected) exposure to covid-19: Secondary | ICD-10-CM

## 2019-08-04 LAB — NOVEL CORONAVIRUS, NAA: SARS-CoV-2, NAA: NOT DETECTED

## 2019-09-07 ENCOUNTER — Other Ambulatory Visit: Payer: Self-pay

## 2019-09-07 ENCOUNTER — Emergency Department (HOSPITAL_COMMUNITY)
Admission: EM | Admit: 2019-09-07 | Discharge: 2019-09-07 | Disposition: A | Discharge status: Home / Self Care | Payer: 59 | Attending: Emergency Medicine | Admitting: Emergency Medicine

## 2019-09-07 ENCOUNTER — Emergency Department (HOSPITAL_COMMUNITY): Payer: 59

## 2019-09-07 ENCOUNTER — Encounter (HOSPITAL_COMMUNITY): Payer: Self-pay | Admitting: Emergency Medicine

## 2019-09-07 DIAGNOSIS — E119 Type 2 diabetes mellitus without complications: Secondary | ICD-10-CM | POA: Insufficient documentation

## 2019-09-07 DIAGNOSIS — I10 Essential (primary) hypertension: Secondary | ICD-10-CM | POA: Diagnosis not present

## 2019-09-07 DIAGNOSIS — K5792 Diverticulitis of intestine, part unspecified, without perforation or abscess without bleeding: Secondary | ICD-10-CM | POA: Diagnosis not present

## 2019-09-07 DIAGNOSIS — Z7984 Long term (current) use of oral hypoglycemic drugs: Secondary | ICD-10-CM | POA: Diagnosis not present

## 2019-09-07 DIAGNOSIS — Z79899 Other long term (current) drug therapy: Secondary | ICD-10-CM | POA: Diagnosis not present

## 2019-09-07 DIAGNOSIS — R1032 Left lower quadrant pain: Secondary | ICD-10-CM | POA: Diagnosis present

## 2019-09-07 DIAGNOSIS — R911 Solitary pulmonary nodule: Secondary | ICD-10-CM | POA: Insufficient documentation

## 2019-09-07 LAB — CBC WITH DIFFERENTIAL/PLATELET
Abs Immature Granulocytes: 0.01 10*3/uL (ref 0.00–0.07)
Basophils Absolute: 0 10*3/uL (ref 0.0–0.1)
Basophils Relative: 1 %
Eosinophils Absolute: 0.1 10*3/uL (ref 0.0–0.5)
Eosinophils Relative: 1 %
HCT: 39.7 % (ref 36.0–46.0)
Hemoglobin: 13.2 g/dL (ref 12.0–15.0)
Immature Granulocytes: 0 %
Lymphocytes Relative: 27 %
Lymphs Abs: 1.7 10*3/uL (ref 0.7–4.0)
MCH: 28.7 pg (ref 26.0–34.0)
MCHC: 33.2 g/dL (ref 30.0–36.0)
MCV: 86.3 fL (ref 80.0–100.0)
Monocytes Absolute: 0.4 10*3/uL (ref 0.1–1.0)
Monocytes Relative: 7 %
Neutro Abs: 4.1 10*3/uL (ref 1.7–7.7)
Neutrophils Relative %: 64 %
Platelets: 195 10*3/uL (ref 150–400)
RBC: 4.6 MIL/uL (ref 3.87–5.11)
RDW: 14.3 % (ref 11.5–15.5)
WBC: 6.3 10*3/uL (ref 4.0–10.5)
nRBC: 0 % (ref 0.0–0.2)

## 2019-09-07 LAB — COMPREHENSIVE METABOLIC PANEL
ALT: 34 U/L (ref 0–44)
AST: 25 U/L (ref 15–41)
Albumin: 3.9 g/dL (ref 3.5–5.0)
Alkaline Phosphatase: 81 U/L (ref 38–126)
Anion gap: 11 (ref 5–15)
BUN: 11 mg/dL (ref 6–20)
CO2: 22 mmol/L (ref 22–32)
Calcium: 9 mg/dL (ref 8.9–10.3)
Chloride: 98 mmol/L (ref 98–111)
Creatinine, Ser: 0.78 mg/dL (ref 0.44–1.00)
GFR calc Af Amer: >60 mL/min (ref >60)
GFR calc non Af Amer: >60 mL/min (ref >60)
Glucose, Bld: 304 mg/dL — ABNORMAL HIGH (ref 70–99)
Potassium: 4.4 mmol/L (ref 3.5–5.1)
Sodium: 131 mmol/L — ABNORMAL LOW (ref 135–145)
Total Bilirubin: 1 mg/dL (ref 0.3–1.2)
Total Protein: 7.5 g/dL (ref 6.5–8.1)

## 2019-09-07 MED ORDER — POLYETHYLENE GLYCOL 3350 17 G PO PACK: 17.0000 g | PACK | Freq: Every day | ORAL | 0 refills | Status: DC

## 2019-09-07 MED ORDER — IOHEXOL 300 MG/ML  SOLN
100.0000 mL | Freq: Once | INTRAMUSCULAR | Status: AC | PRN
  Administered 2019-09-07: 100 mL via INTRAVENOUS

## 2019-09-07 MED ORDER — ACETAMINOPHEN 500 MG PO TABS
1000.0000 mg | ORAL_TABLET | Freq: Once | ORAL | Status: AC
Start: 2019-09-07 — End: 2019-09-07
  Administered 2019-09-07: 1000 mg via ORAL
  Filled 2019-09-07: qty 2

## 2019-09-07 MED ORDER — MORPHINE SULFATE (PF) 4 MG/ML IV SOLN: 4.0000 mg | Freq: Once | INTRAVENOUS | Status: DC

## 2019-09-07 MED ORDER — AMOXICILLIN-POT CLAVULANATE 875-125 MG PO TABS: 1.0000 | ORAL_TABLET | Freq: Two times a day (BID) | ORAL | 0 refills | Status: AC

## 2019-09-07 MED ORDER — HYDROCODONE-ACETAMINOPHEN 5-325 MG PO TABS: 1.0000 | ORAL_TABLET | Freq: Four times a day (QID) | ORAL | 0 refills | Status: DC | PRN

## 2019-09-07 MED ORDER — AMOXICILLIN-POT CLAVULANATE 875-125 MG PO TABS: 1.0000 | ORAL_TABLET | Freq: Two times a day (BID) | ORAL | 0 refills | Status: DC

## 2019-09-07 NOTE — ED Triage Notes (Signed)
Patient complains of constant abdominal pain that radiates upward. History of diverticulitis in 2017. Reports increased urination, pain with bowel movements. Denies nausea, vomiting, fever. Patient alert, oriented, and in no apparent distress at this time.

## 2019-09-07 NOTE — Discharge Instructions (Signed)
Take the antibiotics as prescribed.  Take the entire course, even if your symptoms improve. Use Tylenol as needed for mild to moderate pain.  Use Norco as needed for severe breakthrough pain.  Have caution, this may make you tired or groggy.  Do not drive or operate heavy machinery while taking medicine. Make sure you are staying well-hydrated water. Use miralax as needed for encourage regular bowel movements.  Follow-up with your primary care doctor for further evaluation of the pulmonary nodule.  Follow up with your GI doctor as needed.  Return to the emergency room if you develop high fevers, severe worsening pain, persistent vomiting, any new new, worsening, or concerning symptoms.

## 2019-09-07 NOTE — ED Provider Notes (Signed)
[facility] EMERGENCY DEPARTMENT Provider Note   CSN: [id number] Arrival date & time: 09/07/19  1008     History   Chief Complaint Chief Complaint  Patient presents with   Abdominal Pain    HPI Jessica Pace is a 58 y.o. female presenting for evaluation of abdominal pain.  Patient states 2 days ago she started to develop left lower quadrant abdominal pain.  Since then, pain has gradually worsened.  She also reports associated domino bloating.  She states occasionally pain radiates up, but mostly stays in the left lower quadrant.  This feels similar to when she had diverticulitis 3 years ago.  She denies fevers, chills, chest pain, shortness breath, cough, nausea, vomiting, urinary symptoms.  Patient states that when she has a bowel movement, her pain feels worse.  She has not noticed blood in her stool.  She has a history of diabetes and hypertension, no other medical problems.  Has a history of a laparoscopic hysterectomy, still has her ovaries.     HPI  Past Medical History:  Diagnosis Date   Complication of anesthesia    "takes me a long time to come out" (09/05/2016)   Hypertension    Pneumonia 1990's X 2; 2013   Type II diabetes mellitus [facility])     Patient Active Problem List   Diagnosis Date Noted   Elevated LFTs 09/13/2016   Diverticulitis 09/04/2016   History of hypothyroidism 09/01/2015   Obesity (BMI 30-39.9) 08/10/2015   Diabetes type 2, uncontrolled ([place]) 08/10/2015   Essential hypertension 08/10/2015   History of vitamin D deficiency 08/10/2015   History of headache 08/10/2015    Past Surgical History:  Procedure Laterality Date   ABDOMINAL HYSTERECTOMY  1998   CARPAL TUNNEL RELEASE Right ~ 2012   TUBAL LIGATION  1994     OB History   No obstetric history on file.      Home Medications    Prior to Admission medications   Medication Sig Start Date End Date Taking? Authorizing Provider  amoxicillin-clavulanate  (AUGMENTIN) 875-125 MG tablet Take 1 tablet by mouth every 12 (twelve) hours for 10 days. 09/07/19 09/17/19  Jovanna Hodges, PA-C  Ascorbic Acid (VITAMIN C) 1000 MG tablet Take 1,000 mg by mouth daily.    [provider]  atorvastatin (LIPITOR) 40 MG tablet Take 1 tablet (40 mg total) by mouth daily. 01/18/17   Girtha Rm, NP-C  Cod Liver Oil CAPS Take 1 capsule by mouth every morning.    [provider]  GARLIC OIL PO Take 1 tablet by mouth daily.    [provider]  Glucose Blood (BLOOD GLUCOSE TEST STRIPS) STRP Test twice a day. Pt uses one touch verio meter 01/18/17   Henson, Vickie L, NP-C  HYDROcodone-acetaminophen (NORCO/VICODIN) 5-325 MG tablet Take 1 tablet by mouth every 6 (six) hours as needed. 09/07/19   Amaury Kuzel, PA-C  insulin degludec (TRESIBA FLEXTOUCH) 100 UNIT/ML SOPN FlexTouch Pen Inject 0.5 mLs (50 Units total) into the skin daily. And pen needles 1/day 04/30/18   Renato Shin, MD  Insulin Pen Needle (PEN NEEDLES) 32G X 4 MM MISC 1 each by Other route daily. Use with Basalgar 12/04/17   Henson, Vickie L, NP-C  metFORMIN (GLUCOPHAGE) 1000 MG tablet Take 1 tablet (1,000 mg total) by mouth 2 (two) times daily with a meal. 05/28/18   Henson, Vickie L, NP-C  naproxen (NAPROSYN) 500 MG tablet Take 1 tablet (500 mg total) by mouth 2 (two) times  daily. 05/28/18   Loura Halt A, NP  olmesartan (BENICAR) 20 MG tablet TAKE 1 TABLET BY MOUTH EVERY DAY 06/25/18   Henson, Vickie L, NP-C  ONETOUCH DELICA LANCETS FINE MISC Test twice a daily. Pt uses a one touch verio meter 01/18/17   Henson, Vickie L, NP-C  polyethylene glycol (MIRALAX MIX-IN PAX) 17 g packet Take 17 g by mouth daily. 09/07/19   Quatisha Zylka, PA-C    Family History Family History  Problem Relation Age of Onset   Diabetes Mother    Hyperlipidemia Father    Hypertension Father    Heart disease Maternal Grandmother    Colon cancer Neg Hx    Stomach cancer Neg Hx    Rectal cancer  Neg Hx    Esophageal cancer Neg Hx    Liver cancer Neg Hx    Breast cancer Neg Hx     Social History Social History   Tobacco Use   Smoking status: Never Smoker   Smokeless tobacco: Never Used  Substance Use Topics   Alcohol use: No   Drug use: No     Allergies   Butyl acetate   Review of Systems Review of Systems  Gastrointestinal: Positive for abdominal distention and abdominal pain.  All other systems reviewed and are negative.    Physical Exam Updated Vital Signs BP 115/64    Pulse 78    Ht 5\' 4"  (1.626 m)    Wt 89.8 kg    SpO2 100%    BMI 33.99 kg/m   Physical Exam Vitals signs and nursing note reviewed.  Constitutional:      General: She is not in acute distress.    Appearance: She is well-developed.     Comments: Resting comfortably in the bed no acute distress  HENT:     Head: Normocephalic and atraumatic.  Eyes:     Conjunctiva/sclera: Conjunctivae normal.     Pupils: Pupils are equal, round, and reactive to light.  Neck:     Musculoskeletal: Normal range of motion and neck supple.  Cardiovascular:     Rate and Rhythm: Normal rate and regular rhythm.     Pulses: Normal pulses.  Pulmonary:     Effort: Pulmonary effort is normal. No respiratory distress.     Breath sounds: Normal breath sounds. No wheezing.  Abdominal:     General: Bowel sounds are normal. There is no distension.     Palpations: Abdomen is soft.     Tenderness: There is abdominal tenderness in the left lower quadrant.     Comments: Obese abd. tenderness palpation of left lower quadrant.  No rigidity, guarding, distention.  Negative rebound. Normal BS  Musculoskeletal: Normal range of motion.  Skin:    General: Skin is warm and dry.     Capillary Refill: Capillary refill takes less than 2 seconds.  Neurological:     Mental Status: She is alert and oriented to person, place, and time.      ED Treatments / Results  Labs (all labs ordered are listed, but only abnormal  results are displayed) Labs Reviewed  COMPREHENSIVE METABOLIC PANEL - Abnormal; Notable for the following components:      Result Value   Sodium 131 (*)    Glucose, Bld 304 (*)    All other components within normal limits  CBC WITH DIFFERENTIAL/PLATELET  URINALYSIS, ROUTINE W REFLEX MICROSCOPIC  CBG MONITORING, ED    EKG None  Radiology Ct Abdomen Pelvis W Contrast  Result Date: 09/07/2019  CLINICAL DATA:  Abdominal pain.  History of diverticulitis. EXAM: CT ABDOMEN AND PELVIS WITH CONTRAST TECHNIQUE: Multidetector CT imaging of the abdomen and pelvis was performed using the standard protocol following bolus administration of intravenous contrast. CONTRAST:  132mL OMNIPAQUE IOHEXOL 300 MG/ML  SOLN COMPARISON:  09/04/2016 CT abdomen/pelvis. FINDINGS: Lower chest: Right lower lobe 4 mm solid pulmonary nodule (series 4/image 4), not previously imaged. Hepatobiliary: Normal liver size. No liver mass. Normal gallbladder with no radiopaque cholelithiasis. No biliary ductal dilatation. Pancreas: Normal, with no mass or duct dilation. Spleen: Normal size. No mass. Adrenals/Urinary Tract: Normal adrenals. Normal kidneys with no hydronephrosis and no renal mass. Normal bladder. Stomach/Bowel: Normal non-distended stomach. Normal caliber small bowel with no small bowel wall thickening. Normal appendix. Moderate left colonic diverticulosis. Segmental wall thickening with pericolonic fat stranding and ill-defined fluid in the proximal sigmoid colon, compatible with acute sigmoid diverticulitis. No pericolonic free air or abscess. Vascular/Lymphatic: Mildly atherosclerotic nonaneurysmal abdominal aorta. Patent portal, splenic, hepatic and renal veins. No pathologically enlarged lymph nodes in the abdomen or pelvis. Reproductive: Status post hysterectomy, with no abnormal findings at the vaginal cuff. No adnexal mass. Other: No pneumoperitoneum, ascites or focal fluid collection. Musculoskeletal: No aggressive  appearing focal osseous lesions. Mild thoracolumbar spondylosis. IMPRESSION: 1. Acute sigmoid diverticulitis.  No free air or abscess. 2. Solid 4 mm right lower lobe pulmonary nodule. No follow-up needed if patient is low-risk. Non-contrast chest CT can be considered in 12 months if patient is high-risk. This recommendation follows the consensus statement: Guidelines for Management of Incidental Pulmonary Nodules Detected on CT Images:From the Fleischner Society 2017; published online before print (10.1148/radiol.IJ:2314499). 3.  Aortic Atherosclerosis (ICD10-I70.0). Electronically Signed   By: Ilona Sorrel M.D.   On: 09/07/2019 11:54    Procedures Procedures (including critical care time)  Medications Ordered in ED Medications  morphine 4 MG/ML injection 4 mg (0 mg Intravenous Hold 09/07/19 1026)  acetaminophen (TYLENOL) tablet 1,000 mg (1,000 mg Oral Given 09/07/19 1046)  iohexol (OMNIPAQUE) 300 MG/ML solution 100 mL (100 mLs Intravenous Contrast Given 09/07/19 1128)     Initial Impression / Assessment and Plan / ED Course  I have reviewed the triage vital signs and the nursing notes.  Pertinent labs & imaging results that were available during my care of the patient were reviewed by me and considered in my medical decision making (see chart for details).        Patient presenting for evaluation of abdominal pain.  Physical exam shows patient appears nontoxic.  No fever or tachycardia.  No signs of sepsis.  She does have tenderness palpation the left lower quadrant and a history of diverticulitis.  As such, will obtain labs and CT for further evaluation.  Patient declined morphine for pain, as such we will give Tylenol.  No nausea at this time.  Labs reassuring.  No leukocytosis.  Glucose mildly elevated at 300.  CT abdomen pelvis consistent with acute diverticulitis.  Discussed findings with patient.  Discussed Intermatic treatment for pain as well as treatment with antibiotics.   Discussed close monitoring of symptoms, and return if worsening.  Additionally, CT showed pulmonary nodule, discussed with pt. At this time, pt appears safe for d/c. Return precautions given. Pt states she understands and agrees to plan.   Final Clinical Impressions(s) / ED Diagnoses   Final diagnoses:  Diverticulitis  Pulmonary nodule    ED Discharge Orders         Ordered    HYDROcodone-acetaminophen (NORCO/VICODIN) 5-325  MG tablet  Every 6 hours PRN,   Status:  Discontinued     09/07/19 1241    amoxicillin-clavulanate (AUGMENTIN) 875-125 MG tablet  Every 12 hours,   Status:  Discontinued     09/07/19 1241    polyethylene glycol (MIRALAX MIX-IN PAX) 17 g packet  Daily,   Status:  Discontinued     09/07/19 1242    amoxicillin-clavulanate (AUGMENTIN) 875-125 MG tablet  Every 12 hours     09/07/19 1245    HYDROcodone-acetaminophen (NORCO/VICODIN) 5-325 MG tablet  Every 6 hours PRN     09/07/19 1245    polyethylene glycol (MIRALAX MIX-IN PAX) 17 g packet  Daily     09/07/19 Holland, Jonaya Freshour, PA-C 09/07/19 1432    Tegeler, Gwenyth Allegra, MD 09/07/19 1635

## 2019-10-04 ENCOUNTER — Ambulatory Visit: Payer: 59 | Attending: Internal Medicine

## 2019-10-04 DIAGNOSIS — Z20822 Contact with and (suspected) exposure to covid-19: Secondary | ICD-10-CM

## 2019-10-05 LAB — NOVEL CORONAVIRUS, NAA: SARS-CoV-2, NAA: NOT DETECTED

## 2019-10-14 ENCOUNTER — Ambulatory Visit: Payer: 59 | Attending: Internal Medicine

## 2019-10-14 DIAGNOSIS — Z20822 Contact with and (suspected) exposure to covid-19: Secondary | ICD-10-CM

## 2019-10-16 LAB — NOVEL CORONAVIRUS, NAA: SARS-CoV-2, NAA: DETECTED — AB

## 2019-10-17 ENCOUNTER — Other Ambulatory Visit: Payer: Self-pay | Admitting: Nurse Practitioner

## 2019-10-17 DIAGNOSIS — E1165 Type 2 diabetes mellitus with hyperglycemia: Secondary | ICD-10-CM

## 2019-10-17 DIAGNOSIS — U071 COVID-19: Secondary | ICD-10-CM

## 2019-10-17 NOTE — Progress Notes (Signed)
  I connected by phone with Jessica Pace on 10/17/2019 at 9:47 AM to discuss the potential use of an new treatment for mild to moderate COVID-19 viral infection in non-hospitalized patients.  This patient is a 58 y.o. female that meets the FDA criteria for Emergency Use Authorization of bamlanivimab or casirivimab\imdevimab.  Has a (+) direct SARS-CoV-2 viral test result  Has mild or moderate COVID-19   Is ? 60 years of age and weighs ? 40 kg  Is NOT hospitalized due to COVID-19  Is NOT requiring oxygen therapy or requiring an increase in baseline oxygen flow rate due to COVID-19  Is within 10 days of symptom onset  Has at least one of the high risk factor(s) for progression to severe COVID-19 and/or hospitalization as defined in EUA.  Specific high risk criteria : Diabetes   I have spoken and communicated the following to the patient or parent/caregiver:  1. FDA has authorized the emergency use of bamlanivimab and casirivimab\imdevimab for the treatment of mild to moderate COVID-19 in adults and pediatric patients with positive results of direct SARS-CoV-2 viral testing who are 32 years of age and older weighing at least 40 kg, and who are at high risk for progressing to severe COVID-19 and/or hospitalization.  2. The significant known and potential risks and benefits of bamlanivimab and casirivimab\imdevimab, and the extent to which such potential risks and benefits are unknown.  3. Information on available alternative treatments and the risks and benefits of those alternatives, including clinical trials.  4. Patients treated with bamlanivimab and casirivimab\imdevimab should continue to self-isolate and use infection control measures (e.g., wear mask, isolate, social distance, avoid sharing personal items, clean and disinfect "high touch" surfaces, and frequent handwashing) according to CDC guidelines.   5. The patient or parent/caregiver has the option to accept or refuse  bamlanivimab or casirivimab\imdevimab .  After reviewing this information with the patient, The patient agreed to proceed with receiving the bamlanimivab infusion and will be provided a copy of the Fact sheet prior to receiving the infusion.[name] 10/17/2019 9:47 AM

## 2019-10-21 ENCOUNTER — Ambulatory Visit (HOSPITAL_COMMUNITY)
Admission: RE | Admit: 2019-10-21 | Discharge: 2019-10-21 | Disposition: A | Discharge status: Home / Self Care | Payer: 59 | Source: Ambulatory Visit | Attending: Pulmonary Disease | Admitting: Pulmonary Disease

## 2019-10-21 DIAGNOSIS — E1165 Type 2 diabetes mellitus with hyperglycemia: Secondary | ICD-10-CM | POA: Insufficient documentation

## 2019-10-21 DIAGNOSIS — Z23 Encounter for immunization: Secondary | ICD-10-CM | POA: Diagnosis not present

## 2019-10-21 DIAGNOSIS — U071 COVID-19: Secondary | ICD-10-CM | POA: Insufficient documentation

## 2019-10-21 MED ORDER — DIPHENHYDRAMINE HCL 50 MG/ML IJ SOLN: 50.0000 mg | Freq: Once | INTRAMUSCULAR | Status: DC | PRN

## 2019-10-21 MED ORDER — SODIUM CHLORIDE 0.9 % IV SOLN
700.0000 mg | Freq: Once | INTRAVENOUS | Status: AC
  Administered 2019-10-21: 15:00:00 700 mg via INTRAVENOUS
  Filled 2019-10-21: qty 20

## 2019-10-21 MED ORDER — EPINEPHRINE 0.3 MG/0.3ML IJ SOAJ: 0.3000 mg | Freq: Once | INTRAMUSCULAR | Status: DC | PRN

## 2019-10-21 MED ORDER — METHYLPREDNISOLONE SODIUM SUCC 125 MG IJ SOLR: 125.0000 mg | Freq: Once | INTRAMUSCULAR | Status: DC | PRN

## 2019-10-21 MED ORDER — ALBUTEROL SULFATE HFA 108 (90 BASE) MCG/ACT IN AERS: 2.0000 | INHALATION_SPRAY | Freq: Once | RESPIRATORY_TRACT | Status: DC | PRN

## 2019-10-21 MED ORDER — FAMOTIDINE IN NACL 20-0.9 MG/50ML-% IV SOLN: 20.0000 mg | Freq: Once | INTRAVENOUS | Status: DC | PRN

## 2019-10-21 MED ORDER — SODIUM CHLORIDE 0.9 % IV SOLN: INTRAVENOUS | Status: DC | PRN

## 2019-10-21 NOTE — Progress Notes (Signed)
  Diagnosis: COVID-19  Physician: Dr. Joya Gaskins  Procedure: [facility] Med: bamlanivimab infusion - Provided patient with bamlanimivab fact sheet for patients, parents and caregivers prior to infusion.  Complications: No immediate complications noted.  Discharge: Discharged home   Janine Ores 10/21/2019

## 2019-10-21 NOTE — Discharge Instructions (Signed)

## 2019-11-20 ENCOUNTER — Telehealth: Payer: Self-pay | Admitting: "Endocrinology

## 2019-11-20 NOTE — Telephone Encounter (Signed)
Pt said the pen that was called in is going to be over 90.00. was it [place]?

## 2019-11-21 NOTE — Telephone Encounter (Signed)
Jessica Pace is what I see in her med list.

## 2020-02-17 IMAGING — CT CT ABD-PELV W/ CM
2 of 5 series · 16 of 46 positions shown, 18 images · IV contrast (APPLIED)
Comparison: 09/04/2016 CT abdomen/pelvis.

CLINICAL DATA: Abdominal pain.  History of diverticulitis.

EXAM:
CT ABDOMEN AND PELVIS WITH CONTRAST
TECHNIQUE: Multidetector CT imaging of the abdomen and pelvis was performed
using the standard protocol following bolus administration of
intravenous contrast.
CONTRAST:  100mL OMNIPAQUE IOHEXOL 300 MG/ML  SOLN

[Series 3: abd/ pelvis 5.0 i30f 2 · axial · 0.90mm/px · z∈[-701,-241]mm · 13 of 104 slices shown, 15 images]
[im 6/104  soft-tissue]
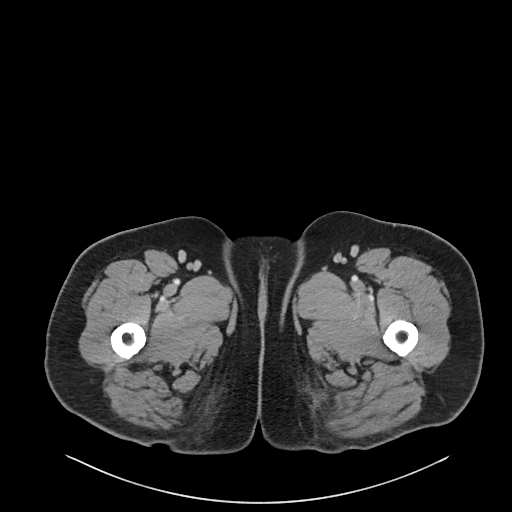
[im 6/104  bone]
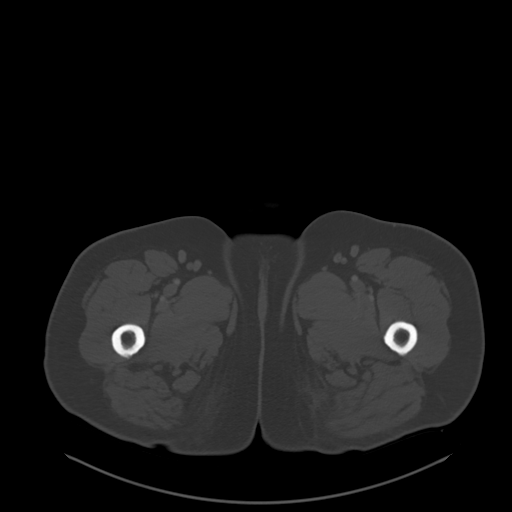
[im 17/104  soft-tissue]
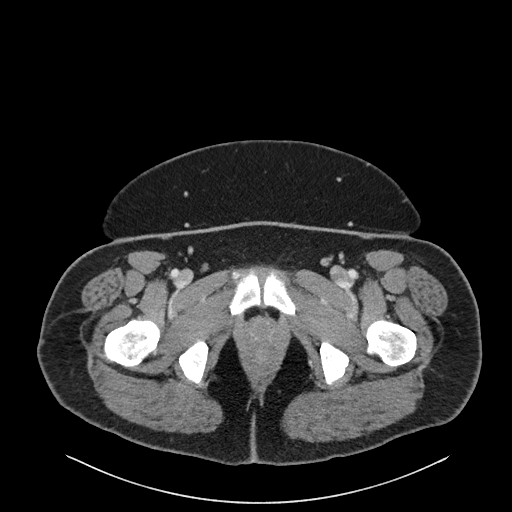
[im 22/104  soft-tissue]
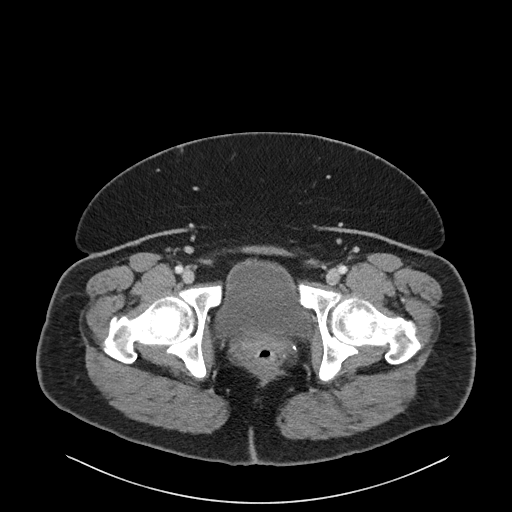
[im 28/104  soft-tissue]
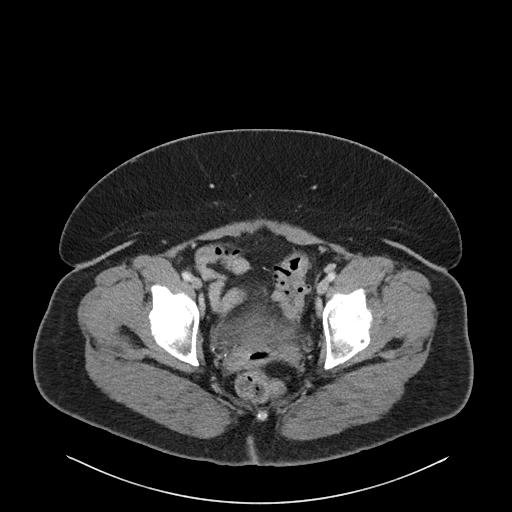
[im 38/104  soft-tissue]
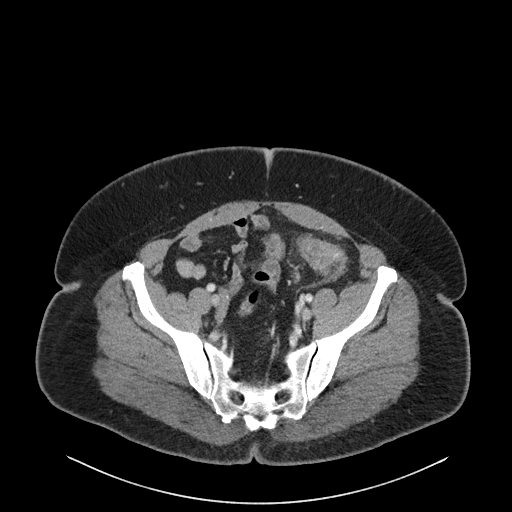
[im 44/104  soft-tissue]
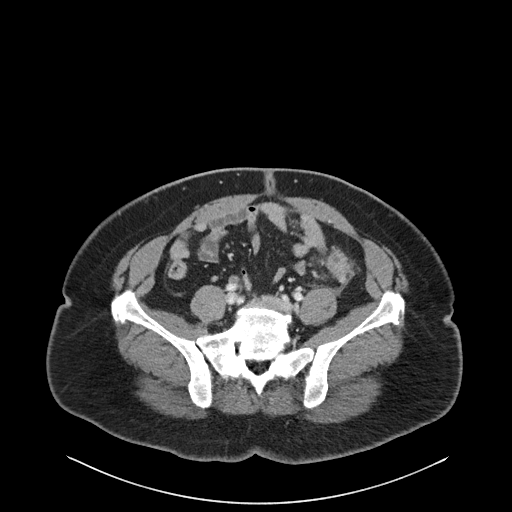
[im 55/104  soft-tissue]
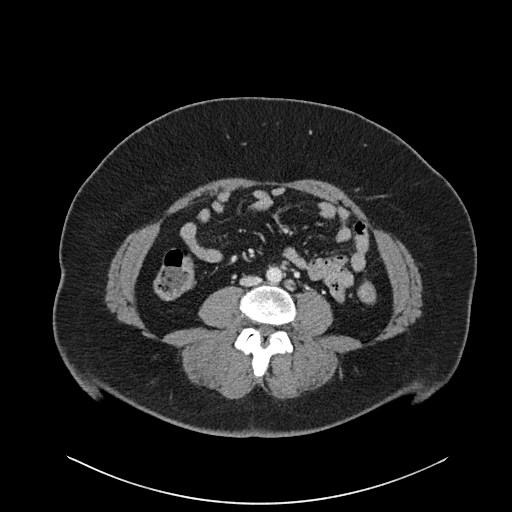
[im 60/104  soft-tissue]
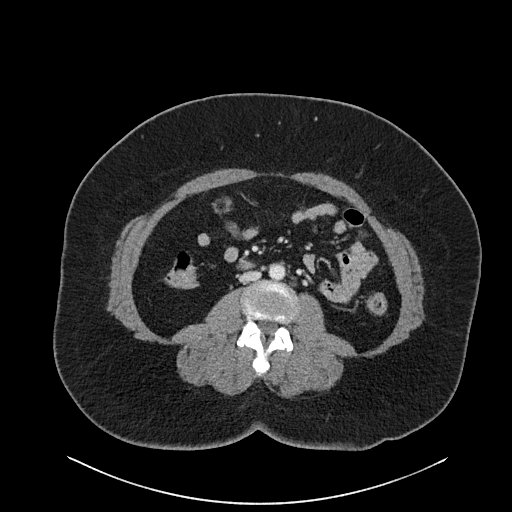
[im 66/104  soft-tissue]
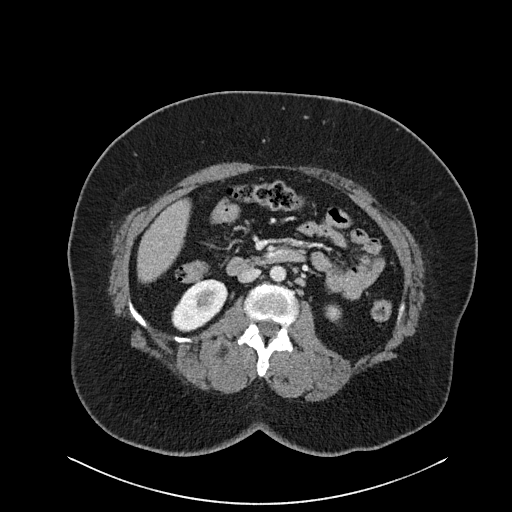
[im 66/104  bone]
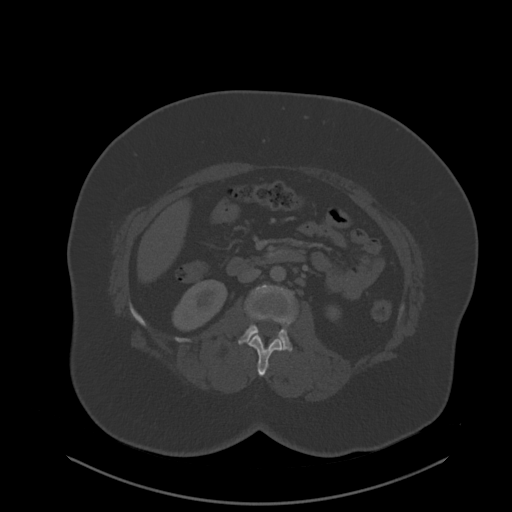
[im 76/104  soft-tissue]
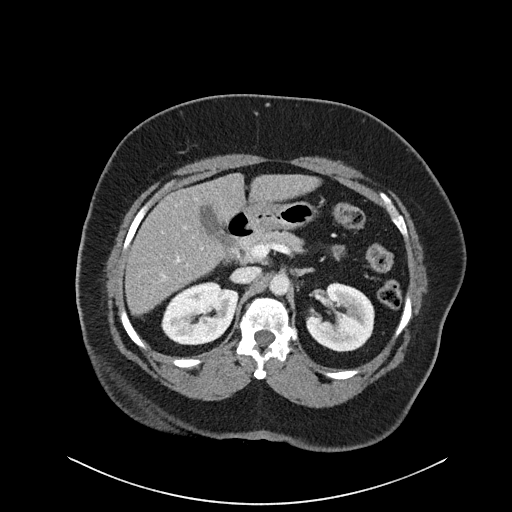
[im 82/104  soft-tissue]
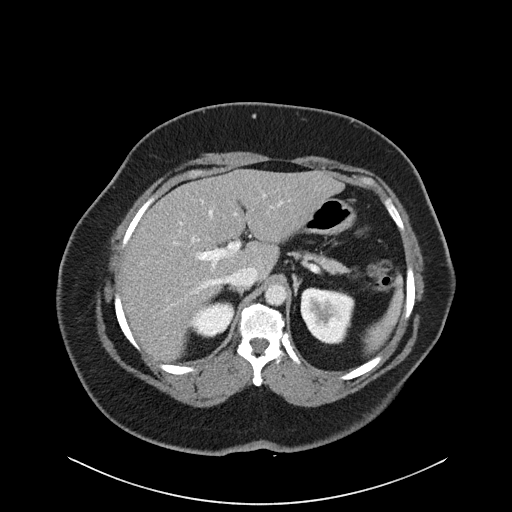
[im 87/104  soft-tissue]
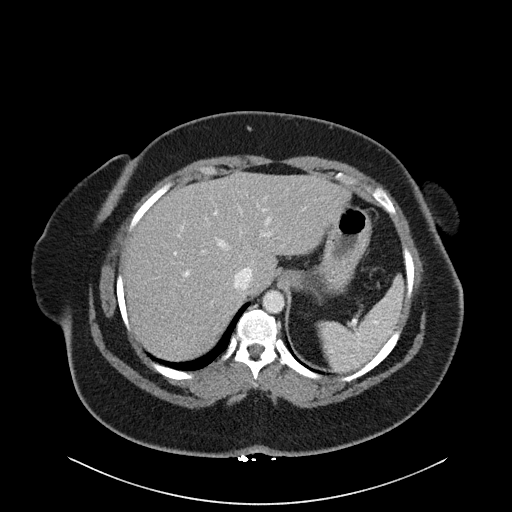
[im 98/104  soft-tissue]
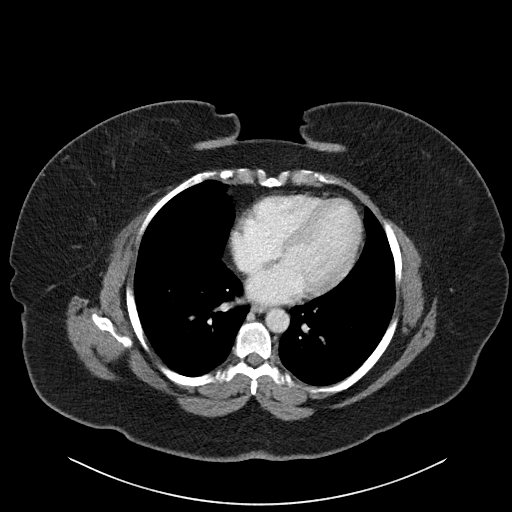

[Series 6: coronal soft tissue · coronal · 0.90mm/px · 3 of 125 slices shown]
[im 42/125  soft-tissue]
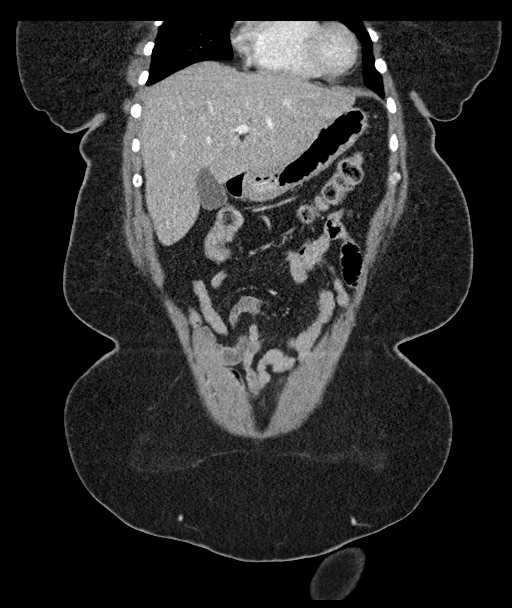
[im 56/125  soft-tissue]
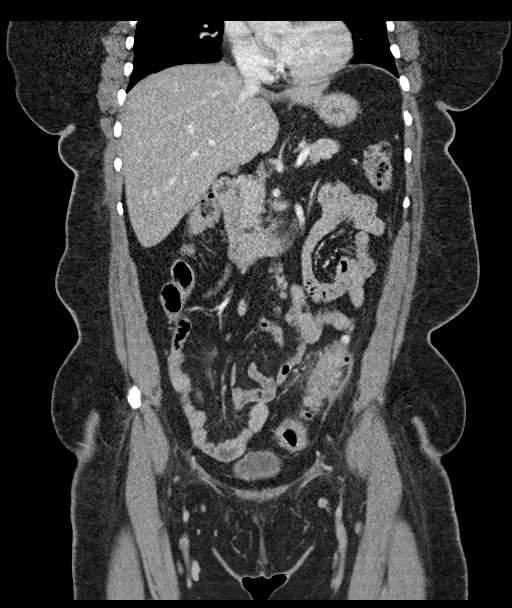
[im 69/125  soft-tissue]
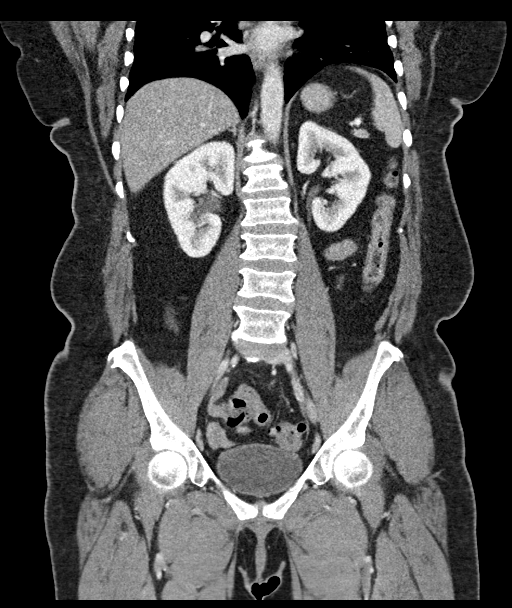

[16 of 46 positions shown; findings below may reference images not displayed]

FINDINGS: Lower chest: Right lower lobe 4 mm solid pulmonary nodule (series
4/image 4), not previously imaged.

Hepatobiliary: Normal liver size. No liver mass. Normal gallbladder
with no radiopaque cholelithiasis. No biliary ductal dilatation.

Pancreas: Normal, with no mass or duct dilation.

Spleen: Normal size. No mass.

Adrenals/Urinary Tract: Normal adrenals. Normal kidneys with no
hydronephrosis and no renal mass. Normal bladder.

Stomach/Bowel: Normal non-distended stomach. Normal caliber small
bowel with no small bowel wall thickening. Normal appendix. Moderate
left colonic diverticulosis. Segmental wall thickening with
pericolonic fat stranding and ill-defined fluid in the proximal
sigmoid colon, compatible with acute sigmoid diverticulitis. No
pericolonic free air or abscess.

Vascular/Lymphatic: Mildly atherosclerotic nonaneurysmal abdominal
aorta. Patent portal, splenic, hepatic and renal veins. No
pathologically enlarged lymph nodes in the abdomen or pelvis.

Reproductive: Status post hysterectomy, with no abnormal findings at
the vaginal cuff. No adnexal mass.

Other: No pneumoperitoneum, ascites or focal fluid collection.

Musculoskeletal: No aggressive appearing focal osseous lesions. Mild
thoracolumbar spondylosis.
IMPRESSION: 1. Acute sigmoid diverticulitis.  No free air or abscess.
2. Solid 4 mm right lower lobe pulmonary nodule. No follow-up needed
if patient is low-risk. Non-contrast chest CT can be considered in
12 months if patient is high-risk. This recommendation follows the
consensus statement: Guidelines for Management of Incidental
Pulmonary Nodules Detected on CT Images:From the [HOSPITAL]
1209; published online before print (10.1148/[id number]).
3.  Aortic Atherosclerosis ([id number]).

## 2020-03-18 ENCOUNTER — Other Ambulatory Visit: Payer: Self-pay | Admitting: Family Medicine

## 2020-03-18 DIAGNOSIS — N6320 Unspecified lump in the left breast, unspecified quadrant: Secondary | ICD-10-CM

## 2020-03-30 ENCOUNTER — Ambulatory Visit
Admission: RE | Admit: 2020-03-30 | Discharge: 2020-03-30 | Disposition: A | Discharge status: Home / Self Care | Payer: 59 | Source: Ambulatory Visit | Attending: Family Medicine | Admitting: Family Medicine

## 2020-03-30 ENCOUNTER — Other Ambulatory Visit: Payer: Self-pay | Admitting: Family Medicine

## 2020-03-30 ENCOUNTER — Other Ambulatory Visit: Payer: Self-pay

## 2020-03-30 DIAGNOSIS — N6320 Unspecified lump in the left breast, unspecified quadrant: Secondary | ICD-10-CM

## 2020-06-03 ENCOUNTER — Other Ambulatory Visit: Payer: Self-pay | Admitting: Critical Care Medicine

## 2020-06-03 ENCOUNTER — Other Ambulatory Visit: Payer: 59

## 2020-06-03 DIAGNOSIS — Z20822 Contact with and (suspected) exposure to covid-19: Secondary | ICD-10-CM

## 2020-06-05 LAB — NOVEL CORONAVIRUS, NAA: SARS-CoV-2, NAA: NOT DETECTED

## 2020-06-05 LAB — SARS-COV-2, NAA 2 DAY TAT

## 2020-07-02 ENCOUNTER — Other Ambulatory Visit: Payer: Self-pay

## 2020-07-02 ENCOUNTER — Ambulatory Visit
Admission: RE | Admit: 2020-07-02 | Discharge: 2020-07-02 | Disposition: A | Discharge status: Home / Self Care | Payer: 59 | Source: Ambulatory Visit | Attending: Family Medicine | Admitting: Family Medicine

## 2020-07-02 ENCOUNTER — Ambulatory Visit: Payer: 59

## 2020-07-02 DIAGNOSIS — N6320 Unspecified lump in the left breast, unspecified quadrant: Secondary | ICD-10-CM

## 2021-04-09 ENCOUNTER — Ambulatory Visit
Admission: RE | Admit: 2021-04-09 | Discharge: 2021-04-09 | Disposition: A | Discharge status: Home / Self Care | Payer: 59 | Source: Ambulatory Visit

## 2021-04-09 ENCOUNTER — Other Ambulatory Visit: Payer: Self-pay

## 2021-04-09 VITALS — BP 143/70 | HR 75 | Temp 98.5°F | Resp 18

## 2021-04-09 DIAGNOSIS — R202 Paresthesia of skin: Secondary | ICD-10-CM

## 2021-04-09 DIAGNOSIS — R062 Wheezing: Secondary | ICD-10-CM

## 2021-04-09 DIAGNOSIS — M7989 Other specified soft tissue disorders: Secondary | ICD-10-CM | POA: Diagnosis not present

## 2021-04-09 DIAGNOSIS — M79662 Pain in left lower leg: Secondary | ICD-10-CM | POA: Diagnosis not present

## 2021-04-09 MED ORDER — ALBUTEROL SULFATE HFA 108 (90 BASE) MCG/ACT IN AERS: 1.0000 | INHALATION_SPRAY | Freq: Four times a day (QID) | RESPIRATORY_TRACT | 0 refills | Status: DC | PRN

## 2021-04-09 MED ORDER — NAPROXEN 500 MG PO TABS: 500.0000 mg | ORAL_TABLET | Freq: Two times a day (BID) | ORAL | 0 refills | Status: DC

## 2021-04-09 NOTE — ED Triage Notes (Addendum)
Approximately 52mo of intermittent left arm and hand numbness and bilateral LE swelling that has worsened within the last month. LLE swelling greater than right.  No sob or chest pain. No h/o vascular or cardiac issues.  Few weeks of lumbar pain. Denies BLE pain and numbness and tingling. Confirms h/o DM.

## 2021-04-09 NOTE — Discharge Instructions (Addendum)
Please try and obtain compression stockings from over-the-counter for leg swelling-please get over the knee Elevate legs when resting Please go for ultrasound tomorrow-use instructions below Naprosyn as needed for arm/leg discomfort Avoid sleeping on left side Albuterol inhaler as needed for wheezing Follow-up if not improving or worsening

## 2021-04-10 ENCOUNTER — Telehealth: Payer: Self-pay | Admitting: Emergency Medicine

## 2021-04-10 ENCOUNTER — Ambulatory Visit (HOSPITAL_COMMUNITY)
Admission: RE | Admit: 2021-04-10 | Discharge: 2021-04-10 | Disposition: A | Discharge status: Home / Self Care | Payer: 59 | Source: Ambulatory Visit | Attending: Emergency Medicine | Admitting: Emergency Medicine

## 2021-04-10 DIAGNOSIS — R609 Edema, unspecified: Secondary | ICD-10-CM | POA: Insufficient documentation

## 2021-04-10 NOTE — Telephone Encounter (Signed)
Unilateral leg swelling, DVT ultrasound negative, patient requesting referal to vascular, attempted placing but encouraged to follow up with PCP

## 2021-04-10 NOTE — Progress Notes (Addendum)
Left lower extremity venous duplex completed. Refer to "CV Proc" under chart review to view preliminary results.  Note: patient endorses wheezing that is worse today than yesterday. Patients states she was given an albuterol inhaler that is at home. She has not used the inhaler today, but will use it when she gets home. Pulse ox= 97%.  Preliminary results and patient encounter discussed with [facility].  04/10/2021 11:20 AM Kelby Aline., MHA, RVT, RDCS, RDMS

## 2021-04-10 NOTE — ED Provider Notes (Signed)
[facility]    CSN: [id number] Arrival date & time: 04/09/21  1758      History   Chief Complaint Chief Complaint  Patient presents with   appointment 6pm   Leg Swelling    HPI Jessica Pace is a 60 y.o. female DM type II, hypertension, presenting today for evaluation of paresthesias to left arm as well as left lower leg swelling.  Reports over the past 4 months has had intermittent swelling to her left lower leg.  Over the past couple weeks Noted symptoms have been worse.  Symptoms initially would go away at the end of the day, but now are more persistent.  Denies associated pain.  Denies history of DVT.  Denies any tobacco use.  Denies recent travel immobilization.  Patient does report approximately 5 months prior she did have an estrogen implant into her left lower back placed by a weight loss clinic.  She denies chest pain, does have wheezing in her chest.  Also reports intermittent numbness tingling sensation throughout her entire left arm.  Notices this mainly in the evening.  Often will wake up with this sensation.  Reports that she does believe she often will sleep on her left side.  Feels symptoms throughout all 5 fingers.  Denies associated neck or shoulder pain.  HPI  Past Medical History:  Diagnosis Date   Complication of anesthesia    "takes me a long time to come out" (09/05/2016)   Hypertension    Pneumonia 1990's X 2; 2013   Type II diabetes mellitus [facility])     Patient Active Problem List   Diagnosis Date Noted   Elevated LFTs 09/13/2016   Diverticulitis 09/04/2016   History of hypothyroidism 09/01/2015   Obesity (BMI 30-39.9) 08/10/2015   Diabetes type 2, uncontrolled ([place]) 08/10/2015   Essential hypertension 08/10/2015   History of vitamin D deficiency 08/10/2015   History of headache 08/10/2015    Past Surgical History:  Procedure Laterality Date   ABDOMINAL HYSTERECTOMY  1998   CARPAL TUNNEL RELEASE Right ~ 2012   TUBAL LIGATION  1994     OB History   No obstetric history on file.      Home Medications    Prior to Admission medications   Medication Sig Start Date End Date Taking? Authorizing Provider  albuterol (VENTOLIN HFA) 108 (90 Base) MCG/ACT inhaler Inhale 1-2 puffs into the lungs every 6 (six) hours as needed for wheezing or shortness of breath. 04/09/21  Yes Kelley Polinsky C, PA-C  naproxen (NAPROSYN) 500 MG tablet Take 1 tablet (500 mg total) by mouth 2 (two) times daily. 04/09/21  Yes Heitor Steinhoff C, PA-C  Ascorbic Acid (VITAMIN C) 1000 MG tablet Take 1,000 mg by mouth daily.    [provider]  atorvastatin (LIPITOR) 40 MG tablet Take 1 tablet (40 mg total) by mouth daily. 01/18/17   Girtha Rm, NP-C  Cod Liver Oil CAPS Take 1 capsule by mouth every morning.    [provider]  GARLIC OIL PO Take 1 tablet by mouth daily.    [provider]  Glucose Blood (BLOOD GLUCOSE TEST STRIPS) STRP Test twice a day. Pt uses one touch verio meter 01/18/17   Henson, Vickie L, NP-C  HYDROcodone-acetaminophen (NORCO/VICODIN) 5-325 MG tablet Take 1 tablet by mouth every 6 (six) hours as needed. 09/07/19   Caccavale, Sophia, PA-C  insulin degludec (TRESIBA FLEXTOUCH) 100 UNIT/ML SOPN FlexTouch Pen Inject 0.5 mLs (50 Units total) into the skin daily.  And pen needles 1/day 04/30/18   Renato Shin, MD  Insulin Pen Needle (PEN NEEDLES) 32G X 4 MM MISC 1 each by Other route daily. Use with Basalgar 12/04/17   Henson, Vickie L, NP-C  levothyroxine (SYNTHROID) 75 MCG tablet Take 75 mcg by mouth every morning. 04/02/21   [provider]  metFORMIN (GLUCOPHAGE) 1000 MG tablet Take 1 tablet (1,000 mg total) by mouth 2 (two) times daily with a meal. 05/28/18   Henson, Vickie L, NP-C  NOVOLOG MIX 70/30 FLEXPEN (70-30) 100 UNIT/ML FlexPen Inject into the skin. 03/20/21   [provider]  olmesartan (BENICAR) 20 MG tablet TAKE 1 TABLET BY MOUTH EVERY DAY 06/25/18   Henson, Vickie L, NP-C  ONETOUCH  DELICA LANCETS FINE MISC Test twice a daily. Pt uses a one touch verio meter 01/18/17   Henson, Vickie L, NP-C  polyethylene glycol (MIRALAX MIX-IN PAX) 17 g packet Take 17 g by mouth daily. 09/07/19   Caccavale, Sophia, PA-C    Family History Family History  Problem Relation Age of Onset   Diabetes Mother    Hyperlipidemia Father    Hypertension Father    Heart disease Maternal Grandmother    Colon cancer Neg Hx    Stomach cancer Neg Hx    Rectal cancer Neg Hx    Esophageal cancer Neg Hx    Liver cancer Neg Hx    Breast cancer Neg Hx     Social History Social History   Tobacco Use   Smoking status: Never   Smokeless tobacco: Never  Substance Use Topics   Alcohol use: No   Drug use: No     Allergies   Dulaglutide, Metformin hcl, and Butyl acetate   Review of Systems Review of Systems  Constitutional:  Negative for fatigue and fever.  Eyes:  Negative for visual disturbance.  Respiratory:  Negative for shortness of breath.   Cardiovascular:  Positive for leg swelling. Negative for chest pain.  Gastrointestinal:  Negative for abdominal pain, nausea and vomiting.  Musculoskeletal:  Negative for arthralgias and joint swelling.  Skin:  Negative for color change, rash and wound.  Neurological:  Negative for dizziness, weakness, light-headedness and headaches.    Physical Exam Triage Vital Signs ED Triage Vitals  Enc Vitals Group     BP 04/09/21 1811 (!) 143/70     Pulse Rate 04/09/21 1811 75     Resp 04/09/21 1811 18     Temp 04/09/21 1811 98.5 F (36.9 C)     Temp Source 04/09/21 1811 Oral     SpO2 04/09/21 1811 96 %     Weight --      Height --      Head Circumference --      Peak Flow --      Pain Score 04/09/21 1814 0     Pain Loc --      Pain Edu? --      Excl. in [place]? --    No data found.  Updated Vital Signs BP (!) 143/70 (BP Location: Left Arm)   Pulse 75   Temp 98.5 F (36.9 C) (Oral)   Resp 18   SpO2 96%   Visual Acuity Right Eye  Distance:   Left Eye Distance:   Bilateral Distance:    Right Eye Near:   Left Eye Near:    Bilateral Near:     Physical Exam Vitals and nursing note reviewed.  Constitutional:      Appearance: She is  well-developed.     Comments: No acute distress  HENT:     Head: Normocephalic and atraumatic.     Nose: Nose normal.  Eyes:     Conjunctiva/sclera: Conjunctivae normal.  Cardiovascular:     Rate and Rhythm: Normal rate.  Pulmonary:     Effort: Pulmonary effort is normal. No respiratory distress.     Comments: Faint end expiratory wheezing noted through bilateral upper lung fields Abdominal:     General: There is no distension.  Musculoskeletal:        General: Normal range of motion.     Cervical back: Neck supple.     Comments: Left lower leg with 1+ pitting edema extending to knee, no overlying erythema or warmth, no specific calf tenderness, negative Homans  Left upper arm with out obvious swelling deformity or discoloration, mild tenderness to palpation in trapezius area, proximal humerus, full active range of motion of shoulder elbow and wrist, strength intact at shoulder elbow wrist and grip strength and equal to right, radial pulse 2+  Skin:    General: Skin is warm and dry.  Neurological:     Mental Status: She is alert and oriented to person, place, and time.     UC Treatments / Results  Labs (all labs ordered are listed, but only abnormal results are displayed) Labs Reviewed - No data to display  EKG   Radiology No results found.  Procedures Procedures (including critical care time)  Medications Ordered in UC Medications - No data to display  Initial Impression / Assessment and Plan / UC Course  I have reviewed the triage vital signs and the nursing notes.  Pertinent labs & imaging results that were available during my care of the patient were reviewed by me and considered in my medical decision making (see chart for details).     Left lower leg  swelling-unilateral, no associated pain, but given symptoms have been progressively worsening as well as symptoms began after placement of left lower back estrogen implant will send for ultrasound to rule out DVT.  Encouraged elevation and compression stockings in the interim Wheezing-albuterol inhaler as needed Left arm paresthesias-neurovascularly intact, suspect likely some nerve compression with the way patient is sleeping most suspicious of higher up at shoulder or neck given throughout entirety of arm, encouraged trying to avoid sleeping on the side, Naprosyn as needed for discomfort  Discussed strict return precautions. Patient verbalized understanding and is agreeable with plan.  Final Clinical Impressions(s) / UC Diagnoses   Final diagnoses:  Pain and swelling of left lower leg  Paresthesia of left arm  Wheezing     Discharge Instructions      Please try and obtain compression stockings from over-the-counter for leg swelling-please get over the knee Elevate legs when resting Please go for ultrasound tomorrow-use instructions below Naprosyn as needed for arm/leg discomfort Avoid sleeping on left side Albuterol inhaler as needed for wheezing Follow-up if not improving or worsening     ED Prescriptions     Medication Sig Dispense Auth. Provider   albuterol (VENTOLIN HFA) 108 (90 Base) MCG/ACT inhaler Inhale 1-2 puffs into the lungs every 6 (six) hours as needed for wheezing or shortness of breath. 1 each Shelina Luo C, PA-C   naproxen (NAPROSYN) 500 MG tablet Take 1 tablet (500 mg total) by mouth 2 (two) times daily. 30 tablet Dayanis Bergquist, [place] C, PA-C      PDMP not reviewed this encounter.   Janith Lima, [place] 04/10/21 [phone]

## 2021-05-24 ENCOUNTER — Encounter (HOSPITAL_COMMUNITY): Payer: 59

## 2021-06-02 ENCOUNTER — Other Ambulatory Visit: Payer: Self-pay | Admitting: *Deleted

## 2021-06-02 DIAGNOSIS — M25562 Pain in left knee: Secondary | ICD-10-CM

## 2021-06-02 DIAGNOSIS — M25561 Pain in right knee: Secondary | ICD-10-CM

## 2021-06-09 ENCOUNTER — Other Ambulatory Visit: Payer: Self-pay | Admitting: Family Medicine

## 2021-06-09 DIAGNOSIS — R0602 Shortness of breath: Secondary | ICD-10-CM

## 2021-06-09 DIAGNOSIS — R062 Wheezing: Secondary | ICD-10-CM

## 2021-06-14 ENCOUNTER — Other Ambulatory Visit: Payer: Self-pay

## 2021-06-14 ENCOUNTER — Ambulatory Visit (HOSPITAL_COMMUNITY)
Admission: RE | Admit: 2021-06-14 | Discharge: 2021-06-14 | Disposition: A | Discharge status: Home / Self Care | Payer: 59 | Source: Ambulatory Visit | Attending: Surgery | Admitting: Surgery

## 2021-06-14 ENCOUNTER — Ambulatory Visit (INDEPENDENT_AMBULATORY_CARE_PROVIDER_SITE_OTHER): Payer: 59 | Admitting: Physician Assistant

## 2021-06-14 ENCOUNTER — Encounter: Payer: Self-pay | Admitting: Physician Assistant

## 2021-06-14 VITALS — BP 134/75 | HR 78 | Temp 97.3°F | Resp 20 | Ht 64.0 in | Wt 241.0 lb

## 2021-06-14 DIAGNOSIS — M25561 Pain in right knee: Secondary | ICD-10-CM | POA: Diagnosis present

## 2021-06-14 DIAGNOSIS — R609 Edema, unspecified: Secondary | ICD-10-CM | POA: Diagnosis not present

## 2021-06-14 DIAGNOSIS — M25562 Pain in left knee: Secondary | ICD-10-CM

## 2021-06-14 DIAGNOSIS — M7989 Other specified soft tissue disorders: Secondary | ICD-10-CM

## 2021-06-14 DIAGNOSIS — I872 Venous insufficiency (chronic) (peripheral): Secondary | ICD-10-CM

## 2021-06-14 DIAGNOSIS — Z8542 Personal history of malignant neoplasm of other parts of uterus: Secondary | ICD-10-CM | POA: Insufficient documentation

## 2021-06-14 DIAGNOSIS — R911 Solitary pulmonary nodule: Secondary | ICD-10-CM | POA: Insufficient documentation

## 2021-06-14 DIAGNOSIS — E039 Hypothyroidism, unspecified: Secondary | ICD-10-CM | POA: Insufficient documentation

## 2021-06-14 NOTE — Progress Notes (Signed)
[facility] [place]   Reason for referral: Swollen left leg  History of Present Illness  Jessica Pace is a 60 y.o. female who presents with chief complaint: swollen leg.  Patient notes, onset of swelling 1 year increased about 4 months ago, associated with prolonged sitting and standing.  The patient has had no history of DVT, no history of varicose vein, no history of venous stasis ulcers, no history of  Lymphedema and no history of skin changes in lower legs.  There is no  family history of venous disorders.  The patient has  used OTC compression stockings in the past.   DM type II, hypertension, presenting today for evaluation of paresthesias to left arm as well as left lower leg swelling.     Past Medical History:  Diagnosis Date   Complication of anesthesia    "takes me a long time to come out" (09/05/2016)   Hypertension    Pneumonia 1990's X 2; 2013   Type II diabetes mellitus ([place])     Past Surgical History:  Procedure Laterality Date   ABDOMINAL HYSTERECTOMY  1998   CARPAL TUNNEL RELEASE Right ~ 2012   TUBAL LIGATION  1994    Social History   Socioeconomic History   Marital status: Married    Spouse name: Not on file   Number of children: 2   Years of education: Not on file   Highest education level: Not on file  Occupational History   Occupation: Visual merchandiser  Tobacco Use   Smoking status: Never   Smokeless tobacco: Never  Substance and Sexual Activity   Alcohol use: No   Drug use: No   Sexual activity: Not Currently    Birth control/protection: Surgical  Other Topics Concern   Not on file  Social History Narrative   Not on file   Social Determinants of Health   Financial Resource Strain: Not on file  Food Insecurity: Not on file  Transportation Needs: Not on file  Physical Activity: Not on file  Stress: Not on file  Social Connections: Not on file  Intimate Partner Violence: Not on file    Family History   Problem Relation Age of Onset   Diabetes Mother    Hyperlipidemia Father    Hypertension Father    Heart disease Maternal Grandmother    Colon cancer Neg Hx    Stomach cancer Neg Hx    Rectal cancer Neg Hx    Esophageal cancer Neg Hx    Liver cancer Neg Hx    Breast cancer Neg Hx     Current Outpatient Medications on File Prior to Visit  Medication Sig Dispense Refill   albuterol (VENTOLIN HFA) 108 (90 Base) MCG/ACT inhaler Inhale 1-2 puffs into the lungs every 6 (six) hours as needed for wheezing or shortness of breath. 1 each 0   Ascorbic Acid (VITAMIN C) 1000 MG tablet Take 1,000 mg by mouth daily.     Blood Glucose Monitoring Suppl (ACCU-CHEK GUIDE ME) w/Device KIT See admin instructions.     Cod Liver Oil CAPS Take 1 capsule by mouth every morning.     GARLIC OIL PO Take 1 tablet by mouth daily.     Glucose Blood (BLOOD GLUCOSE TEST STRIPS) STRP Test twice a day. Pt uses one touch verio meter 100 each 5   HYDROcodone-acetaminophen (NORCO/VICODIN) 5-325 MG tablet Take 1 tablet by mouth every 6 (six) hours as needed. 8 tablet 0   insulin degludec (TRESIBA FLEXTOUCH)  100 UNIT/ML SOPN FlexTouch Pen Inject 0.5 mLs (50 Units total) into the skin daily. And pen needles 1/day 5 pen 11   Insulin Pen Needle (PEN NEEDLES) 32G X 4 MM MISC 1 each by Other route daily. Use with Basalgar 30 each 0   levothyroxine (SYNTHROID) 75 MCG tablet Take 75 mcg by mouth every morning.     NOVOLOG MIX 70/30 FLEXPEN (70-30) 100 UNIT/ML FlexPen Inject into the skin.     olmesartan (BENICAR) 20 MG tablet TAKE 1 TABLET BY MOUTH EVERY DAY 90 tablet 0   ONETOUCH DELICA LANCETS FINE MISC Test twice a daily. Pt uses a one touch verio meter 100 each 5   No current facility-administered medications on file prior to visit.    Allergies as of 06/14/2021 - Review Complete 04/09/2021  Allergen Reaction Noted   Dulaglutide Other (See Comments) 05/12/2020   Metformin hcl Other (See Comments) 05/12/2020   Butyl  acetate Other (See Comments) 08/08/2012     ROS:   General:  No weight loss, Fever, chills  HEENT: No recent headaches, no nasal bleeding, no visual changes, no sore throat  Neurologic: No dizziness, blackouts, seizures. No recent symptoms of stroke or mini- stroke. No recent episodes of slurred speech, or temporary blindness.  Cardiac: No recent episodes of chest pain/pressure, no shortness of breath at rest.  No shortness of breath with exertion.  Denies history of atrial fibrillation or irregular heartbeat  Vascular: No history of rest pain in feet.  No history of claudication.  No history of non-healing ulcer, No history of DVT   Pulmonary: No home oxygen, no productive cough, no hemoptysis,  No asthma or wheezing  Musculoskeletal:  '[ ]'  Arthritis, '[ ]'  Low back pain,  '[ ]'  Joint pain  Hematologic:No history of hypercoagulable state.  No history of easy bleeding.  No history of anemia  Gastrointestinal: No hematochezia or melena,  No gastroesophageal reflux, no trouble swallowing  Urinary: '[ ]'  chronic Kidney disease, '[ ]'  on HD - '[ ]'  MWF or '[ ]'  TTHS, '[ ]'  Burning with urination, '[ ]'  Frequent urination, '[ ]'  Difficulty urinating;   Skin: No rashes  Psychological: No history of anxiety,  No history of depression  Physical Examination  Vitals:   06/14/21 1129  BP: 134/75  Pulse: 78  Resp: 20  Temp: (!) 97.3 F (36.3 C)  TempSrc: Temporal  SpO2: 96%  Weight: 241 lb (109.3 kg)  Height: '5\' 4"'  (1.626 m)    Body mass index is 41.37 kg/m.  General:  Alert and oriented, no acute distress HEENT: Normal Neck: No bruit or JVD Pulmonary: Clear to auscultation bilaterally Cardiac: Regular Rate and Rhythm without murmur Abdomen: Soft, non-tender, non-distended, no mass, no scars Skin: No rash Extremity Pulses:  2+ radial, brachial, femoral, dorsalis pedis, posterior tibial pulses bilaterally Left LE > right pitting edema  Neurologic: Upper and lower extremity motor 5/5 and  symmetric  DATA: +--------------+---------+------+-----------+------------+--------+  LEFT          Reflux NoRefluxReflux TimeDiameter cmsComments                          Yes                                   +--------------+---------+------+-----------+------------+--------+  CFV           no                                              +--------------+---------+------+-----------+------------+--------+  FV mid        no                                              +--------------+---------+------+-----------+------------+--------+  Popliteal     no                                              +--------------+---------+------+-----------+------------+--------+  GSV at SFJ              yes    >500 ms      0.64              +--------------+---------+------+-----------+------------+--------+  GSV prox thighno                            0.42              +--------------+---------+------+-----------+------------+--------+  GSV mid thigh no                            0.39              +--------------+---------+------+-----------+------------+--------+  GSV dist thighno                            0.38              +--------------+---------+------+-----------+------------+--------+  GSV at knee   no                            0.34              +--------------+---------+------+-----------+------------+--------+  GSV prox calf no                            0.31              +--------------+---------+------+-----------+------------+--------+  SSV Pop Fossa no                            0.38              +--------------+---------+------+-----------+------------+--------+  SSV prox calf no                            0.24              +--------------+---------+------+-----------+------------+--------+  SSV mid calf  no                            0.21               +--------------+---------+------+-----------+------------+--------+    Summary:  Left:  - No evidence of deep vein thrombosis seen in the left lower extremity,  from the common femoral through the popliteal veins.  - No evidence of superficial venous thrombosis in the left lower  extremity.     - Venous reflux is noted in the left sapheno-femoral junction.     Assessment/Plan:  Left > right LE edema without venous  skin changes The venous duplex shows reflux in the SFJ only and the GSV is not enlarged.  She has palpable pedal pulses B and is not at risk of limb loss.    She was measured and fitted for 15-20 mm hg knee and thigh high compression.  Elevation while at rest, walk for exercise avoid prolonged sitting and standing.  Water therapy if available.  F/U PRN.     Roxy Horseman PA-C [facility] Office: [phone]  MD in clinic [place]

## 2021-07-30 ENCOUNTER — Other Ambulatory Visit: Payer: Self-pay

## 2021-07-30 ENCOUNTER — Inpatient Hospital Stay: Admission: RE | Admit: 2021-07-30 | Payer: 59 | Source: Ambulatory Visit

## 2021-07-30 ENCOUNTER — Ambulatory Visit (HOSPITAL_COMMUNITY)
Admission: EM | Admit: 2021-07-30 | Discharge: 2021-07-30 | Disposition: A | Discharge status: Home / Self Care | Payer: 59

## 2021-07-30 ENCOUNTER — Encounter (HOSPITAL_COMMUNITY): Payer: Self-pay

## 2021-07-30 ENCOUNTER — Ambulatory Visit (INDEPENDENT_AMBULATORY_CARE_PROVIDER_SITE_OTHER): Payer: 59

## 2021-07-30 DIAGNOSIS — E118 Type 2 diabetes mellitus with unspecified complications: Secondary | ICD-10-CM | POA: Diagnosis not present

## 2021-07-30 DIAGNOSIS — J22 Unspecified acute lower respiratory infection: Secondary | ICD-10-CM

## 2021-07-30 DIAGNOSIS — R062 Wheezing: Secondary | ICD-10-CM | POA: Diagnosis not present

## 2021-07-30 DIAGNOSIS — R0989 Other specified symptoms and signs involving the circulatory and respiratory systems: Secondary | ICD-10-CM

## 2021-07-30 DIAGNOSIS — R051 Acute cough: Secondary | ICD-10-CM

## 2021-07-30 DIAGNOSIS — Z794 Long term (current) use of insulin: Secondary | ICD-10-CM | POA: Diagnosis not present

## 2021-07-30 DIAGNOSIS — E119 Type 2 diabetes mellitus without complications: Secondary | ICD-10-CM

## 2021-07-30 MED ORDER — BENZONATATE 100 MG PO CAPS: 100.0000 mg | ORAL_CAPSULE | Freq: Three times a day (TID) | ORAL | 0 refills | Status: DC | PRN

## 2021-07-30 MED ORDER — AMOXICILLIN-POT CLAVULANATE 875-125 MG PO TABS: 1.0000 | ORAL_TABLET | Freq: Two times a day (BID) | ORAL | 0 refills | Status: DC

## 2021-07-30 MED ORDER — PROMETHAZINE-DM 6.25-15 MG/5ML PO SYRP: 5.0000 mL | ORAL_SOLUTION | Freq: Every evening | ORAL | 0 refills | Status: DC | PRN

## 2021-07-30 NOTE — ED Triage Notes (Signed)
Pt reports fever, wheezing, nasal con chest congestion x 2 week. Mucinex, albuterol and benzonatate, gives no relief.

## 2021-07-30 NOTE — Discharge Instructions (Addendum)
We will manage this as a lower respiratory infection. The antibiotic I am using Augmentin will address both pneumonia and sinus infection. I will you to discuss any abnormal results from your chest x-ray. Otherwise, if there is no abnormal findings, you will not hear from me today. For sore throat or cough try using a honey-based tea. Use 3 teaspoons of honey with juice squeezed from half lemon. Place shaved pieces of ginger into 1/2-1 cup of water and warm over stove top. Then mix the ingredients and repeat every 4 hours as needed. Please take ibuprofen 600mg  every 6 hours with food alternating with OR taken together with Tylenol 500mg -650mg  every 6 hours for throat pain, fevers, aches and pains. Hydrate very well with at least 2 liters of water. Eat light meals such as soups (chicken and noodles, vegetable, chicken and wild rice).  Do not eat foods that you are allergic to.  Taking an antihistamine like Zyrtec can help against postnasal drainage, sinus congestion.  You can take this together with pseudoephedrine (Sudafed) at a dose of 30-60 mg 3 times a day or twice daily as needed for the same kind of nasal drip, congestion.

## 2021-07-30 NOTE — ED Provider Notes (Signed)
[facility]   MRN: [id number] DOB: [birth year]  Subjective:   Jessica Pace is a 60 y.o. female presenting for 2 week history of persistent cough, chest congestion, wheezing, malaise, fatigue, sinus congestion, hoarseness from coughing. Denies history of asthma but has been using an albuterol inhaler without any relief. Also using Mucinex, benzonatate. No smoking. Has a history of pneumonia.   No current facility-administered medications for this encounter.  Current Outpatient Medications:    benzonatate (TESSALON) 100 MG capsule, Take by mouth 3 (three) times daily as needed for cough., Disp: , Rfl:    dextromethorphan-guaiFENesin (MUCINEX DM) 30-600 MG 12hr tablet, Take 1 tablet by mouth 2 (two) times daily., Disp: , Rfl:    albuterol (VENTOLIN HFA) 108 (90 Base) MCG/ACT inhaler, Inhale 1-2 puffs into the lungs every 6 (six) hours as needed for wheezing or shortness of breath., Disp: 1 each, Rfl: 0   Ascorbic Acid (VITAMIN C) 1000 MG tablet, Take 1,000 mg by mouth daily., Disp: , Rfl:    Blood Glucose Monitoring Suppl (ACCU-CHEK GUIDE ME) w/Device KIT, See admin instructions., Disp: , Rfl:    Cod Liver Oil CAPS, Take 1 capsule by mouth every morning., Disp: , Rfl:    GARLIC OIL PO, Take 1 tablet by mouth daily., Disp: , Rfl:    Glucose Blood (BLOOD GLUCOSE TEST STRIPS) STRP, Test twice a day. Pt uses one touch verio meter, Disp: 100 each, Rfl: 5   HYDROcodone-acetaminophen (NORCO/VICODIN) 5-325 MG tablet, Take 1 tablet by mouth every 6 (six) hours as needed., Disp: 8 tablet, Rfl: 0   insulin degludec (TRESIBA FLEXTOUCH) 100 UNIT/ML SOPN FlexTouch Pen, Inject 0.5 mLs (50 Units total) into the skin daily. And pen needles 1/day, Disp: 5 pen, Rfl: 11   Insulin Pen Needle (PEN NEEDLES) 32G X 4 MM MISC, 1 each by Other route daily. Use with Basalgar, Disp: 30 each, Rfl: 0   levothyroxine (SYNTHROID) 75 MCG tablet, Take 75 mcg by mouth every morning., Disp: , Rfl:    NOVOLOG  MIX 70/30 FLEXPEN (70-30) 100 UNIT/ML FlexPen, Inject into the skin., Disp: , Rfl:    olmesartan (BENICAR) 20 MG tablet, TAKE 1 TABLET BY MOUTH EVERY DAY, Disp: 90 tablet, Rfl: 0   ONETOUCH DELICA LANCETS FINE MISC, Test twice a daily. Pt uses a one touch verio meter, Disp: 100 each, Rfl: 5   Allergies  Allergen Reactions   Canagliflozin     Other reaction(s): yeast   Dulaglutide Other (See Comments)   Insulin Degludec Other (See Comments)   Metformin Hcl Other (See Comments)   Butyl Acetate Other (See Comments)    In paints and nail polish- unknown    Past Medical History:  Diagnosis Date   Complication of anesthesia    "takes me a long time to come out" (09/05/2016)   Hypertension    Pneumonia 1990's X 2; 2013   Type II diabetes mellitus ([place])      Past Surgical History:  Procedure Laterality Date   ABDOMINAL HYSTERECTOMY  1998   CARPAL TUNNEL RELEASE Right ~ 2012   TUBAL LIGATION  1994    Family History  Problem Relation Age of Onset   Diabetes Mother    Hyperlipidemia Father    Hypertension Father    Heart disease Maternal Grandmother    Colon cancer Neg Hx    Stomach cancer Neg Hx    Rectal cancer Neg Hx    Esophageal cancer Neg Hx    Liver  cancer Neg Hx    Breast cancer Neg Hx     Social History   Tobacco Use   Smoking status: Never   Smokeless tobacco: Never  Substance Use Topics   Alcohol use: No   Drug use: No    ROS   Objective:   Vitals: BP (!) 141/67 (BP Location: Left Arm)   Pulse 85   Temp 99.1 F (37.3 C) (Oral)   Resp (!) 22   SpO2 100%   Physical Exam Constitutional:      General: She is not in acute distress.    Appearance: Normal appearance. She is well-developed. She is obese. She is not ill-appearing, toxic-appearing or diaphoretic.  HENT:     Head: Normocephalic and atraumatic.     Right Ear: External ear normal.     Left Ear: External ear normal.     Nose: Nose normal.     Mouth/Throat:     Mouth: Mucous membranes  are moist.     Pharynx: No oropharyngeal exudate or posterior oropharyngeal erythema.  Eyes:     General: No scleral icterus.       Right eye: No discharge.        Left eye: No discharge.     Extraocular Movements: Extraocular movements intact.     Conjunctiva/sclera: Conjunctivae normal.     Pupils: Pupils are equal, round, and reactive to light.  Cardiovascular:     Rate and Rhythm: Normal rate and regular rhythm.     Pulses: Normal pulses.     Heart sounds: Normal heart sounds. No murmur heard.   No friction rub. No gallop.  Pulmonary:     Effort: Pulmonary effort is normal. No respiratory distress.     Breath sounds: No stridor. No wheezing, rhonchi or rales.  Musculoskeletal:     Cervical back: Normal range of motion and neck supple.  Lymphadenopathy:     Cervical: No cervical adenopathy.  Skin:    General: Skin is warm and dry.     Findings: No rash.  Neurological:     Mental Status: She is alert and oriented to person, place, and time.  Psychiatric:        Mood and Affect: Mood normal.        Behavior: Behavior normal.        Thought Content: Thought content normal.     Assessment and Plan :   PDMP not reviewed this encounter.  1. Lower respiratory infection   2. Chest congestion   3. Acute cough   4. Type 2 diabetes mellitus treated with insulin ([place])     Will cover for lower respiratory infection with Augmentin. X-ray over-read was pending at time of discharge, recommended follow up with only abnormal results. Otherwise will not call for negative over-read. Patient was in agreement. Use supportive care otherwise. Deferred prednisone course given severely uncontrolled diabetes. Counseled patient on potential for adverse effects with medications prescribed/recommended today, ER and return-to-clinic precautions discussed, patient verbalized understanding.    Jaynee Eagles, PA-C 07/30/21 1102

## 2021-10-08 ENCOUNTER — Ambulatory Visit: Payer: 59 | Admitting: Podiatry

## 2021-10-22 ENCOUNTER — Ambulatory Visit (INDEPENDENT_AMBULATORY_CARE_PROVIDER_SITE_OTHER): Payer: 59 | Admitting: Podiatry

## 2021-10-22 ENCOUNTER — Other Ambulatory Visit: Payer: Self-pay

## 2021-10-22 DIAGNOSIS — B351 Tinea unguium: Secondary | ICD-10-CM | POA: Diagnosis not present

## 2021-10-22 DIAGNOSIS — L603 Nail dystrophy: Secondary | ICD-10-CM

## 2021-10-25 NOTE — Progress Notes (Signed)
°  Subjective:  Patient ID: Jessica Pace, female    DOB: [birth year],  MRN: [id number]  Chief Complaint  Patient presents with   Diabetes     (NP) Diabetic-Right foot great toenail fungus-req dr. Annslee Tercero    61 y.o. female presents with the above complaint. History confirmed with patient. Reports discoloration to right toes 1,2,3,5. Reports callus to both feet. Wears safety shoes at work and wondering if this is cause of her nail thickening  PCP soon to be Dr. Thom Chimes. AM Bs was 164 this AM. Reports pain in the toenails rated 2-3/10 Objective:  Physical Exam: warm, good capillary refill, no trophic changes or ulcerative lesions, normal DP and PT pulses, and normal sensory exam. Mild dystrophic changes to nails bilat, evidence of horizontal ridging. Assessment:   1. Onychomycosis   2. Nail dystrophy     Plan:  Patient was evaluated and treated and all questions answered.  Onychomycosis/Onychodystrophy -Educated on etiology of nail fungus. -Nail sample taken for microbiology and histology. -Debrided nails, courtesy. -Will f/u results and consider options for patient.  No follow-ups on file.

## 2021-10-26 ENCOUNTER — Telehealth: Payer: Self-pay | Admitting: *Deleted

## 2021-10-26 ENCOUNTER — Other Ambulatory Visit: Payer: Self-pay | Admitting: Podiatry

## 2021-10-26 MED ORDER — FLUCONAZOLE 150 MG PO TABS: 150.0000 mg | ORAL_TABLET | ORAL | 2 refills | Status: DC

## 2021-10-26 NOTE — Progress Notes (Signed)
Rx fluconazole weekly.

## 2021-10-26 NOTE — Telephone Encounter (Signed)
Returned the call to patient, no answer, could not leave vmessage,mailbox full.

## 2021-10-26 NOTE — Telephone Encounter (Signed)
Patient is calling because she that it was mentioned a prescription was supposed to be sent into pharmacy. I do not see anything in notes, could not reach patient for clarification. Please advise.

## 2021-11-29 ENCOUNTER — Encounter: Payer: Self-pay | Admitting: Internal Medicine

## 2021-11-29 ENCOUNTER — Ambulatory Visit: Payer: 59 | Admitting: Internal Medicine

## 2021-11-29 ENCOUNTER — Other Ambulatory Visit: Payer: Self-pay

## 2021-11-29 ENCOUNTER — Ambulatory Visit: Payer: 59 | Attending: Internal Medicine | Admitting: Internal Medicine

## 2021-11-29 DIAGNOSIS — Z6841 Body Mass Index (BMI) 40.0 and over, adult: Secondary | ICD-10-CM

## 2021-11-29 DIAGNOSIS — N3946 Mixed incontinence: Secondary | ICD-10-CM | POA: Insufficient documentation

## 2021-11-29 DIAGNOSIS — R600 Localized edema: Secondary | ICD-10-CM

## 2021-11-29 DIAGNOSIS — Z23 Encounter for immunization: Secondary | ICD-10-CM

## 2021-11-29 DIAGNOSIS — E1169 Type 2 diabetes mellitus with other specified complication: Secondary | ICD-10-CM | POA: Diagnosis not present

## 2021-11-29 DIAGNOSIS — R062 Wheezing: Secondary | ICD-10-CM | POA: Diagnosis not present

## 2021-11-29 DIAGNOSIS — R0609 Other forms of dyspnea: Secondary | ICD-10-CM | POA: Diagnosis not present

## 2021-11-29 DIAGNOSIS — Z8542 Personal history of malignant neoplasm of other parts of uterus: Secondary | ICD-10-CM

## 2021-11-29 DIAGNOSIS — I10 Essential (primary) hypertension: Secondary | ICD-10-CM

## 2021-11-29 DIAGNOSIS — E039 Hypothyroidism, unspecified: Secondary | ICD-10-CM

## 2021-11-29 LAB — POCT GLYCOSYLATED HEMOGLOBIN (HGB A1C): HbA1c, POC (controlled diabetic range): 9.3 % — AB (ref 0.0–7.0)

## 2021-11-29 LAB — GLUCOSE, POCT (MANUAL RESULT ENTRY): POC Glucose: 109 mg/dl — AB (ref 70–99)

## 2021-11-29 MED ORDER — ALBUTEROL SULFATE HFA 108 (90 BASE) MCG/ACT IN AERS: 2.0000 | INHALATION_SPRAY | Freq: Four times a day (QID) | RESPIRATORY_TRACT | 2 refills | Status: DC | PRN

## 2021-11-29 MED ORDER — NOVOLOG MIX 70/30 FLEXPEN (70-30) 100 UNIT/ML ~~LOC~~ SUPN: PEN_INJECTOR | SUBCUTANEOUS | 6 refills | Status: DC

## 2021-11-29 MED ORDER — OLMESARTAN MEDOXOMIL 20 MG PO TABS: 20.0000 mg | ORAL_TABLET | Freq: Every day | ORAL | 2 refills | Status: DC

## 2021-11-29 MED ORDER — HYDROCHLOROTHIAZIDE 12.5 MG PO TABS: 12.5000 mg | ORAL_TABLET | Freq: Every day | ORAL | 6 refills | Status: DC

## 2021-11-29 MED ORDER — SOLIFENACIN SUCCINATE 5 MG PO TABS: 5.0000 mg | ORAL_TABLET | Freq: Every day | ORAL | 4 refills | Status: DC

## 2021-11-29 NOTE — Patient Instructions (Signed)
Check your blood pressure 2 times a week.  Goal is 130/80 or lower.  We have added Hydrochlorothiazide 12.5 mg daily to the Benicar.  Increase your insulin to 64/34/64 units as discussed today.   Diabetes Mellitus and Nutrition, Adult When you have diabetes, or diabetes mellitus, it is very important to have healthy eating habits because your blood sugar (glucose) levels are greatly affected by what you eat and drink. Eating healthy foods in the right amounts, at about the same times every day, can help you: Manage your blood glucose. Lower your risk of heart disease. Improve your blood pressure. Reach or maintain a healthy weight. What can affect my meal plan? Every person with diabetes is different, and each person has different needs for a meal plan. Your health care provider may recommend that you work with a dietitian to make a meal plan that is best for you. Your meal plan may vary depending on factors such as: The calories you need. The medicines you take. Your weight. Your blood glucose, blood pressure, and cholesterol levels. Your activity level. Other health conditions you have, such as heart or kidney disease. How do carbohydrates affect me? Carbohydrates, also called carbs, affect your blood glucose level more than any other type of food. Eating carbs raises the amount of glucose in your blood. It is important to know how many carbs you can safely have in each meal. This is different for every person. Your dietitian can help you calculate how many carbs you should have at each meal and for each snack. How does alcohol affect me? Alcohol can cause a decrease in blood glucose (hypoglycemia), especially if you use insulin or take certain diabetes medicines by mouth. Hypoglycemia can be a life-threatening condition. Symptoms of hypoglycemia, such as sleepiness, dizziness, and confusion, are similar to symptoms of having too much alcohol. Do not drink alcohol if: Your health care  provider tells you not to drink. You are pregnant, may be pregnant, or are planning to become pregnant. If you drink alcohol: Limit how much you have to: 0-1 drink a day for women. 0-2 drinks a day for men. Know how much alcohol is in your drink. In the [place] one drink equals one 12 oz bottle of beer (355 mL), one 5 oz glass of wine (148 mL), or one 1 oz glass of hard liquor (44 mL). Keep yourself hydrated with water, diet soda, or unsweetened iced tea. Keep in mind that regular soda, juice, and other mixers may contain a lot of sugar and must be counted as carbs. What are tips for following this plan? Reading food labels Start by checking the serving size on the Nutrition Facts label of packaged foods and drinks. The number of calories and the amount of carbs, fats, and other nutrients listed on the label are based on one serving of the item. Many items contain more than one serving per package. Check the total grams (g) of carbs in one serving. Check the number of grams of saturated fats and trans fats in one serving. Choose foods that have a low amount or none of these fats. Check the number of milligrams (mg) of salt (sodium) in one serving. Most people should limit total sodium intake to less than 2,300 mg per day. Always check the nutrition information of foods labeled as "low-fat" or "nonfat." These foods may be higher in added sugar or refined carbs and should be avoided. Talk to your dietitian to identify your daily goals for nutrients listed  on the label. Shopping Avoid buying canned, pre-made, or processed foods. These foods tend to be high in fat, sodium, and added sugar. Shop around the outside edge of the grocery store. This is where you will most often find fresh fruits and vegetables, bulk grains, fresh meats, and fresh dairy products. Cooking Use low-heat cooking methods, such as baking, instead of high-heat cooking methods, such as deep frying. Cook using healthy oils, such as  olive, canola, or sunflower oil. Avoid cooking with butter, cream, or high-fat meats. Meal planning Eat meals and snacks regularly, preferably at the same times every day. Avoid going long periods of time without eating. Eat foods that are high in fiber, such as fresh fruits, vegetables, beans, and whole grains. Eat 4-6 oz (112-168 g) of lean protein each day, such as lean meat, chicken, fish, eggs, or tofu. One ounce (oz) (28 g) of lean protein is equal to: 1 oz (28 g) of meat, chicken, or fish. 1 egg.  cup (62 g) of tofu. Eat some foods each day that contain healthy fats, such as avocado, nuts, seeds, and fish. What foods should I eat? Fruits Berries. Apples. Oranges. Peaches. Apricots. Plums. Grapes. Mangoes. Papayas. Pomegranates. Kiwi. Cherries. Vegetables Leafy greens, including lettuce, spinach, kale, chard, collard greens, mustard greens, and cabbage. Beets. Cauliflower. Broccoli. Carrots. Green beans. Tomatoes. Peppers. Onions. Cucumbers. Brussels sprouts. Grains Whole grains, such as whole-wheat or whole-grain bread, crackers, tortillas, cereal, and pasta. Unsweetened oatmeal. Quinoa. Brown or wild rice. Meats and other proteins Seafood. Poultry without skin. Lean cuts of poultry and beef. Tofu. Nuts. Seeds. Dairy Low-fat or fat-free dairy products such as milk, yogurt, and cheese. The items listed above may not be a complete list of foods and beverages you can eat and drink. Contact a dietitian for more information. What foods should I avoid? Fruits Fruits canned with syrup. Vegetables Canned vegetables. Frozen vegetables with butter or cream sauce. Grains Refined white flour and flour products such as bread, pasta, snack foods, and cereals. Avoid all processed foods. Meats and other proteins Fatty cuts of meat. Poultry with skin. Breaded or fried meats. Processed meat. Avoid saturated fats. Dairy Full-fat yogurt, cheese, or milk. Beverages Sweetened drinks, such as soda  or iced tea. The items listed above may not be a complete list of foods and beverages you should avoid. Contact a dietitian for more information. Questions to ask a health care provider Do I need to meet with a certified diabetes care and education specialist? Do I need to meet with a dietitian? What number can I call if I have questions? When are the best times to check my blood glucose? Where to find more information: American Diabetes Association: diabetes.org Academy of Nutrition and Dietetics: eatright.[employer] of Diabetes and Digestive and Kidney Diseases: [employer] Association of Diabetes Care & Education Specialists: diabeteseducator.org Summary It is important to have healthy eating habits because your blood sugar (glucose) levels are greatly affected by what you eat and drink. It is important to use alcohol carefully. A healthy meal plan will help you manage your blood glucose and lower your risk of heart disease. Your health care provider may recommend that you work with a dietitian to make a meal plan that is best for you. This information is not intended to replace advice given to you by your health care provider. Make sure you discuss any questions you have with your health care provider. Document Revised: 05/06/2020 Document Reviewed: 05/06/2020 Elsevier Patient Education  [employer].

## 2021-11-29 NOTE — Progress Notes (Signed)
Patient ID: Jessica Pace, female    DOB: 04-17-1961  MRN: [id number]  CC: New Patient (Initial Visit), Diabetes (Cbg-109/A1C-9.3), Hypertension, Shoulder Pain (Left ), Wheezing, and Edema (Left hand, arm and leg )   Subjective: Jessica Pace is a 61 y.o. female who presents for new pt visit Her concerns today include:  Patient with history of DM type II, HTN, hypothyroid, obesity, uterine CA (s/p hysterectomy 1998), vitamin D deficiency, venous reflux  Previous PCP was Dr. Janie Pace with [facility].  Last seen 04/2021.  Decided to change because she had a difficult time getting in to see her when she needed to be seen  C/o intermittent wheezing since having COVID in 2021 and getting worse.  She was on albuterol inhaler before when she was diagnosed with COVID.  No known history of asthma. Dx with pneumonia 07/2021.  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 CT chest was ordered 05/2021 but was not done.  Incidental finding of 4 mm nodule in RLL lung seen on CAT scan of the abdomen done 09/07/2019. Feels she is retaining fluid. Endorses swelling in LT leg and arm.  Saw vascular for left greater than right lower extremity edema 05/2021.  dx with venous reflux.  She was measured and fitted for compression socks.  She wears them consistently but does not have them on today.   No PND, sleep on 2 pillows Has appt with Dr. Oval Pace 12/16/2021 for BP and to eval her heart.  Referred by her RN with [facility].    Problems holding urine.  When she gets urge to urinate, she has to go right away otherwise she has leakage.  This has been going on since 07/2021.  Also endorses leakage of urine with coughing or laughing.    DM/Obesity:    A1C 9.4/BS 109 On Novlog 70/30  60units a.m/30 noon/60 p.m Does not tolerate Metformin (causes diarrhea), Trulicity caused diverticulitis, SGLT caused bad yeast infection Would like to get back on Lantus insulin.  States she did well with that.  She feels the NovoLog 70/30 is not working as well.  She used to see Dr. Loanne Drilling but does not want to see him anymore.  She then saw Dr. Soyla Murphy for a while.  Last seen by her 05/2021.  She had placed her on the NovoLog 70/30. Checking BS BID - before BF and 2 hrs after dinner.  Before BF range 130-200, bedtime 300-350. "I don't eat a lot of junk and I do not drink soft drinks at all."  Eats a lot of veggies.  Tries not to eat a lot of fried foods.  Last saw nutritionist about 2 yrs ago.   Not getting in much exercise Last eye exam 11/09/2021 at [facility] in [place]  HTN:  on Benicar 20 mg.  Took already today Has home device but has not been checking Limits salt in foods No CP Has appt with Dr. Oval Pace 12/16/2021 for BP and to eval her heart.  Referred by her RN with [facility].   She is not aware of history of HL even though it is on her chart.  Hypothyroid:  on Levothyroxine 75 mcg.  She takes medication consistently every Pace before taking any of her  other medications.    HM:  last pap about 2 yrs ago by her PCP.  History of uterine cancer that was treated with hysterectomy back in 1998. Patient Active Problem List   Diagnosis Date Noted   Mixed incontinence 11/29/2021   History of cancer of uterine body 06/14/2021   Hypothyroidism 06/14/2021   Pulmonary nodule 06/14/2021   Elevated LFTs 09/13/2016   Diverticulitis 09/04/2016   Type 2 diabetes mellitus with morbid obesity ([place]) 11/02/2015   History of hypothyroidism 09/01/2015   Obesity (BMI 30-39.9) 08/10/2015   Diabetes type 2, uncontrolled 08/10/2015   Essential hypertension 08/10/2015   History of vitamin D deficiency 08/10/2015   History of headache 08/10/2015   Uterine cancer ([place]) 04/07/2012   Mixed  hyperlipidemia 02/28/2012   Vitamin D deficiency 02/28/2012     Current Outpatient Medications on File Prior to Visit  Medication Sig Dispense Refill   Ascorbic Acid (VITAMIN C) 1000 MG tablet Take 1,000 mg by mouth daily.     Blood Glucose Monitoring Suppl (ACCU-CHEK GUIDE ME) w/Device KIT See admin instructions.     Cod Liver Oil CAPS Take 1 capsule by mouth every Pace.     GARLIC OIL PO Take 1 tablet by mouth daily.     Glucose Blood (BLOOD GLUCOSE TEST STRIPS) STRP Test twice a day. Pt uses one touch verio meter 100 each 5   insulin degludec (TRESIBA FLEXTOUCH) 100 UNIT/ML SOPN FlexTouch Pen Inject 0.5 mLs (50 Units total) into the skin daily. And pen needles 1/day 5 pen 11   Insulin Pen Needle (PEN NEEDLES) 32G X 4 MM MISC 1 each by Other route daily. Use with Basalgar 30 each 0   levothyroxine (SYNTHROID) 75 MCG tablet Take 75 mcg by mouth every Pace.     ONETOUCH DELICA LANCETS FINE MISC Test twice a daily. Pt uses a one touch verio meter 100 each 5   No current facility-administered medications on file prior to visit.    Allergies  Allergen Reactions   Canagliflozin     Other reaction(s): yeast   Dulaglutide Other (See Comments)   Insulin Degludec Other (See Comments)   Metformin Hcl Other (See Comments)   Butyl Acetate (N-Butyl Acetate) Other (See Comments)    In paints and nail polish- unknown    Social History   Socioeconomic History   Marital status: Legally Separated    Spouse name: Not on file   Number of children: 2   Years of education: Not on file   Highest education level: Not on file  Occupational History   Occupation: Visual merchandiser  Tobacco Use   Smoking status: Never   Smokeless tobacco: Never  Substance and Sexual Activity   Alcohol use: No   Drug use: No   Sexual activity: Not Currently    Birth control/protection: Surgical  Other Topics Concern   Not on file  Social History Narrative   Not on file   Social Determinants of  Health   Financial Resource Strain: Not on file  Food Insecurity: Not on file  Transportation Needs: Not on file  Physical Activity: Not on file  Stress: Not on file  Social Connections: Not on file  Intimate Partner Violence: Not on file    Family History  Problem Relation Age of Onset   Diabetes Mother    Hyperlipidemia Father    Hypertension Father    Heart disease Maternal Grandmother    Colon cancer Neg Hx    Stomach cancer Neg Hx  Rectal cancer Neg Hx    Esophageal cancer Neg Hx    Liver cancer Neg Hx    Breast cancer Neg Hx     Past Surgical History:  Procedure Laterality Date   ABDOMINAL HYSTERECTOMY  1998   CARPAL TUNNEL RELEASE Right ~ 2012   TUBAL LIGATION  1994    ROS: Review of Systems Negative except as stated above  PHYSICAL EXAM: BP (!) 162/81    Pulse 77    Resp 16    Ht '5\' 4"'  (1.626 m)    Wt 247 lb 12.8 oz (112.4 kg)    SpO2 98%    BMI 42.53 kg/m   Wt Readings from Last 3 Encounters:  11/29/21 247 lb 12.8 oz (112.4 kg)  06/14/21 241 lb (109.3 kg)  09/07/19 198 lb (89.8 kg)  Repeat BP 175/77  Physical Exam  General appearance - alert, well appearing, older African-American female and in no distress Mental status - normal mood, behavior, speech, dress, motor activity, and thought processes Eyes -slightly pale conjunctiva Nose - normal and patent, no erythema, discharge or polyps Mouth - mucous membranes moist, pharynx normal without lesions Neck - supple, no significant adenopathy Chest - clear to auscultation, no wheezes, rales or rhonchi, symmetric air entry Heart - normal rate, regular rhythm, normal S1, S2, no murmurs, rubs, clicks or gallops Extremities -trace to 1+ bilateral lower extremity edema with left slightly greater than right. Diabetic Foot Exam - Simple   Simple Foot Form Visual Inspection See comments: Yes Sensation Testing Intact to touch and monofilament testing bilaterally: Yes Pulse Check Posterior Tibialis and  Dorsalis pulse intact bilaterally: Yes Comments Toenail on RT big toe is thick, chipped and discolored.  Small corns on both 2nd toes.  Small callous on plantar surface LT foot laterally      CMP Latest Ref Rng & Units 11/29/2021 09/07/2019 05/28/2018  Glucose 70 - 99 mg/dL 71 304(H) 253(H)  BUN 8 - 27 mg/dL '13 11 17  ' Creatinine 0.57 - 1.00 mg/dL 0.83 0.78 0.81  Sodium 134 - 144 mmol/L 142 131(L) 136  Potassium 3.5 - 5.2 mmol/L 4.3 4.4 4.8  Chloride 96 - 106 mmol/L 103 98 102  CO2 20 - 29 mmol/L '26 22 20  ' Calcium 8.7 - 10.3 mg/dL 9.7 9.0 9.8  Total Protein 6.0 - 8.5 g/dL 7.5 7.5 7.8  Total Bilirubin 0.0 - 1.2 mg/dL 0.4 1.0 0.3  Alkaline Phos 44 - 121 IU/L 107 81 84  AST 0 - 40 IU/L 44(H) 25 23  ALT 0 - 32 IU/L 74(H) 34 39(H)   Lipid Panel     Component Value Date/Time   CHOL 177 11/29/2021 1146   TRIG 133 11/29/2021 1146   HDL 44 11/29/2021 1146   CHOLHDL 4.0 11/29/2021 1146   CHOLHDL 7.8 (H) 01/18/2017 1019   VLDL 72 (H) 01/18/2017 1019   LDLCALC 109 (H) 11/29/2021 1146    CBC    Component Value Date/Time   WBC 6.2 11/29/2021 1146   WBC 6.3 09/07/2019 1037   RBC 4.48 11/29/2021 1146   RBC 4.60 09/07/2019 1037   HGB 12.7 11/29/2021 1146   HCT 38.1 11/29/2021 1146   PLT 213 11/29/2021 1146   MCV 85 11/29/2021 1146   MCH 28.3 11/29/2021 1146   MCH 28.7 09/07/2019 1037   MCHC 33.3 11/29/2021 1146   MCHC 33.2 09/07/2019 1037   RDW 14.7 11/29/2021 1146   LYMPHSABS 1.7 09/07/2019 1037   LYMPHSABS 2.2 05/28/2018 1539  MONOABS 0.4 09/07/2019 1037   EOSABS 0.1 09/07/2019 1037   EOSABS 0.1 05/28/2018 1539   BASOSABS 0.0 09/07/2019 1037   BASOSABS 0.0 05/28/2018 1539    ASSESSMENT AND PLAN: 1. Type 2 diabetes mellitus with morbid obesity (HCC) Not at goal.  She is intolerant of metformin, SGLT and Trulicity.  I told patient that if we changed to Lantus we would need to add mealtime insulin which means the total number of times that she would be injecting herself would  go from 3x/day to 4x/day.  I recommend that she continues NovoLog 70/30 until we get her in with a new endocrinologist.  However increase the dose to 64 units with breakfast, 34 units with lunch and 64 units with dinner.  Went over blood sugar goals before and 2 hours after meals. -Encourage exercise as tolerated.  However I advised against vigorous exercise until she is evaluated by pulmonary and cardiology. Dietary counseling given.  She is agreeable to seeing a nutritionist. - POCT glucose (manual entry) - POCT glycosylated hemoglobin (Hb A1C) - Microalbumin / creatinine urine ratio - Ambulatory referral to Endocrinology - CBC - Comprehensive metabolic panel - Lipid panel - NOVOLOG MIX 70/30 FLEXPEN (70-30) 100 UNIT/ML FlexPen; 64 units with breakfast, 34 units with lunch and 64 units with dinner subcut  Dispense: 15 mL; Refill: 6  2. Essential hypertension Not at goal.  Continue Benicar 20 mg daily.  Add hydrochlorothiazide 12.5 mg daily. Check blood pressure at least twice a week with goal being 130/80 or lower.  She will be seeing the cardiologist in about 2 to 3 weeks.  Blood pressure can be rechecked at that time. - hydrochlorothiazide (HYDRODIURIL) 12.5 MG tablet; Take 1 tablet (12.5 mg total) by mouth daily.  Dispense: 30 tablet; Refill: 6 - olmesartan (BENICAR) 20 MG tablet; Take 1 tablet (20 mg total) by mouth daily.  Dispense: 90 tablet; Refill: 2  3. DOE (dyspnea on exertion) Differential diagnoses include deconditioning, angina equivalent, underlying asthma especially given the complaint of wheezing with this.  Give a trial of albuterol inhaler.  Referred to pulmonary. - Ambulatory referral to Pulmonology - Brain natriuretic peptide - albuterol (VENTOLIN HFA) 108 (90 Base) MCG/ACT inhaler; Inhale 2 puffs into the lungs every 6 (six) hours as needed for wheezing or shortness of breath.  Dispense: 8 g; Refill: 2  4. Wheezing - Ambulatory referral to Pulmonology - Amb ref to  Medical Nutrition Therapy-MNT - albuterol (VENTOLIN HFA) 108 (90 Base) MCG/ACT inhaler; Inhale 2 puffs into the lungs every 6 (six) hours as needed for wheezing or shortness of breath.  Dispense: 8 g; Refill: 2  5. Edema of both lower legs HCTZ added to Benicar which should help.  Advised to use of compression stockings - Brain natriuretic peptide  6. Mixed incontinence Discussed urinary incontinence.  Kegel's exercises encouraged.  Trial of Vesicare. - solifenacin (VESICARE) 5 MG tablet; Take 1 tablet (5 mg total) by mouth daily.  Dispense: 30 tablet; Refill: 4  7. Acquired hypothyroidism Continue levothyroxine 75 mcg daily. - TSH  8. History of uterine cancer I will find out from one of the gynecologist whether she still needs to be having Pap smear this far out from having had hysterectomy in 1998  9. Need for immunization against influenza - Flu Vaccine QUAD 77moIM (Fluarix, Fluzone & Alfiuria Quad PF)    Patient was given the opportunity to ask questions.  Patient verbalized understanding of the plan and was able to repeat key elements of  the plan.   Orders Placed This Encounter  Procedures   Flu Vaccine QUAD 62moIM (Fluarix, Fluzone & Alfiuria Quad PF)   Microalbumin / creatinine urine ratio   CBC   Comprehensive metabolic panel   Lipid panel   TSH   Brain natriuretic peptide   Ambulatory referral to Endocrinology   Ambulatory referral to Pulmonology   Amb ref to Medical Nutrition Therapy-MNT   POCT glucose (manual entry)   POCT glycosylated hemoglobin (Hb A1C)     Requested Prescriptions   Signed Prescriptions Disp Refills   solifenacin (VESICARE) 5 MG tablet 30 tablet 4    Sig: Take 1 tablet (5 mg total) by mouth daily.   albuterol (VENTOLIN HFA) 108 (90 Base) MCG/ACT inhaler 8 g 2    Sig: Inhale 2 puffs into the lungs every 6 (six) hours as needed for wheezing or shortness of breath.   hydrochlorothiazide (HYDRODIURIL) 12.5 MG tablet 30 tablet 6    Sig: Take  1 tablet (12.5 mg total) by mouth daily.   NOVOLOG MIX 70/30 FLEXPEN (70-30) 100 UNIT/ML FlexPen 15 mL 6    Sig: 64 units with breakfast, 34 units with lunch and 64 units with dinner subcut   olmesartan (BENICAR) 20 MG tablet 90 tablet 2    Sig: Take 1 tablet (20 mg total) by mouth daily.    Return in about 4 months (around 03/29/2022) for Sign a release for me to get your records from Dr. CTheda Sers  DKarle Plumber MD, FACP

## 2021-11-30 LAB — COMPREHENSIVE METABOLIC PANEL
ALT: 74 IU/L — ABNORMAL HIGH (ref 0–32)
AST: 44 IU/L — ABNORMAL HIGH (ref 0–40)
Albumin/Globulin Ratio: 1.6 (ref 1.2–2.2)
Albumin: 4.6 g/dL (ref 3.8–4.8)
Alkaline Phosphatase: 107 IU/L (ref 44–121)
BUN/Creatinine Ratio: 16 (ref 12–28)
BUN: 13 mg/dL (ref 8–27)
Bilirubin Total: 0.4 mg/dL (ref 0.0–1.2)
CO2: 26 mmol/L (ref 20–29)
Calcium: 9.7 mg/dL (ref 8.7–10.3)
Chloride: 103 mmol/L (ref 96–106)
Creatinine, Ser: 0.83 mg/dL (ref 0.57–1.00)
Globulin, Total: 2.9 g/dL (ref 1.5–4.5)
Glucose: 71 mg/dL (ref 70–99)
Potassium: 4.3 mmol/L (ref 3.5–5.2)
Sodium: 142 mmol/L (ref 134–144)
Total Protein: 7.5 g/dL (ref 6.0–8.5)
eGFR: 80 mL/min/{1.73_m2} (ref >59)

## 2021-11-30 LAB — CBC
Hematocrit: 38.1 % (ref 34.0–46.6)
Hemoglobin: 12.7 g/dL (ref 11.1–15.9)
MCH: 28.3 pg (ref 26.6–33.0)
MCHC: 33.3 g/dL (ref 31.5–35.7)
MCV: 85 fL (ref 79–97)
Platelets: 213 10*3/uL (ref 150–450)
RBC: 4.48 x10E6/uL (ref 3.77–5.28)
RDW: 14.7 % (ref 11.7–15.4)
WBC: 6.2 10*3/uL (ref 3.4–10.8)

## 2021-11-30 LAB — LIPID PANEL
Chol/HDL Ratio: 4 ratio (ref 0.0–4.4)
Cholesterol, Total: 177 mg/dL (ref 100–199)
HDL: 44 mg/dL (ref >39)
LDL Chol Calc (NIH): 109 mg/dL — ABNORMAL HIGH (ref 0–99)
Triglycerides: 133 mg/dL (ref 0–149)
VLDL Cholesterol Cal: 24 mg/dL (ref 5–40)

## 2021-11-30 LAB — TSH: TSH: 1.32 u[IU]/mL (ref 0.450–4.500)

## 2021-11-30 LAB — MICROALBUMIN / CREATININE URINE RATIO
Creatinine, Urine: 67.4 mg/dL
Microalb/Creat Ratio: 23 mg/g creat (ref 0–29)
Microalbumin, Urine: 15.6 ug/mL

## 2021-11-30 LAB — BRAIN NATRIURETIC PEPTIDE: BNP: 34.3 pg/mL (ref 0.0–100.0)

## 2021-12-01 ENCOUNTER — Encounter: Payer: Self-pay | Admitting: Internal Medicine

## 2021-12-01 ENCOUNTER — Other Ambulatory Visit: Payer: Self-pay | Admitting: Internal Medicine

## 2021-12-01 ENCOUNTER — Telehealth: Payer: Self-pay | Admitting: Internal Medicine

## 2021-12-01 DIAGNOSIS — R7989 Other specified abnormal findings of blood chemistry: Secondary | ICD-10-CM

## 2021-12-01 NOTE — Telephone Encounter (Signed)
Phone call placed to patient today to go over lab results. Patient informed that she has mild elevation in 2 of her liver function tests being the AST and ALT.  She is not on statin therapy.  She drinks alcoholic beverages occasionally. -Advised possible causes can include fatty liver which is common in patients with diabetes who are overweight.  We would also need to rule out viral hepatitis.  Recommend that she return to the lab to have blood test done to screen for hepatitis C and B.  If negative, we can do a liver ultrasound to screen for fatty liver. -Kidney function is okay. Blood cell counts are normal. Elevation in LDL cholesterol of 107 with goal being less than 70.  Healthy eating habits and regular exercise recommended.  Ideally she should be on statin therapy but we would need to first investigate why her liver function tests are elevated.  She has an upcoming appointment with the nutritionist. Thyroid level normal. Also informed patient that I had asked one of the gynecologist whether she needs to continue having Pap smears given that she had her hysterectomy in 1998 with no reoccurrence of cancer.  I was told that she does not.  All questions were answered.  Results for orders placed or performed in visit on 11/29/21  Microalbumin / creatinine urine ratio  Result Value Ref Range   Creatinine, Urine 67.4 Not Estab. mg/dL   Microalbumin, Urine 15.6 Not Estab. ug/mL   Microalb/Creat Ratio 23 0 - 29 mg/g creat  CBC  Result Value Ref Range   WBC 6.2 3.4 - 10.8 x10E3/uL   RBC 4.48 3.77 - 5.28 x10E6/uL   Hemoglobin 12.7 11.1 - 15.9 g/dL   Hematocrit 38.1 34.0 - 46.6 %   MCV 85 79 - 97 fL   MCH 28.3 26.6 - 33.0 pg   MCHC 33.3 31.5 - 35.7 g/dL   RDW 14.7 11.7 - 15.4 %   Platelets 213 150 - 450 x10E3/uL  Comprehensive metabolic panel  Result Value Ref Range   Glucose 71 70 - 99 mg/dL   BUN 13 8 - 27 mg/dL   Creatinine, Ser 0.83 0.57 - 1.00 mg/dL   eGFR 80 >59 mL/min/1.73    BUN/Creatinine Ratio 16 12 - 28   Sodium 142 134 - 144 mmol/L   Potassium 4.3 3.5 - 5.2 mmol/L   Chloride 103 96 - 106 mmol/L   CO2 26 20 - 29 mmol/L   Calcium 9.7 8.7 - 10.3 mg/dL   Total Protein 7.5 6.0 - 8.5 g/dL   Albumin 4.6 3.8 - 4.8 g/dL   Globulin, Total 2.9 1.5 - 4.5 g/dL   Albumin/Globulin Ratio 1.6 1.2 - 2.2   Bilirubin Total 0.4 0.0 - 1.2 mg/dL   Alkaline Phosphatase 107 44 - 121 IU/L   AST 44 (H) 0 - 40 IU/L   ALT 74 (H) 0 - 32 IU/L  Lipid panel  Result Value Ref Range   Cholesterol, Total 177 100 - 199 mg/dL   Triglycerides 133 0 - 149 mg/dL   HDL 44 >39 mg/dL   VLDL Cholesterol Cal 24 5 - 40 mg/dL   LDL Chol Calc (NIH) 109 (H) 0 - 99 mg/dL   Chol/HDL Ratio 4.0 0.0 - 4.4 ratio  TSH  Result Value Ref Range   TSH 1.320 0.450 - 4.500 uIU/mL  Brain natriuretic peptide  Result Value Ref Range   BNP 34.3 0.0 - 100.0 pg/mL  POCT glucose (manual entry)  Result Value  Ref Range   POC Glucose 109 (A) 70 - 99 mg/dl  POCT glycosylated hemoglobin (Hb A1C)  Result Value Ref Range   Hemoglobin A1C     HbA1c POC (<> result, manual entry)     HbA1c, POC (prediabetic range)     HbA1c, POC (controlled diabetic range) 9.3 (A) 0.0 - 7.0 %

## 2021-12-03 ENCOUNTER — Ambulatory Visit: Payer: 59 | Admitting: Podiatry

## 2021-12-06 ENCOUNTER — Other Ambulatory Visit: Payer: Self-pay

## 2021-12-06 ENCOUNTER — Ambulatory Visit: Payer: 59 | Attending: Internal Medicine

## 2021-12-06 DIAGNOSIS — R7989 Other specified abnormal findings of blood chemistry: Secondary | ICD-10-CM

## 2021-12-07 LAB — HEPATITIS B SURFACE ANTIGEN: Hepatitis B Surface Ag: NEGATIVE

## 2021-12-07 LAB — HEPATITIS C ANTIBODY: Hep C Virus Ab: NONREACTIVE

## 2021-12-08 ENCOUNTER — Other Ambulatory Visit: Payer: Self-pay | Admitting: Internal Medicine

## 2021-12-08 DIAGNOSIS — R7989 Other specified abnormal findings of blood chemistry: Secondary | ICD-10-CM

## 2021-12-09 ENCOUNTER — Ambulatory Visit (INDEPENDENT_AMBULATORY_CARE_PROVIDER_SITE_OTHER): Payer: 59 | Admitting: Podiatry

## 2021-12-09 ENCOUNTER — Encounter: Payer: Self-pay | Admitting: Podiatry

## 2021-12-09 ENCOUNTER — Other Ambulatory Visit: Payer: Self-pay

## 2021-12-09 DIAGNOSIS — B351 Tinea unguium: Secondary | ICD-10-CM

## 2021-12-09 DIAGNOSIS — L600 Ingrowing nail: Secondary | ICD-10-CM

## 2021-12-09 NOTE — Patient Instructions (Signed)

## 2021-12-10 NOTE — Progress Notes (Signed)
Subjective:   Patient ID: Jessica Pace, female   DOB: 61 y.o.   MRN: [id number]   HPI Patient states she has had some improvement in the fungus of her right nails with oral medication and she has an ingrown toenail left big toe at 1 point she would like to have corrected.  States that she understands the right big toenail second L still have some changes but it has improved somewhat   ROS      Objective:  Physical Exam  Neurovascular status intact with discoloration of the hallux and second nail right and incurvated lateral border left hallux moderately painful when pressed with no erythema drainage noted     Assessment:  Moderate improvement of nail disease right hallux second toe but part of this also may be due to overall trauma to the bed and ingrown toenail left     Plan:  Reviewed both conditions I do think the toenail should be corrected I explained procedure to remove the nail corner and educated her on this and she would like to do it on a Friday when she has the weekend off.  Do not think I can make much change beyond what has occurred in the right hallux second nail and she will always have some thickness of the nailbed but she can paint it but I do not recommend further oral antifungal therapy as I think trauma is a big part of the problem

## 2021-12-16 ENCOUNTER — Encounter: Payer: Self-pay | Admitting: *Deleted

## 2021-12-16 ENCOUNTER — Other Ambulatory Visit: Payer: Self-pay

## 2021-12-16 ENCOUNTER — Ambulatory Visit (INDEPENDENT_AMBULATORY_CARE_PROVIDER_SITE_OTHER): Payer: 59 | Admitting: Cardiovascular Disease

## 2021-12-16 ENCOUNTER — Encounter (HOSPITAL_BASED_OUTPATIENT_CLINIC_OR_DEPARTMENT_OTHER): Payer: Self-pay | Admitting: Cardiovascular Disease

## 2021-12-16 VITALS — BP 186/92 | Ht 64.0 in | Wt 254.2 lb

## 2021-12-16 DIAGNOSIS — Z5181 Encounter for therapeutic drug level monitoring: Secondary | ICD-10-CM | POA: Diagnosis not present

## 2021-12-16 DIAGNOSIS — I10 Essential (primary) hypertension: Secondary | ICD-10-CM

## 2021-12-16 DIAGNOSIS — E782 Mixed hyperlipidemia: Secondary | ICD-10-CM

## 2021-12-16 DIAGNOSIS — Z006 Encounter for examination for normal comparison and control in clinical research program: Secondary | ICD-10-CM

## 2021-12-16 MED ORDER — OLMESARTAN MEDOXOMIL-HCTZ 40-25 MG PO TABS: 1.0000 | ORAL_TABLET | Freq: Every day | ORAL | 3 refills | Status: DC

## 2021-12-16 NOTE — Patient Instructions (Addendum)
Medication Instructions:  STOP OLMESARTAN   STOP HYDROCHLOROTHIAZIDE  START OLMESARTAN-HCT 40-25  MG DAILY     Labwork: BMET IN 1 WEEK    Testing/Procedures: NONE    Follow-Up: 01/19/2022 2:00 PM WITH PHARM D AT [facility]  Special Instructions:  PAM OR LORA FROM PREP ([facility]) PROGRAM WILL BE IN TOUCH WITH YOU   AMY OUR CARE GUIDE WILL REACH OUT TO YOU ABOUT LIFESTYLE MANAGEMENT   MONITOR AND LOG YOUR BLOOD PRESSURE TWICE A DAY WITH MACHINE PROVIDE. LOG IN BOOK AND BRING TO FOLLOW UP   DASH Eating Plan DASH stands for "Dietary Approaches to Stop Hypertension." The DASH eating plan is a healthy eating plan that has been shown to reduce high blood pressure (hypertension). It may also reduce your risk for type 2 diabetes, heart disease, and stroke. The DASH eating plan may also help with weight loss. What are tips for following this plan?  General guidelines Avoid eating more than 2,300 mg (milligrams) of salt (sodium) a day. If you have hypertension, you may need to reduce your sodium intake to 1,500 mg a day. Limit alcohol intake to no more than 1 drink a day for nonpregnant women and 2 drinks a day for men. One drink equals 12 oz of beer, 5 oz of wine, or 1 oz of hard liquor. Work with your health care provider to maintain a healthy body weight or to lose weight. Ask what an ideal weight is for you. Get at least 30 minutes of exercise that causes your heart to beat faster (aerobic exercise) most days of the week. Activities may include walking, swimming, or biking. Work with your health care provider or diet and nutrition specialist (dietitian) to adjust your eating plan to your individual calorie needs. Reading food labels  Check food labels for the amount of sodium per serving. Choose foods with less than 5 percent of the Daily Value of sodium. Generally, foods with less than 300 mg of sodium per serving fit into this eating plan. To find whole grains, look for the word "whole" as  the first word in the ingredient list. Shopping Buy products labeled as "low-sodium" or "no salt added." Buy fresh foods. Avoid canned foods and premade or frozen meals. Cooking Avoid adding salt when cooking. Use salt-free seasonings or herbs instead of table salt or sea salt. Check with your health care provider or pharmacist before using salt substitutes. Do not fry foods. Cook foods using healthy methods such as baking, boiling, grilling, and broiling instead. Cook with heart-healthy oils, such as olive, canola, soybean, or sunflower oil. Meal planning Eat a balanced diet that includes: 5 or more servings of fruits and vegetables each day. At each meal, try to fill half of your plate with fruits and vegetables. Up to 6-8 servings of whole grains each day. Less than 6 oz of lean meat, poultry, or fish each day. A 3-oz serving of meat is about the same size as a deck of cards. One egg equals 1 oz. 2 servings of low-fat dairy each day. A serving of nuts, seeds, or beans 5 times each week. Heart-healthy fats. Healthy fats called Omega-3 fatty acids are found in foods such as flaxseeds and coldwater fish, like sardines, salmon, and mackerel. Limit how much you eat of the following: Canned or prepackaged foods. Food that is high in trans fat, such as fried foods. Food that is high in saturated fat, such as fatty meat. Sweets, desserts, sugary drinks, and other foods with added  sugar. Full-fat dairy products. Do not salt foods before eating. Try to eat at least 2 vegetarian meals each week. Eat more home-cooked food and less restaurant, buffet, and fast food. When eating at a restaurant, ask that your food be prepared with less salt or no salt, if possible. What foods are recommended? The items listed may not be a complete list. Talk with your dietitian about what dietary choices are best for you. Grains Whole-grain or whole-wheat bread. Whole-grain or whole-wheat pasta. Brown rice.  [place]. Bulgur. Whole-grain and low-sodium cereals. Pita bread. Low-fat, low-sodium crackers. Whole-wheat flour tortillas. Vegetables Fresh or frozen vegetables (raw, steamed, roasted, or grilled). Low-sodium or reduced-sodium tomato and vegetable juice. Low-sodium or reduced-sodium tomato sauce and tomato paste. Low-sodium or reduced-sodium canned vegetables. Fruits All fresh, dried, or frozen fruit. Canned fruit in natural juice (without added sugar). Meat and other protein foods Skinless chicken or [place]. Ground chicken or [place]. Pork with fat trimmed off. Fish and seafood. Egg whites. Dried beans, peas, or lentils. Unsalted nuts, nut butters, and seeds. Unsalted canned beans. Lean cuts of beef with fat trimmed off. Low-sodium, lean deli meat. Dairy Low-fat (1%) or fat-free (skim) milk. Fat-free, low-fat, or reduced-fat cheeses. Nonfat, low-sodium ricotta or cottage cheese. Low-fat or nonfat yogurt. Low-fat, low-sodium cheese. Fats and oils Soft margarine without trans fats. Vegetable oil. Low-fat, reduced-fat, or light mayonnaise and salad dressings (reduced-sodium). Canola, safflower, olive, soybean, and sunflower oils. Avocado. Seasoning and other foods Herbs. Spices. Seasoning mixes without salt. Unsalted popcorn and pretzels. Fat-free sweets. What foods are not recommended? The items listed may not be a complete list. Talk with your dietitian about what dietary choices are best for you. Grains Baked goods made with fat, such as croissants, muffins, or some breads. Dry pasta or rice meal packs. Vegetables Creamed or fried vegetables. Vegetables in a cheese sauce. Regular canned vegetables (not low-sodium or reduced-sodium). Regular canned tomato sauce and paste (not low-sodium or reduced-sodium). Regular tomato and vegetable juice (not low-sodium or reduced-sodium). Angie Fava. Olives. Fruits Canned fruit in a light or heavy syrup. Fried fruit. Fruit in cream or butter sauce. Meat  and other protein foods Fatty cuts of meat. Ribs. Fried meat. Berniece Salines. Sausage. Bologna and other processed lunch meats. Salami. Fatback. Hotdogs. Bratwurst. Salted nuts and seeds. Canned beans with added salt. Canned or smoked fish. Whole eggs or egg yolks. Chicken or [place] with skin. Dairy Whole or 2% milk, cream, and half-and-half. Whole or full-fat cream cheese. Whole-fat or sweetened yogurt. Full-fat cheese. Nondairy creamers. Whipped toppings. Processed cheese and cheese spreads. Fats and oils Butter. Stick margarine. Lard. Shortening. Ghee. Bacon fat. Tropical oils, such as coconut, palm kernel, or palm oil. Seasoning and other foods Salted popcorn and pretzels. Onion salt, garlic salt, seasoned salt, table salt, and sea salt. Worcestershire sauce. Tartar sauce. Barbecue sauce. Teriyaki sauce. Soy sauce, including reduced-sodium. Steak sauce. Canned and packaged gravies. Fish sauce. Oyster sauce. Cocktail sauce. Horseradish that you find on the shelf. Ketchup. Mustard. Meat flavorings and tenderizers. Bouillon cubes. Hot sauce and Tabasco sauce. Premade or packaged marinades. Premade or packaged taco seasonings. Relishes. Regular salad dressings. Where to find more information: National Heart, Lung, and [place]: https://wilson-eaton.com/ American Heart Association: www.heart.org Summary The DASH eating plan is a healthy eating plan that has been shown to reduce high blood pressure (hypertension). It may also reduce your risk for type 2 diabetes, heart disease, and stroke. With the DASH eating plan, you should limit salt (sodium) intake to 2,300  mg a day. If you have hypertension, you may need to reduce your sodium intake to 1,500 mg a day. When on the DASH eating plan, aim to eat more fresh fruits and vegetables, whole grains, lean proteins, low-fat dairy, and heart-healthy fats. Work with your health care provider or diet and nutrition specialist (dietitian) to adjust your eating plan to your  individual calorie needs. This information is not intended to replace advice given to you by your health care provider. Make sure you discuss any questions you have with your health care provider. Document Released: 09/22/2011 Document Revised: 09/15/2017 Document Reviewed: 09/26/2016 Elsevier Patient Education  2020 [employer].

## 2021-12-16 NOTE — Progress Notes (Incomplete)
Advanced Hypertension Clinic Initial Assessment:    Date:  12/16/2021   ID:  Jessica Pace, DOB 1961/01/02, MRN [id number]  PCP:  Ladell Pier, MD  Cardiologist:  None  Nephrologist:  Referring MD: Janie Morning, DO   CC: Hypertension  History of Present Illness:    Jessica Pace is a 61 y.o. female with a hx of hypertension, hypothyroidism, type 2 diabetes, and pneumonia, here to establish care in the Advanced Hypertension Clinic. She was seen in Urgent Care 07/30/2021 with symptoms concerning for lower respiratory infection. She saw her PCP 09/2021 and her BP was 138/84 on olmesartan.  She was first diagnosed with hypertension in 2013. 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 Her blood pressure has become more difficult to control in the last 6-7 months. She is not monitoring her blood pressure at home but plans to obtain a cuff soon. Also, she complains of swelling in her hands and her LLE. HCTZ was recently started to help with her edema. She endorses wheezing as well. She has some lower back pain that she attributes to her weight. In the past few months she has not had much exercise due to her busy work schedule. Concerning her diet she cooks some of her meals at home but is ordering out more often. She enjoys coffee, at least three 10 oz cups of coffee a day. She plans to cut back on this, in favor of water and green tea. She does not drink alcohol and has never smoked. She has been told that she snores. Sometimes she will feel well rested in the morning. She denies any daytime somnolence. A previous sleep study was reportedly normal. Since then her weight has changed in the past year. Of note, when she last saw her PCP she was told her liver enzyme levels were high. She denies any palpitations, chest pain, shortness of breath, or peripheral edema. No lightheadedness, headaches, syncope, orthopnea, or  PND.  Previous antihypertensives: None  Secondary Causes of Hypertension  Medications/Herbal: OCP, steroids, stimulants, antidepressants, weight loss medication, immune suppressants, NSAIDs, sympathomimetics, alcohol, caffeine, licorice, ginseng, St. John's wort, chemo  Sleep Apnea Renal artery stenosis Hyperaldosteronism Hyper/hypothyroidism Pheochromocytoma: palpitations, tachycardia, headache, diaphoresis (plasma metanephrines) Cushing's syndrome: Cushingoid facies, central obesity, proximal muscle weakness, and ecchymoses, adrenal incidentaloma (cortisol) Coarctation of the aorta  Past Medical History:  Diagnosis Date   Complication of anesthesia    "takes me a long time to come out" (09/05/2016)   Hypertension    Pneumonia 1990's X 2; 2013   Type II diabetes mellitus ([place])     Past Surgical History:  Procedure Laterality Date   ABDOMINAL HYSTERECTOMY  1998   CARPAL TUNNEL RELEASE Right ~ 2012   TUBAL LIGATION  1994    Current Medications: Current Meds  Medication Sig   albuterol (VENTOLIN HFA) 108 (90 Base) MCG/ACT inhaler Inhale 2 puffs into the lungs every 6 (six) hours as needed for wheezing or shortness of breath.   Ascorbic Acid (VITAMIN C) 1000 MG tablet Take 1,000 mg by mouth daily.   Blood Glucose Monitoring Suppl (ACCU-CHEK GUIDE ME) w/Device KIT See admin instructions.   Cod Liver Oil CAPS Take 1 capsule by mouth every morning.   GARLIC OIL PO Take 1 tablet by mouth daily.   Glucose Blood (BLOOD GLUCOSE TEST STRIPS) STRP Test twice a day. Pt uses one touch verio meter   hydrochlorothiazide (HYDRODIURIL) 12.5  MG tablet Take 1 tablet (12.5 mg total) by mouth daily.   Insulin Pen Needle (PEN NEEDLES) 32G X 4 MM MISC 1 each by Other route daily. Use with Basalgar   levothyroxine (SYNTHROID) 75 MCG tablet Take 75 mcg by mouth every morning.   NOVOLOG MIX 70/30 FLEXPEN (70-30) 100 UNIT/ML FlexPen 64 units with breakfast, 34 units with lunch and 64 units with  dinner subcut   olmesartan (BENICAR) 20 MG tablet Take 1 tablet (20 mg total) by mouth daily.   ONETOUCH DELICA LANCETS FINE MISC Test twice a daily. Pt uses a one touch verio meter   solifenacin (VESICARE) 5 MG tablet Take 1 tablet (5 mg total) by mouth daily.     Allergies:   Canagliflozin, Dulaglutide, Insulin degludec, Metformin hcl, and Butyl acetate (n-butyl acetate)   Social History   Socioeconomic History   Marital status: Legally Separated    Spouse name: Not on file   Number of children: 2   Years of education: Not on file   Highest education level: Not on file  Occupational History   Occupation: Visual merchandiser  Tobacco Use   Smoking status: Never   Smokeless tobacco: Never  Substance and Sexual Activity   Alcohol use: No   Drug use: No   Sexual activity: Not Currently    Birth control/protection: Surgical  Other Topics Concern   Not on file  Social History Narrative   Not on file   Social Determinants of Health   Financial Resource Strain: Low Risk    Difficulty of Paying Living Expenses: Not hard at all  Food Insecurity: No Food Insecurity   Worried About Charity fundraiser in the Last Year: Never true   [place] in the Last Year: Never true  Transportation Needs: No Transportation Needs   Lack of Transportation (Medical): No   Lack of Transportation (Non-Medical): No  Physical Activity: Insufficiently Active   Days of Exercise per Week: 5 days   Minutes of Exercise per Session: 20 min  Stress: Not on file  Social Connections: Not on file     Family History: The patient's family history includes Diabetes in her mother; Heart failure in her maternal grandmother; Hyperlipidemia in her father; Hypertension in her father. There is no history of Colon cancer, Stomach cancer, Rectal cancer, Esophageal cancer, Liver cancer, or Breast cancer.  ROS:   Please see the history of present illness.    (+) Stress (+) Left LE edema (+)  Wheezing (+) Lower back pain (+) Snoring All other systems reviewed and are negative.  EKGs/Labs/Other Studies Reviewed:    Left LE Venous Doppler 06/14/2021: Summary:  Left:  - No evidence of deep vein thrombosis seen in the left lower extremity,  from the common femoral through the popliteal veins.  - No evidence of superficial venous thrombosis in the left lower  extremity.     - Venous reflux is noted in the left sapheno-femoral junction.   EKG:   12/16/2021: EKG was not ordered.  Recent Labs: 11/29/2021: ALT 74; BNP 34.3; BUN 13; Creatinine, Ser 0.83; Hemoglobin 12.7; Platelets 213; Potassium 4.3; Sodium 142; TSH 1.320   Recent Lipid Panel    Component Value Date/Time   CHOL 177 11/29/2021 1146   TRIG 133 11/29/2021 1146   HDL 44 11/29/2021 1146   CHOLHDL 4.0 11/29/2021 1146   CHOLHDL 7.8 (H) 01/18/2017 1019   VLDL 72 (H) 01/18/2017 1019   LDLCALC 109 (H) 11/29/2021 1146  Physical Exam:    VS:  BP (!) 186/92 (BP Location: Right Arm, Patient Position: Sitting, Cuff Size: Large)    Ht '5\' 4"'  (1.626 m)    Wt 254 lb 3.2 oz (115.3 kg)    BMI 43.63 kg/m  , BMI Body mass index is 43.63 kg/m. GENERAL:  Well appearing HEENT: Pupils equal round and reactive, fundi not visualized, oral mucosa unremarkable NECK:  No jugular venous distention, waveform within normal limits, carotid upstroke brisk and symmetric, no bruits, no thyromegaly LYMPHATICS:  No cervical adenopathy LUNGS:  ***Wheezing HEART:  RRR.  PMI not displaced or sustained,S1 and S2 within normal limits, no S3, no S4, no clicks, no rubs, *** murmurs ABD:  Flat, positive bowel sounds normal in frequency in pitch, no bruits, no rebound, no guarding, no midline pulsatile mass, no hepatomegaly, no splenomegaly EXT:  2 plus pulses throughout, ***no edema, no cyanosis no clubbing SKIN:  No rashes no nodules NEURO:  Cranial nerves II through XII grossly intact, motor grossly intact throughout PSYCH:  Cognitively intact,  oriented to person place and time   ASSESSMENT/PLAN:    No problem-specific Assessment & Plan notes found for this encounter.   Screening for Secondary Hypertension: { Click here to document screening for secondary causes of HTN  :[id number]} Causes 12/16/2021  Drugs/Herbals Screened    Relevant Labs/Studies: Basic Labs Latest Ref Rng & Units 11/29/2021 09/07/2019 05/28/2018  Sodium 134 - 144 mmol/L 142 131(L) 136  Potassium 3.5 - 5.2 mmol/L 4.3 4.4 4.8  Creatinine 0.57 - 1.00 mg/dL 0.83 0.78 0.81    Thyroid  Latest Ref Rng & Units 11/29/2021 05/28/2018  TSH 0.450 - 4.500 uIU/mL 1.320 5.190(H)                    she consents to be monitored in our remote patient monitoring program through [place].  she will track his blood pressure twice daily and understands that these trends will help [place] to adjust her medications as needed prior to his next appointment.  she *** interested in enrolling in the PREP exercise and nutrition program through the [facility].     Disposition:    FU with APP/PharmD in 1 month for the next 3 months.   FU with Tiffany C. Oval Linsey, MD, [facility] in 4 months.  Medication Adjustments/Labs and Tests Ordered: Current medicines are reviewed at length with the patient today.  Concerns regarding medicines are outlined above.   No orders of the defined types were placed in this encounter.  No orders of the defined types were placed in this encounter.  I,Mathew Stumpf,acting as a Education administrator for Skeet Latch, MD.,have documented all relevant documentation on the behalf of Skeet Latch, MD,as directed by  Skeet Latch, MD while in the presence of Skeet Latch, MD.  ***  Signed, Madelin Rear  12/16/2021 4:45 PM    [place]

## 2021-12-16 NOTE — Research (Signed)
?  Subject Name: Jessica Pace ? ?[name] met inclusion and exclusion criteria for the Virtual Care and Social Determinant Interventions for the management of hypertension trial.  The informed consent form, study requirements and expectations were reviewed with the subject by Dr. Oval Linsey and myself. The subject was given the opportunity to read the consent and ask questions. The subject verbalized understanding of the trial requirements.  All questions were addressed prior to the signing of the consent form. The subject agreed to participate in the trial and signed the informed consent. The informed consent was obtained prior to performance of any protocol-specific procedures for the subject.  A copy of the signed informed consent was given to the subject and a copy was placed in the subject's medical record.  ?Jessica Pace was randomized to Group 1.   ?

## 2021-12-17 ENCOUNTER — Encounter (HOSPITAL_BASED_OUTPATIENT_CLINIC_OR_DEPARTMENT_OTHER): Payer: Self-pay

## 2021-12-21 ENCOUNTER — Telehealth: Payer: Self-pay

## 2021-12-21 DIAGNOSIS — Z0000 Encounter for general adult medical examination without abnormal findings: Secondary | ICD-10-CM

## 2021-12-21 NOTE — Telephone Encounter (Signed)
Called patient per referral from Dr. Oval Linsey for lifestyle changes. Patient has been encouraged by Dr. Oval Linsey to adopt the DASH diet and monitor her sodium intake. Offered health coaching to patient to support patient with these changes. Patient is interested in health coaching to help her lose weight and get her blood pressure down. Patient has been scheduled for her initial health coaching session over the phone on 3/14 at 3:30pm. Patient will be closed at that time. ? ? ?Avelino Leeds, [place] ?[facility] ?Care Guide, Health Coach ?[address] ?[place] [address] ?Telephone: [phone] ?Email: [email or web address] ? ?

## 2021-12-23 ENCOUNTER — Telehealth: Payer: Self-pay

## 2021-12-23 NOTE — Telephone Encounter (Signed)
Called to discuss PREP program; she is interested and wants to attend, Jessica Pace after work is best for her; she is flexible with schedule. Will contact her later in March for next M/W class at [facility] 4-5:15, when date is determined for April  ?

## 2021-12-27 LAB — BASIC METABOLIC PANEL
BUN/Creatinine Ratio: 21 (ref 12–28)
BUN: 21 mg/dL (ref 8–27)
CO2: 22 mmol/L (ref 20–29)
Calcium: 9.7 mg/dL (ref 8.7–10.3)
Chloride: 99 mmol/L (ref 96–106)
Creatinine, Ser: 1 mg/dL (ref 0.57–1.00)
Glucose: 181 mg/dL — ABNORMAL HIGH (ref 70–99)
Potassium: 4.5 mmol/L (ref 3.5–5.2)
Sodium: 137 mmol/L (ref 134–144)
eGFR: 64 mL/min/{1.73_m2} (ref >59)

## 2021-12-28 ENCOUNTER — Other Ambulatory Visit: Payer: Self-pay

## 2021-12-28 ENCOUNTER — Ambulatory Visit (INDEPENDENT_AMBULATORY_CARE_PROVIDER_SITE_OTHER): Payer: 59

## 2021-12-28 DIAGNOSIS — Z0000 Encounter for general adult medical examination without abnormal findings: Secondary | ICD-10-CM

## 2021-12-28 NOTE — Progress Notes (Signed)
Appointment Outcome: Completed, Session #: Initial health coaching session ?Start time: 3:31pm   End time: 4:22pm   Total Mins: 51 minutes ? ?AGREEMENTS SECTION ? ? ?Overall Goal(s): ?Improving healthy eating habits  ?Increase physical activity                                             ? ?Agreement/Action Steps:  ?Improve healthy eating habits ?Follow DASH diet ?Read food labels to monitor sodium and sugar consumption ?Maintain sodium intake at 1,'500mg'$ /day ?Maintain drinking 6 (16.9 oz) bottles of water per day ?Continue eating a light breakfast daily ?Continue eating healthy snacks ?Practice portion control ?Meal plan/prep for 2-3 days ? ?Increase physical activity ?Walk track at [facility] 3xs/week for 30 minutes ?Find a kickboxing class ?Find a line dancing class ?Attend PREP starting around April 10th ? ?Progress Notes:  ?Patient shared that for the past few months she has been making changes to her eating habits. Patient discussed with Dr. Oval Linsey during her last visit her recommendation to follow the DASH diet and to monitor her sodium intake.  ? ?Patient stated that since her visit she has been incorporating breakfast such as half a [place] sandwich. Patient stated that she picks up what she can for dinner. Patient is incorporating healthy snacks (e.g., bananas, grapes, avocado) and is mindful of what she eats to avoid raising her blood sugar. Patient mentioned that she has been cutting back on sugar and eats fresh fruit. Patient stated that she has stopped drinking soft drinks. Patient consumes about 6 (16.9 oz) bottles of water daily while working.  ? ?Patient stated that she is reading for labels to monitor her sodium and sugar intake. Patient reported that she don't typically use salt when cooking. Patient has been using herbs and spices to season her food.  ? ?Patient is also interested in increasing her physical activity as well. Patient stated that she is interested in line dancing, golfing, and  kickboxing. Patient has been contemplating about walking the track at [facility]. Patient has been contacted regarding participating in [place] starting April 10th. Patient stated that she will be participating in PREP. Patient shared that she also is physically active at work because she must walk the site.   ? ?Patient expressed that her reason for wanting to increase physical activity and improve her healthy eating habits because her health matters to her and she wants to live. Patient is aiming to lose approximately 20 lbs. in the next three months.  ? ? ?Coaching Outcomes: ?Patient is interested in meal planning/prepping. Patient will meal plan/prep for 2-3 days at a time to deter her from eating out for dinner. Patient will continue to incorporate a light breakfast and eat healthy snacks. Patient will continue to practice portion control. Patient will maintain drinking 6 (16.9 oz) of water per day.  ? ?Patient was emailed a copy of the [place] that was provided in her AVS. Patient was emailed additional documents pertaining to her action steps such as a weekly food journal to help with meal planning, information on meal planning/prepping, practicing portion control, and reading food labels.  ? ?Patient has decided to monitor her sodium intake by reading food labels so that she will not consume no more than 1,'500mg'$  of sodium. Patient will continue to use herbs and spices to season her food. ? ?Patient will start walking on  the track at [facility] 3xs/week for 30 minutes each time. Patient will being PREP after April 10th. Patient will look into finding a line dance or kickboxing class.  ? ?

## 2021-12-28 NOTE — Patient Instructions (Signed)

## 2021-12-29 ENCOUNTER — Ambulatory Visit (INDEPENDENT_AMBULATORY_CARE_PROVIDER_SITE_OTHER): Payer: 59 | Admitting: Pulmonary Disease

## 2021-12-29 ENCOUNTER — Encounter: Payer: Self-pay | Admitting: Pulmonary Disease

## 2021-12-29 VITALS — BP 122/66 | HR 91 | Ht 64.0 in | Wt 246.6 lb

## 2021-12-29 DIAGNOSIS — J683 Other acute and subacute respiratory conditions due to chemicals, gases, fumes and vapors: Secondary | ICD-10-CM

## 2021-12-29 MED ORDER — ADVAIR HFA 115-21 MCG/ACT IN AERO: 2.0000 | INHALATION_SPRAY | Freq: Two times a day (BID) | RESPIRATORY_TRACT | 12 refills | Status: DC

## 2021-12-29 NOTE — Patient Instructions (Addendum)
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  ? ?Start advair inhaler 2 puffs twice daily ?- rinse mouth out after each use ? ?Continue to use albuterol inhaler 1-2 puffs every 4-6 hours  ? ?Try mucinex as needed for mucous clearance.  ? ?Follow up in 2 months for pulmonary function tests  ?

## 2021-12-29 NOTE — Progress Notes (Signed)
? ?Synopsis: Referred in March 2023 for shortness of breath/wheezing by Karle Plumber, MD ? ?Subjective:  ? ?PATIENT ID: Jessica Pace GENDER: female DOB: Nov 20, 1960, MRN: [id number] ? ?HPI ? ?Chief Complaint  ?Patient presents with  ? Consult  ?  Referred by PCP for increased SOB and wheezing for the past 2 years. Denies any increased coughing. Had COVID December 2022  ? ?Jessica Pace is a 61 year old woman, never smoker with hypertension and DMII who is referred to pulmonary clinic for shortness of breath and wheezing.  ? ?Patient reports having increased shortness of breath, cough and wheezing over the past 2 years and more specifically since she had COVID-19 infection in December 2022. ? ?He has been using as needed albuterol with some relief.  She denies any sinus congestion or postnasal drainage.  She denies any heartburn or reflux symptoms. ? ?She is a never smoker and denies any secondhand smoke exposure.  She currently works as an Production designer, theatre/television/film.  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  She previously used maintenance inhalers and montelukast with improvement of her symptoms. ? ?Past Medical History:  ?Diagnosis Date  ? Complication of anesthesia   ? "takes me a long time to come out" (09/05/2016)  ? Hypertension   ? Pneumonia 1990's X 2; 2013  ? Type II diabetes mellitus ([place])   ?  ? ?Family History  ?Problem Relation Age of Onset  ? Diabetes Mother   ? Hyperlipidemia Father   ? Hypertension Father   ? Heart failure Maternal Grandmother   ? Colon cancer Neg Hx   ? Stomach cancer Neg Hx   ? Rectal cancer Neg Hx   ? Esophageal cancer Neg Hx   ? Liver cancer Neg Hx   ? Breast cancer Neg Hx   ?  ? ?Social History  ? ?Socioeconomic History  ? Marital status: Legally Separated  ?  Spouse name: Not on file  ? Number of children: 2  ? Years of education: Not on file  ? Highest education level: Not on file   ?Occupational History  ? Occupation: Visual merchandiser  ?Tobacco Use  ? Smoking status: Never  ? Smokeless tobacco: Never  ?Substance and Sexual Activity  ? Alcohol use: No  ? Drug use: No  ? Sexual activity: Not Currently  ?  Birth control/protection: Surgical  ?Other Topics Concern  ? Not on file  ?Social History Narrative  ? Not on file  ? ?Social Determinants of Health  ? ?Financial Resource Strain: Low Risk   ? Difficulty of Paying Living Expenses: Not hard at all  ?Food Insecurity: No Food Insecurity  ? Worried About Charity fundraiser in the Last Year: Never true  ? Ran Out of Food in the Last Year: Never true  ?Transportation Needs: No Transportation Needs  ? Lack of Transportation (Medical): No  ? Lack of Transportation (Non-Medical): No  ?Physical Activity: Insufficiently Active  ? Days of Exercise per Week: 5 days  ? Minutes of Exercise per Session: 20 min  ?Stress: Not on file  ?Social Connections: Not on file  ?Intimate Partner Violence: Not on file  ?  ? ?Allergies  ?Allergen Reactions  ? Canagliflozin   ?  Other reaction(s): yeast  ? Dulaglutide Other (See Comments)  ?  Patient reports it caused flare of diverticulitis.  ? Insulin Degludec Other (See Comments)  ? Metformin Hcl Diarrhea and Other (See  Comments)  ? Butyl Acetate (N-Butyl Acetate) Other (See Comments)  ?  In paints and nail polish- unknown  ?  ? ?Outpatient Medications Prior to Visit  ?Medication Sig Dispense Refill  ? albuterol (VENTOLIN HFA) 108 (90 Base) MCG/ACT inhaler Inhale 2 puffs into the lungs every 6 (six) hours as needed for wheezing or shortness of breath. 8 g 2  ? Ascorbic Acid (VITAMIN C) 1000 MG tablet Take 1,000 mg by mouth daily.    ? Blood Glucose Monitoring Suppl (ACCU-CHEK GUIDE ME) w/Device KIT See admin instructions.    ? Cod Liver Oil CAPS Take 1 capsule by mouth every morning.    ? GARLIC OIL PO Take 1 tablet by mouth daily.    ? Glucose Blood (BLOOD GLUCOSE TEST STRIPS) STRP Test twice a day. Pt uses one  touch verio meter 100 each 5  ? Insulin Pen Needle (PEN NEEDLES) 32G X 4 MM MISC 1 each by Other route daily. Use with Basalgar 30 each 0  ? levothyroxine (SYNTHROID) 75 MCG tablet Take 75 mcg by mouth every morning.    ? NOVOLOG MIX 70/30 FLEXPEN (70-30) 100 UNIT/ML FlexPen 64 units with breakfast, 34 units with lunch and 64 units with dinner subcut 15 mL 6  ? olmesartan-hydrochlorothiazide (BENICAR HCT) 40-25 MG tablet Take 1 tablet by mouth daily. 90 tablet 3  ? ONETOUCH DELICA LANCETS FINE MISC Test twice a daily. Pt uses a one touch verio meter 100 each 5  ? solifenacin (VESICARE) 5 MG tablet Take 1 tablet (5 mg total) by mouth daily. 30 tablet 4  ? ?No facility-administered medications prior to visit.  ? ?Review of Systems  ?Constitutional:  Negative for chills, fever, malaise/fatigue and weight loss.  ?HENT:  Negative for congestion, sinus pain and sore throat.   ?Eyes: Negative.   ?Respiratory:  Positive for cough and shortness of breath. Negative for hemoptysis, sputum production and wheezing.   ?Cardiovascular:  Negative for chest pain, palpitations, orthopnea, claudication and leg swelling.  ?Gastrointestinal:  Negative for abdominal pain, heartburn, nausea and vomiting.  ?Genitourinary: Negative.   ?Musculoskeletal:  Positive for joint pain. Negative for myalgias.  ?Skin:  Negative for rash.  ?Neurological:  Negative for weakness.  ?Endo/Heme/Allergies: Negative.   ?Psychiatric/Behavioral: Negative.    ? ?Objective:  ? ?Vitals:  ? 12/29/21 1530  ?BP: 122/66  ?Pulse: 91  ?SpO2: 98%  ?Weight: 246 lb 9.6 oz (111.9 kg)  ?Height: _0  (1.626 m)  ? ? ? ?Physical Exam ?Constitutional:   ?   General: She is not in acute distress. ?   Appearance: She is obese. She is not ill-appearing.  ?HENT:  ?   Head: Normocephalic and atraumatic.  ?Eyes:  ?   General: No scleral icterus. ?   Conjunctiva/sclera: Conjunctivae normal.  ?   Pupils: Pupils are equal, round, and reactive to light.  ?Cardiovascular:  ?   Rate and  Rhythm: Normal rate and regular rhythm.  ?   Pulses: Normal pulses.  ?   Heart sounds: Normal heart sounds. No murmur heard. ?Pulmonary:  ?   Effort: Pulmonary effort is normal.  ?   Breath sounds: Normal breath sounds. No wheezing, rhonchi or rales.  ?Abdominal:  ?   General: Bowel sounds are normal.  ?   Palpations: Abdomen is soft.  ?Musculoskeletal:  ?   Right lower leg: No edema.  ?   Left lower leg: No edema.  ?Lymphadenopathy:  ?   Cervical: No cervical adenopathy.  ?Skin: ?  General: Skin is warm and dry.  ?Neurological:  ?   General: No focal deficit present.  ?   Mental Status: She is alert.  ?Psychiatric:     ?   Mood and Affect: Mood normal.     ?   Behavior: Behavior normal.     ?   Thought Content: Thought content normal.     ?   Judgment: Judgment normal.  ? ? ?CBC ?   ?Component Value Date/Time  ? WBC 6.2 11/29/2021 1146  ? WBC 6.3 09/07/2019 1037  ? RBC 4.48 11/29/2021 1146  ? RBC 4.60 09/07/2019 1037  ? HGB 12.7 11/29/2021 1146  ? HCT 38.1 11/29/2021 1146  ? PLT 213 11/29/2021 1146  ? MCV 85 11/29/2021 1146  ? MCH 28.3 11/29/2021 1146  ? MCH 28.7 09/07/2019 1037  ? MCHC 33.3 11/29/2021 1146  ? MCHC 33.2 09/07/2019 1037  ? RDW 14.7 11/29/2021 1146  ? LYMPHSABS 1.7 09/07/2019 1037  ? LYMPHSABS 2.2 05/28/2018 1539  ? MONOABS 0.4 09/07/2019 1037  ? EOSABS 0.1 09/07/2019 1037  ? EOSABS 0.1 05/28/2018 1539  ? BASOSABS 0.0 09/07/2019 1037  ? BASOSABS 0.0 05/28/2018 1539  ? ?BMP Latest Ref Rng & Units 12/27/2021 11/29/2021 09/07/2019  ?Glucose 70 - 99 mg/dL 181(H) 71 304(H)  ?BUN 8 - 27 mg/dL _0 ?Creatinine 0.57 - 1.00 mg/dL 1.00 0.83 0.78  ?BUN/Creat Ratio 12 - _1 -  ?Sodium 134 - 144 mmol/L 137 142 131(L)  ?Potassium 3.5 - 5.2 mmol/L 4.5 4.3 4.4  ?Chloride 96 - 106 mmol/L 99 103 98  ?CO2 20 - 29 mmol/L _2 ?Calcium 8.7 - 10.3 mg/dL 9.7 9.7 9.0  ? ?Chest imaging: ?CXR 07/30/21 ?Lung volumes are low. Diffuse peribronchial cuffing. No ?consolidative airspace disease. No pleural effusions.  No ?pneumothorax. No pulmonary nodule or mass noted. Pulmonary ?vasculature and the cardiomediastinal silhouette are within normal ?limits. ? ?PFT: ?No flowsheet data found. ? ?Labs: ? ?Path: ? ?Echo: ? ?Heart Cat

## 2022-01-11 ENCOUNTER — Ambulatory Visit: Payer: 59

## 2022-01-13 ENCOUNTER — Ambulatory Visit: Payer: 59 | Admitting: Dietician

## 2022-01-14 ENCOUNTER — Ambulatory Visit: Payer: 59

## 2022-01-14 ENCOUNTER — Telehealth: Payer: Self-pay

## 2022-01-14 DIAGNOSIS — Z0000 Encounter for general adult medical examination without abnormal findings: Secondary | ICD-10-CM

## 2022-01-14 NOTE — Telephone Encounter (Signed)
Called patient to hold health coaching session over the phone. Patient did not answer. Was not able to leave a voicemail because it was full.  ? ? ?Avelino Leeds, [place] ?[facility] ?Care Guide, Health Coach ?[address] ?[place] [address] ?Telephone: [phone] ?Email: [email or web address] ? ?

## 2022-01-19 ENCOUNTER — Ambulatory Visit (INDEPENDENT_AMBULATORY_CARE_PROVIDER_SITE_OTHER): Payer: 59 | Admitting: Pharmacist Clinician (PhC)/ Clinical Pharmacy Specialist

## 2022-01-19 ENCOUNTER — Ambulatory Visit: Payer: 59

## 2022-01-19 ENCOUNTER — Encounter (HOSPITAL_BASED_OUTPATIENT_CLINIC_OR_DEPARTMENT_OTHER): Payer: Self-pay | Admitting: Pharmacist Clinician (PhC)/ Clinical Pharmacy Specialist

## 2022-01-19 VITALS — BP 126/78 | HR 78 | Ht 64.0 in | Wt 247.6 lb

## 2022-01-19 DIAGNOSIS — I10 Essential (primary) hypertension: Secondary | ICD-10-CM | POA: Diagnosis not present

## 2022-01-19 DIAGNOSIS — E118 Type 2 diabetes mellitus with unspecified complications: Secondary | ICD-10-CM

## 2022-01-19 DIAGNOSIS — Z794 Long term (current) use of insulin: Secondary | ICD-10-CM | POA: Diagnosis not present

## 2022-01-19 MED ORDER — AMLODIPINE BESYLATE 5 MG PO TABS: 5.0000 mg | ORAL_TABLET | Freq: Every day | ORAL | 3 refills | Status: DC

## 2022-01-19 NOTE — Assessment & Plan Note (Signed)
Patient with essential hypertension, still not at BP goal.  Home readings still averaging 140-141.  Patient determined to make lifestyle changes in order to reduce medication dependence.   Advised that she would do better to start amlodipine now and thus have more incentive to make those changes.  Will start amlodipine 5 mg daily and see patient back in one month.  Also put in referral to endocrinology, as patient has had problems with multiple DM medications and is on insulin alone at this time.   ?

## 2022-01-19 NOTE — Patient Instructions (Signed)
Return for a a follow up appointment May 3 at 3:30 pm ? ?Check your blood pressure at home daily and keep record of the readings. ? ?Take your BP meds as follows: ? Start amlodipine 5 mg once daily at the opposite time as your olmesartan/hctz ? Continue with all other medications ? ?If you have any problems or concerns, please give me a call [phone]) ? ?Bring all of your meds, your BP cuff and your record of home blood pressures to your next appointment.  Exercise as you?re able, try to walk approximately 30 minutes per day.  Keep salt intake to a minimum, especially watch canned and prepared boxed foods.  Eat more fresh fruits and vegetables and fewer canned items.  Avoid eating in fast food restaurants.  ? ? HOW TO TAKE YOUR BLOOD PRESSURE: ?Rest 5 minutes before taking your blood pressure. ? Don?t smoke or drink caffeinated beverages for at least 30 minutes before. ?Take your blood pressure before (not after) you eat. ?Sit comfortably with your back supported and both feet on the floor (don?t cross your legs). ?Elevate your arm to heart level on a table or a desk. ?Use the proper sized cuff. It should fit smoothly and snugly around your bare upper arm. There should be enough room to slip a fingertip under the cuff. The bottom edge of the cuff should be 1 inch above the crease of the elbow. ?Ideally, take 3 measurements at one sitting and record the average. ? ? ?

## 2022-01-19 NOTE — Progress Notes (Signed)
? ? ? ?01/19/2022 ?[name] ?08-Jul-1961 ?[id number] ? ? ?HPI:  Jessica Pace is a 61 y.o. female patient of Dr Oval Linsey, with a [place] below who presents today for hypertension clinic evaluation.  She was seen by Dr. Oval Linsey in March, at which time her pressure was noted to be 162/81.  At that visit doses of olmesartan and hctz were both increased and prescribed in a single 40/25 mg tablet.  Patient was agreeable to our remote monitoring research and randomized to group 1.   ? ?Today she returns for follow up.  Notes developed problems with BP for around 8 years, was controlled until about 18 months ago with death of father.   ? ?Past Medical History: ?hyperlipidemia 2/23 LDL 109, no medications  ?obesity BMI 42.31  ?hypothyroidism 2/23 TSH 1.32 - on levothyroxine 75 mcg  ?DM2 A1c 9.4 - on novolog 70/30  ?   ?  ? ?Blood Pressure Goal:  130/80 ? ?Current Medications: olmesartan hctz 40/25 mg qd ? ?Family Hx: father with CHF died at 78; mother DM; 2 brothers both with hypertension; 2 daughters both healthy ? ?Social Hx: no tobacco, occasional glass of wine; had decreased caffeine, down to 1 8oz coffee in am, quit soft drinks ? ?Diet: mix of eating and home/made cabbage - kimchi, good mix of vegetables fresh or frozen, avoids canned; mix of proteins - mostly fish; doesn't add salt ? ?Exercise: started walking more at work, meets with PREP later this month.   ? ?Home BP readings:  ? AM: 33 readings average 141/78 HR 80 (range 102-165/67-97) ? PM: 32 readings average 140/76 HR 85 (range 109-173/65/94) ? ?Intolerances: no cardiac meds, multiple DM medications ? ?Labs: 12/27/21: Na 137, K 4.5, Glu 181, BN 21, SCr 1.0, GFR 64 ? ? ?Wt Readings from Last 3 Encounters:  ?01/19/22 247 lb 9.6 oz (112.3 kg)  ?12/29/21 246 lb 9.6 oz (111.9 kg)  ?12/16/21 254 lb 3.2 oz (115.3 kg)  ? ?BP Readings from Last 3 Encounters:  ?01/19/22 126/78  ?12/29/21 122/66  ?12/16/21 (!) 186/92  ? ?Pulse Readings from Last 3 Encounters:  ?01/19/22  78  ?12/29/21 91  ?11/29/21 77  ? ? ?Current Outpatient Medications  ?Medication Sig Dispense Refill  ? albuterol (VENTOLIN HFA) 108 (90 Base) MCG/ACT inhaler Inhale 2 puffs into the lungs every 6 (six) hours as needed for wheezing or shortness of breath. 8 g 2  ? amLODipine (NORVASC) 5 MG tablet Take 1 tablet (5 mg total) by mouth daily. 30 tablet 3  ? Ascorbic Acid (VITAMIN C) 1000 MG tablet Take 1,000 mg by mouth daily.    ? Blood Glucose Monitoring Suppl (ACCU-CHEK GUIDE ME) w/Device KIT See admin instructions.    ? Cod Liver Oil CAPS Take 1 capsule by mouth every morning.    ? GARLIC OIL PO Take 1 tablet by mouth daily.    ? Glucose Blood (BLOOD GLUCOSE TEST STRIPS) STRP Test twice a day. Pt uses one touch verio meter 100 each 5  ? Insulin Pen Needle (PEN NEEDLES) 32G X 4 MM MISC 1 each by Other route daily. Use with Basalgar 30 each 0  ? levothyroxine (SYNTHROID) 75 MCG tablet Take 75 mcg by mouth every morning.    ? NOVOLOG MIX 70/30 FLEXPEN (70-30) 100 UNIT/ML FlexPen 64 units with breakfast, 34 units with lunch and 64 units with dinner subcut 15 mL 6  ? olmesartan-hydrochlorothiazide (BENICAR HCT) 40-25 MG tablet Take 1 tablet by mouth daily. 90 tablet 3  ?  ONETOUCH DELICA LANCETS FINE MISC Test twice a daily. Pt uses a one touch verio meter 100 each 5  ? solifenacin (VESICARE) 5 MG tablet Take 1 tablet (5 mg total) by mouth daily. 30 tablet 4  ? fluticasone-salmeterol (ADVAIR HFA) 115-21 MCG/ACT inhaler Inhale 2 puffs into the lungs 2 (two) times daily. (Patient not taking: Reported on 01/19/2022) 1 each 12  ? ?No current facility-administered medications for this visit.  ? ? ?Allergies  ?Allergen Reactions  ? Canagliflozin   ?  Other reaction(s): yeast  ? Dulaglutide Other (See Comments)  ?  Patient reports it caused flare of diverticulitis.  ? Insulin Degludec Other (See Comments)  ? Metformin Hcl Diarrhea and Other (See Comments)  ? Butyl Acetate (N-Butyl Acetate) Other (See Comments)  ?  In paints and  nail polish- unknown  ? ? ?Past Medical History:  ?Diagnosis Date  ? Complication of anesthesia   ? "takes me a long time to come out" (09/05/2016)  ? Hypertension   ? Pneumonia 1990's X 2; 2013  ? Type II diabetes mellitus ([place])   ? ? ?Blood pressure 126/78, pulse 78, height '5\' 4"'  (1.626 m), weight 247 lb 9.6 oz (112.3 kg). ? ?Essential hypertension ?Patient with essential hypertension, still not at BP goal.  Home readings still averaging 140-141.  Patient determined to make lifestyle changes in order to reduce medication dependence.   Advised that she would do better to start amlodipine now and thus have more incentive to make those changes.  Will start amlodipine 5 mg daily and see patient back in one month.  Also put in referral to endocrinology, as patient has had problems with multiple DM medications and is on insulin alone at this time.   ? ? ?Tommy Medal PharmD CPP [facility] ?[place] [address] ?[place], [place] [address] ?[phone] ?

## 2022-01-20 ENCOUNTER — Telehealth: Payer: Self-pay

## 2022-01-20 NOTE — Telephone Encounter (Signed)
Voicemail box full; sent text message; she returned my call; assessment visit scheduled for 4/12 at 4pm.  ?

## 2022-01-26 NOTE — Progress Notes (Signed)
[facility] PREP Evaluation ? ?Patient Details  ?Name: Jessica Pace ?MRN: [id number] ?Date of Birth: [birth year] ?Age: 61 y.o. ?PCP: Ladell Pier, MD ? ?Vitals:  ? 01/26/22 1637  ?BP: 128/70  ?Pulse: 74  ?SpO2: 98%  ?Weight: 249 lb 6.4 oz (113.1 kg)  ? ? ? [facility] Eval - 01/26/22 1600   ? ?  ? [facility]PREP[facility] Location  ? [name]   ?  ? Referral   ? Referring Provider Oval Linsey   ? Reason for referral Diabetes;Hypertension;Inactivity   ? Program Start Date 01/31/22   ?  ? Measurement  ? Waist Circumference 53 inches   ? Hip Circumference 50 inches   ? Body fat --   E3  ?  ? Information for Trainer  ? Goals --   Increase stamina, establish exercise routine, lose inches  ? Current Exercise none   ? Pertinent Medical History HTN, diabetes   ?  ? Timed Up and Go (TUGS)  ? Timed Up and Go Low risk <9 seconds   ?  ? Mobility and Daily Activities  ? I find it easy to walk up or down two or more flights of stairs. 2   ? I have no trouble taking out the trash. 4   ? I do housework such as vacuuming and dusting on my own without difficulty. 4   ? I can easily lift a gallon of milk (8lbs). 4   ? I can easily walk a mile. 3   ? I have no trouble reaching into high cupboards or reaching down to pick up something from the floor. 2   ? I do not have trouble doing out-door work such as Armed forces logistics/support/administrative officer, raking leaves, or gardening. 1   ?  ? Mobility and Daily Activities  ? I feel younger than my age. 3   ? I feel independent. 4   ? I feel energetic. 3   ? I live an active life.  2   ? I feel strong. 2   ? I feel healthy. 3   ? I feel active as other people my age. 3   ?  ? How fit and strong are you.  ? Fit and Strong Total Score 40   ? ?  ?  ? ?  ? ?Past Medical History:  ?Diagnosis Date  ? Complication of anesthesia   ? "takes me a long time to come out" (09/05/2016)  ? Hypertension   ? Pneumonia 1990's X 2; 2013  ? Type II diabetes mellitus ([place])   ? ?Past Surgical History:  ?Procedure Laterality Date  ?  ABDOMINAL HYSTERECTOMY  1998  ? CARPAL TUNNEL RELEASE Right ~ 2012  ? TUBAL LIGATION  1994  ? ?Social History  ? ?Tobacco Use  ?Smoking Status Never  ?Smokeless Tobacco Never  ?To begin PREP class at [facility] April 17, every M/W 4-5:15 ? ?[place] ?01/26/2022, 4:40 PM ? ? ?

## 2022-02-01 ENCOUNTER — Encounter (HOSPITAL_BASED_OUTPATIENT_CLINIC_OR_DEPARTMENT_OTHER): Payer: Self-pay | Admitting: Cardiovascular Disease

## 2022-02-01 NOTE — Assessment & Plan Note (Signed)
BP has been uncontrolled.  We will increase olmesartan to '40mg'$  and HCTZ to '25mg'$ .  Will give in combination tablet to minimize pill burden.  Check BMP in a week.  She consents to monitoring in our remote patient monitoring study and to using the [place] RPM system.  She will work on increasing her exercise to at least 150 min/week.  She will enroll in the PREP education/exercise program.  She will benefit from lifestyle coaching from our [place], Amy. ?

## 2022-02-01 NOTE — Assessment & Plan Note (Signed)
Keep working on diet and exercise

## 2022-02-08 NOTE — Progress Notes (Signed)
[facility] PREP Weekly Session ? ?Patient Details  ?Name: Jessica Pace ?MRN: [id number] ?Date of Birth: 1961/09/05 ?Age: 61 y.o. ?PCP: Ladell Pier, MD ? ?There were no vitals filed for this visit. ? ? [facility] Weekly seesion - 02/08/22 0900   ? ?  ? [facility]PREP[facility] Location  ? [name]   ?  ? Weekly Session  ? Topic Discussed Goal setting and welcome to the program   Introductions, review of PREP workbook, tour of facility, option for intro to cardio machines  ? ?  ?  ? ?  ? ? ?[name] ?02/08/2022, 9:03 AM ? ? ?

## 2022-02-16 ENCOUNTER — Ambulatory Visit (INDEPENDENT_AMBULATORY_CARE_PROVIDER_SITE_OTHER): Payer: 59 | Admitting: Pharmacist Clinician (PhC)/ Clinical Pharmacy Specialist

## 2022-02-16 ENCOUNTER — Encounter (HOSPITAL_BASED_OUTPATIENT_CLINIC_OR_DEPARTMENT_OTHER): Payer: Self-pay | Admitting: Pharmacist Clinician (PhC)/ Clinical Pharmacy Specialist

## 2022-02-16 DIAGNOSIS — E1169 Type 2 diabetes mellitus with other specified complication: Secondary | ICD-10-CM

## 2022-02-16 DIAGNOSIS — I10 Essential (primary) hypertension: Secondary | ICD-10-CM | POA: Diagnosis not present

## 2022-02-16 NOTE — Progress Notes (Signed)
? ? ? ?02/16/2022 ?Jessica Pace ?1960/11/21 ?[id number] ? ? ?HPI:  Jessica Pace is a 61 y.o. female patient of Dr Oval Linsey, with a [place] below who presents today for hypertension clinic evaluation.  She was seen by Dr. Oval Linsey in March, at which time her pressure was noted to be 162/81.  At that visit doses of olmesartan and hctz were both increased and prescribed in a single 40/25 mg tablet.  Patient was agreeable to our remote monitoring research and randomized to group 1.  At her last visit she was resistant to taking more medications, adamant that she would make lifestyle modifications.  Suggested that we go ahead and start medication to prevent long term sequale and then she would have a better motivation to make the lifestyle changes.   ? ?She returns today for follow up.  Since our last visit she has started with the PREP exercise program and is now in her second week.  So far she is enjoying the class and notes that after class last week she had one of her best BP readings.   ? ?Past Medical History: ?hyperlipidemia 2/23 LDL 109, no medications  ?obesity BMI 42.31  ?hypothyroidism 2/23 TSH 1.32 - on levothyroxine 75 mcg  ?DM2 A1c 9.4 - on novolog 70/30  ?  ? ?Blood Pressure Goal:  130/80 ? ?Current Medications: olmesartan hctz 40/25 mg qd, amlodipine 5 mg qd (opposite times of day) ? ?Family Hx: father with CHF died at 58; mother DM; 2 brothers both with hypertension; 2 daughters both healthy ? ?Social Hx: no tobacco, occasional glass of wine; had decreased caffeine, down to 1 8oz coffee in am, quit soft drinks ? ?Diet: mix of eating and home/made cabbage - kimchi, good mix of vegetables fresh or frozen, avoids canned; mix of proteins - mostly fish; doesn't add salt; stopped eating out mostly , when does gets salads ? ?Exercise: started walking more at work, started [employer] last week   ? ?Home BP readings:  ? AM: 16 readings average  135/77 (range 121-159/70-89)  previous average 141/78   ?PM: 18 readings  average  132/72 (range 115-152/64-79)  previous average 140/76  ? ?Intolerances: no cardiac meds, multiple DM medications ? ?Labs: 12/27/21: Na 137, K 4.5, Glu 181, BN 21, SCr 1.0, GFR 64 ? ? ?Wt Readings from Last 3 Encounters:  ?02/16/22 249 lb (112.9 kg)  ?01/26/22 249 lb 6.4 oz (113.1 kg)  ?01/19/22 247 lb 9.6 oz (112.3 kg)  ? ?BP Readings from Last 3 Encounters:  ?02/16/22 122/79  ?01/26/22 128/70  ?01/19/22 126/78  ? ?Pulse Readings from Last 3 Encounters:  ?02/16/22 73  ?01/26/22 74  ?01/19/22 78  ? ? ?Current Outpatient Medications  ?Medication Sig Dispense Refill  ? albuterol (VENTOLIN HFA) 108 (90 Base) MCG/ACT inhaler Inhale 2 puffs into the lungs every 6 (six) hours as needed for wheezing or shortness of breath. 8 g 2  ? amLODipine (NORVASC) 5 MG tablet Take 1 tablet (5 mg total) by mouth daily. 30 tablet 3  ? Ascorbic Acid (VITAMIN C) 1000 MG tablet Take 1,000 mg by mouth daily.    ? Cod Liver Oil CAPS Take 1 capsule by mouth every morning.    ? GARLIC OIL PO Take 1 tablet by mouth daily.    ? Glucose Blood (BLOOD GLUCOSE TEST STRIPS) STRP Test twice a day. Pt uses one touch verio meter 100 each 5  ? Insulin Pen Needle (PEN NEEDLES) 32G X 4 MM MISC 1 each by  Other route daily. Use with Basalgar 30 each 0  ? levothyroxine (SYNTHROID) 75 MCG tablet Take 75 mcg by mouth every morning.    ? NOVOLOG MIX 70/30 FLEXPEN (70-30) 100 UNIT/ML FlexPen 64 units with breakfast, 34 units with lunch and 64 units with dinner subcut 15 mL 6  ? olmesartan-hydrochlorothiazide (BENICAR HCT) 40-25 MG tablet Take 1 tablet by mouth daily. 90 tablet 3  ? ONETOUCH DELICA LANCETS FINE MISC Test twice a daily. Pt uses a one touch verio meter 100 each 5  ? solifenacin (VESICARE) 5 MG tablet Take 1 tablet (5 mg total) by mouth daily. 30 tablet 4  ? Blood Glucose Monitoring Suppl (ACCU-CHEK GUIDE ME) w/Device KIT See admin instructions.    ? ?No current facility-administered medications for this visit.  ? ? ?Allergies  ?Allergen  Reactions  ? Canagliflozin   ?  Other reaction(s): yeast  ? Dulaglutide Other (See Comments)  ?  Patient reports it caused flare of diverticulitis.  ? Insulin Degludec Other (See Comments)  ? Metformin Hcl Diarrhea and Other (See Comments)  ? Butyl Acetate (N-Butyl Acetate) Other (See Comments)  ?  In paints and nail polish- unknown  ? ? ?Past Medical History:  ?Diagnosis Date  ? Complication of anesthesia   ? "takes me a long time to come out" (09/05/2016)  ? Hypertension   ? Pneumonia 1990's X 2; 2013  ? Type II diabetes mellitus ([place])   ? ? ?Blood pressure 122/79, pulse 73, height 5' 4" (1.626 m), weight 249 lb (112.9 kg). ? ?Essential hypertension ?Patient with essential hypertension, currently doing better with addition of amlodipine.  She has recently started PREP exercise and notes that after classes she had some of her best readings.  Discussed benefit of regular exercise in lowering BP.  Will continue with current medications at this time.  ? ?Type 2 diabetes mellitus with morbid obesity ([place]) ?Referral was placed last month for patient in endocrinology.  Currently on insulin, but no regular follow up.  Discussed risk/benefit of Ozempic and asked patient think about this as an option.  Chart does indicate that she took Trulicity at one point and believed it to be cause of diverticulitis episode.  Will discuss further at next appointment if not seen by endocrinology first.  ? ? ? ?Tommy Medal PharmD CPP [facility] ?[address] ?[place], [place] [address] ?[phone] ?

## 2022-02-16 NOTE — Assessment & Plan Note (Signed)
Patient with essential hypertension, currently doing better with addition of amlodipine.  She has recently started PREP exercise and notes that after classes she had some of her best readings.  Discussed benefit of regular exercise in lowering BP.  Will continue with current medications at this time.  ?

## 2022-02-16 NOTE — Assessment & Plan Note (Signed)
Referral was placed last month for patient in endocrinology.  Currently on insulin, but no regular follow up.  Discussed risk/benefit of Ozempic and asked patient think about this as an option.  Chart does indicate that she took Trulicity at one point and believed it to be cause of diverticulitis episode.  Will discuss further at next appointment if not seen by endocrinology first.  ?

## 2022-02-16 NOTE — Patient Instructions (Signed)
?  Check your blood pressure at home daily and keep record of the readings. ? ?Take your BP meds as follows:  ? Continue with your current medications. ? ?Look up information on Ozempic (semaglutide)  as a potential medication to help with diabetes as well as weight loss ? ?Bring all of your meds, your BP cuff and your record of home blood pressures to your next appointment.  Exercise as you?re able, try to walk approximately 30 minutes per day.  Keep salt intake to a minimum, especially watch canned and prepared boxed foods.  Eat more fresh fruits and vegetables and fewer canned items.  Avoid eating in fast food restaurants.  ? ? HOW TO TAKE YOUR BLOOD PRESSURE: ?Rest 5 minutes before taking your blood pressure. ? Don?t smoke or drink caffeinated beverages for at least 30 minutes before. ?Take your blood pressure before (not after) you eat. ?Sit comfortably with your back supported and both feet on the floor (don?t cross your legs). ?Elevate your arm to heart level on a table or a desk. ?Use the proper sized cuff. It should fit smoothly and snugly around your bare upper arm. There should be enough room to slip a fingertip under the cuff. The bottom edge of the cuff should be 1 inch above the crease of the elbow. ?Ideally, take 3 measurements at one sitting and record the average. ? ? ?

## 2022-02-24 ENCOUNTER — Other Ambulatory Visit: Payer: Self-pay | Admitting: Internal Medicine

## 2022-02-24 DIAGNOSIS — N3946 Mixed incontinence: Secondary | ICD-10-CM

## 2022-02-24 NOTE — Telephone Encounter (Signed)
Requested Prescriptions  ?Pending Prescriptions Disp Refills  ?? solifenacin (VESICARE) 5 MG tablet [Pharmacy Med Name: SOLIFENACIN 5 MG TABLET] 90 tablet 0  ?  Sig: TAKE 1 TABLET (5 MG TOTAL) BY MOUTH DAILY.  ?  ? Urology:  Bladder Agents 2 Failed - 02/24/2022  2:19 AM  ?  ?  Failed - ALT in normal range and within 360 days  ?  ALT  ?Date Value Ref Range Status  ?11/29/2021 74 (H) 0 - 32 IU/L Final  ?   ?  ?  Failed - AST in normal range and within 360 days  ?  AST  ?Date Value Ref Range Status  ?11/29/2021 44 (H) 0 - 40 IU/L Final  ?   ?  ?  Passed - Cr in normal range and within 360 days  ?  Creat  ?Date Value Ref Range Status  ?01/18/2017 0.90 0.50 - 1.05 mg/dL Final  ?  Comment:  ?    ?For patients > or = 63 years of age: The upper reference limit for ?Creatinine is approximately 13% higher for people identified as ?African-American. ?  ?  ? ?Creatinine, Ser  ?Date Value Ref Range Status  ?12/27/2021 1.00 0.57 - 1.00 mg/dL Final  ? ?Creatinine, POC  ?Date Value Ref Range Status  ?09/01/2015 162.7 mg/dL Final  ? ?Creatinine, Urine  ?Date Value Ref Range Status  ?02/29/2016 168 20 - 320 mg/dL Final  ?   ?  ?  Passed - Valid encounter within last 12 months  ?  Recent Outpatient Visits   ?      ? 2 months ago Type 2 diabetes mellitus with morbid obesity ([place])  ? [place] Ladell Pier, MD  ?  ?  ?Future Appointments   ?        ? In 5 days Erin Fulling Cheryle Horsfall, MD [facility]  ? In 1 month Ladell Pier, MD [place]  ? In 1 month  [facility]  ? In 2 months Skeet Latch, MD [facility]  ?  ? ?  ?  ?  ? ? ?

## 2022-03-01 ENCOUNTER — Encounter: Payer: Self-pay | Admitting: Pulmonary Disease

## 2022-03-01 ENCOUNTER — Ambulatory Visit (INDEPENDENT_AMBULATORY_CARE_PROVIDER_SITE_OTHER): Payer: 59 | Admitting: Pulmonary Disease

## 2022-03-01 VITALS — BP 118/70 | HR 74 | Ht 64.0 in | Wt 247.6 lb

## 2022-03-01 DIAGNOSIS — R0683 Snoring: Secondary | ICD-10-CM

## 2022-03-01 DIAGNOSIS — J984 Other disorders of lung: Secondary | ICD-10-CM

## 2022-03-01 DIAGNOSIS — J683 Other acute and subacute respiratory conditions due to chemicals, gases, fumes and vapors: Secondary | ICD-10-CM

## 2022-03-01 LAB — PULMONARY FUNCTION TEST
DL/VA % pred: 154 %
DL/VA: 6.5 ml/min/mmHg/L
DLCO cor % pred: 86 %
DLCO cor: 17.3 ml/min/mmHg
DLCO unc % pred: 86 %
DLCO unc: 17.3 ml/min/mmHg
FEF 25-75 Post: 2.52 L/sec
FEF 25-75 Pre: 2.02 L/sec
FEF2575-%Change-Post: 24 %
FEF2575-%Pred-Post: 126 %
FEF2575-%Pred-Pre: 101 %
FEV1-%Change-Post: 22 %
FEV1-%Pred-Post: 71 %
FEV1-%Pred-Pre: 58 %
FEV1-Post: 1.46 L
FEV1-Pre: 1.2 L
FEV1FVC-%Change-Post: 7 %
FEV1FVC-%Pred-Pre: 110 %
FEV6-%Change-Post: 13 %
FEV6-%Pred-Post: 61 %
FEV6-%Pred-Pre: 54 %
FEV6-Post: 1.56 L
FEV6-Pre: 1.37 L
FEV6FVC-%Pred-Post: 103 %
FEV6FVC-%Pred-Pre: 103 %
FVC-%Change-Post: 13 %
FVC-%Pred-Post: 59 %
FVC-%Pred-Pre: 52 %
FVC-Post: 1.56 L
FVC-Pre: 1.37 L
Post FEV1/FVC ratio: 94 %
Post FEV6/FVC ratio: 100 %
Pre FEV1/FVC ratio: 87 %
Pre FEV6/FVC Ratio: 100 %
RV % pred: -2 %
RV: -0.04 L
TLC % pred: 41 %
TLC: 2.09 L

## 2022-03-01 MED ORDER — FLUTICASONE-SALMETEROL 250-50 MCG/ACT IN AEPB: 1.0000 | INHALATION_SPRAY | Freq: Two times a day (BID) | RESPIRATORY_TRACT | 6 refills | Status: DC

## 2022-03-01 NOTE — Progress Notes (Signed)
Synopsis: Referred in March 2023 for shortness of breath/wheezing by Karle Plumber, MD  Subjective:   PATIENT ID: Jessica Pace GENDER: female DOB: 11-18-1960, MRN: [id number]  HPI  Chief Complaint  Patient presents with   Follow-up    F/U after PFT. States her breathing has improved since her BP has improved as well.    Jessica Pace is a 61 year old woman, never smoker with hypertension and DMII who returns to pulmonary clinic for shortness of breath and wheezing.   She was started on advair 115-45mg 2 puffs twice daily at last visit. She ran out of it a few weeks ago. She thought it helped somewhat.   Her breathing is improved and she thinks it is better because her blood pressure is better.   A little cough due to phlegm in the throat. Minimal wheezing when she is walking up inclines.   PFTs show moderate restriction.  OV 12/29/21 Patient reports having increased shortness of breath, cough and wheezing over the past 2 years and more specifically since she had COVID-19 infection in December 2022.  He has been using as needed albuterol with some relief.  She denies any sinus congestion or postnasal drainage.  She denies any heartburn or reflux symptoms.  She is a never smoker and denies any secondhand smoke exposure.  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  She previously used maintenance inhalers and montelukast with improvement of her symptoms.  Past Medical History:  Diagnosis Date   Complication of anesthesia    "takes me a long time to come out" (09/05/2016)   Hypertension    Pneumonia 156'sX 2; 2013   [place] II diabetes [place]      Family History  Problem Relation Age of Onset   Diabetes Mother    Hyperlipidemia Father    Hypertension Father    Heart failure Maternal Grandmother    Colon cancer Neg Hx    Stomach  cancer Neg Hx    Rectal cancer Neg Hx    Esophageal cancer Neg Hx    Liver cancer Neg Hx    Breast cancer Neg Hx      Social History   Socioeconomic History   Marital status: Legally Separated    Spouse name: Not on file   Number of children: 2   Years of education: Not on file   Highest education level: Not on file  Occupational History   Occupation: EVisual merchandiser Tobacco Use   Smoking status: Never   Smokeless tobacco: Never  Substance and Sexual Activity   Alcohol use: No   Drug use: No   Sexual activity: Not Currently    Birth control/protection: Surgical  Other Topics Concern   Not on file  Social History Narrative   Not on file   Social Determinants of Health   Financial Resource Strain: Low Risk    Difficulty of Paying Living Expenses: Not hard at all  Food Insecurity: No Food Insecurity   Worried About RCharity fundraiserin the Last Year: Never true   Ran Out of Food in the Last Year: Never true  Transportation Needs: No Transportation Needs   Lack of Transportation (Medical): No   Lack of Transportation (Non-Medical): No  Physical Activity: Insufficiently Active   Days of Exercise per Week: 5 days   Minutes of Exercise per Session: 20 min  Stress: Not on file  Social Connections: Not on file  Intimate Partner Violence: Not on file     Allergies  Allergen Reactions   Canagliflozin     Other reaction(s): yeast   Dulaglutide Other (See Comments)    Patient reports it caused flare of diverticulitis.   Insulin Degludec Other (See Comments)   Metformin Hcl Diarrhea and Other (See Comments)   Butyl Acetate (N-Butyl Acetate) Other (See Comments)    In paints and nail polish- unknown     Outpatient Medications Prior to Visit  Medication Sig Dispense Refill   albuterol (VENTOLIN HFA) 108 (90 Base) MCG/ACT inhaler Inhale 2 puffs into the lungs every 6 (six) hours as needed for wheezing or shortness of breath. 8 g 2   amLODipine (NORVASC) 5 MG  tablet Take 1 tablet (5 mg total) by mouth daily. 30 tablet 3   Ascorbic Acid (VITAMIN C) 1000 MG tablet Take 1,000 mg by mouth daily.     Blood Glucose Monitoring Suppl (ACCU-CHEK GUIDE ME) w/Device KIT See admin instructions.     Cod Liver Oil CAPS Take 1 capsule by mouth every morning.     GARLIC OIL PO Take 1 tablet by mouth daily.     Glucose Blood (BLOOD GLUCOSE TEST STRIPS) STRP Test twice a day. Pt uses one touch verio meter 100 each 5   Insulin Pen Needle (PEN NEEDLES) 32G X 4 MM MISC 1 each by Other route daily. Use with Basalgar 30 each 0   levothyroxine (SYNTHROID) 75 MCG tablet Take 75 mcg by mouth every morning.     NOVOLOG MIX 70/30 FLEXPEN (70-30) 100 UNIT/ML FlexPen 64 units with breakfast, 34 units with lunch and 64 units with dinner subcut 15 mL 6   olmesartan-hydrochlorothiazide (BENICAR HCT) 40-25 MG tablet Take 1 tablet by mouth daily. 90 tablet 3   ONETOUCH DELICA LANCETS FINE MISC Test twice a daily. Pt uses a one touch verio meter 100 each 5   solifenacin (VESICARE) 5 MG tablet TAKE 1 TABLET (5 MG TOTAL) BY MOUTH DAILY. 90 tablet 0   No facility-administered medications prior to visit.   Review of Systems  Constitutional:  Negative for chills, fever, malaise/fatigue and weight loss.  HENT:  Negative for congestion, sinus pain and sore throat.   Eyes: Negative.   Respiratory:  Positive for cough and shortness of breath. Negative for hemoptysis, sputum production and wheezing.   Cardiovascular:  Negative for chest pain, palpitations, orthopnea, claudication and leg swelling.  Gastrointestinal:  Negative for abdominal pain, heartburn, nausea and vomiting.  Genitourinary: Negative.   Musculoskeletal:  Positive for joint pain. Negative for myalgias.  Skin:  Negative for rash.  Neurological:  Negative for weakness.  Endo/Heme/Allergies: Negative.   Psychiatric/Behavioral: Negative.     Objective:   Vitals:   03/01/22 1519  BP: 118/70  Pulse: 74  SpO2: 96%   Weight: 247 lb 9.6 oz (112.3 kg)  Height: '5\' 4"'  (1.626 m)   Physical Exam Constitutional:      General: She is not in acute distress.    Appearance: She is obese. She is not ill-appearing.  HENT:     Head: Normocephalic and atraumatic.  Eyes:     General: No scleral icterus.    Conjunctiva/sclera: Conjunctivae normal.  Cardiovascular:     Rate and Rhythm: Normal rate and regular rhythm.     Pulses: Normal pulses.     Heart sounds: Normal heart sounds. No murmur heard. Pulmonary:  Effort: Pulmonary effort is normal.     Breath sounds: Normal breath sounds. No wheezing, rhonchi or rales.  Musculoskeletal:     Right lower leg: No edema.     Left lower leg: No edema.  Skin:    General: Skin is warm and dry.  Neurological:     General: No focal deficit present.     Mental Status: She is alert.  Psychiatric:        Mood and Affect: Mood normal.        Behavior: Behavior normal.        Thought Content: Thought content normal.        Judgment: Judgment normal.    CBC    Component Value Date/Time   WBC 6.2 11/29/2021 1146   WBC 6.3 09/07/2019 1037   RBC 4.48 11/29/2021 1146   RBC 4.60 09/07/2019 1037   HGB 12.7 11/29/2021 1146   HCT 38.1 11/29/2021 1146   PLT 213 11/29/2021 1146   MCV 85 11/29/2021 1146   MCH 28.3 11/29/2021 1146   MCH 28.7 09/07/2019 1037   MCHC 33.3 11/29/2021 1146   MCHC 33.2 09/07/2019 1037   RDW 14.7 11/29/2021 1146   LYMPHSABS 1.7 09/07/2019 1037   LYMPHSABS 2.2 05/28/2018 1539   MONOABS 0.4 09/07/2019 1037   EOSABS 0.1 09/07/2019 1037   EOSABS 0.1 05/28/2018 1539   BASOSABS 0.0 09/07/2019 1037   BASOSABS 0.0 05/28/2018 1539      Latest Ref Rng & Units 12/27/2021    9:40 AM 11/29/2021   11:46 AM 09/07/2019   10:37 AM  BMP  Glucose 70 - 99 mg/dL 181   71   304    BUN 8 - 27 mg/dL '21   13   11    ' Creatinine 0.57 - 1.00 mg/dL 1.00   0.83   0.78    BUN/Creat Ratio 12 - '28 21   16     ' Sodium 134 - 144 mmol/L 137   142   131    Potassium  3.5 - 5.2 mmol/L 4.5   4.3   4.4    Chloride 96 - 106 mmol/L 99   103   98    CO2 20 - 29 mmol/L '22   26   22    ' Calcium 8.7 - 10.3 mg/dL 9.7   9.7   9.0     Chest imaging: CXR 07/30/21 Lung volumes are low. Diffuse peribronchial cuffing. No consolidative airspace disease. No pleural effusions. No pneumothorax. No pulmonary nodule or mass noted. Pulmonary vasculature and the cardiomediastinal silhouette are within normal limits.  PFT:    Latest Ref Rng & Units 03/01/2022    2:13 PM  PFT Results  FVC-Pre L 1.37    FVC-Predicted Pre % 52    FVC-Post L 1.56    FVC-Predicted Post % 59    Pre FEV1/FVC % % 87    Post FEV1/FCV % % 94    FEV1-Pre L 1.20    FEV1-Predicted Pre % 58    FEV1-Post L 1.46    DLCO uncorrected ml/min/mmHg 17.30    DLCO UNC% % 86    DLCO corrected ml/min/mmHg 17.30    DLCO COR %Predicted % 86    DLVA Predicted % 154    TLC L 2.09    TLC % Predicted % 41    RV % Predicted % -2      Labs:  Path:  Echo:  Heart Catheterization:  Assessment & Plan:  Reactive airways dysfunction syndrome (HCC) - Plan: fluticasone-salmeterol (ADVAIR DISKUS) 250-50 MCG/ACT AEPB, Home sleep test  Restrictive lung disease - Plan: CT CHEST HIGH RESOLUTION  Snoring - Plan: Home sleep test  Discussion: Jessica Pace is a 61 year old woman, never smoker with hypertension and DMII who returns to pulmonary clinic for shortness of breath and wheezing.   Her shortness of breath is improved which she mainly attributes to her blood pressure being under better control. She did notice some benefit from the advair 115-39mg inhaler. She reports the inhaler was too expensive though. We will send in prescription for advair diskus 250-543m 1 puff twice daily. She can continue as needed albuterol.  Her PFTs show moderate restriction which could be due to her weight.   We will schedule her for a sleep study for concern of sleep apnea given her hypertension, obesity and reactive  airways disease.   She is to follow-up in 3 months.  JoFreda JacksonMD [facility] Office: [phone] Current Outpatient Medications:    albuterol (VENTOLIN HFA) 108 (90 Base) MCG/ACT inhaler, Inhale 2 puffs into the lungs every 6 (six) hours as needed for wheezing or shortness of breath., Disp: 8 g, Rfl: 2   amLODipine (NORVASC) 5 MG tablet, Take 1 tablet (5 mg total) by mouth daily., Disp: 30 tablet, Rfl: 3   Ascorbic Acid (VITAMIN C) 1000 MG tablet, Take 1,000 mg by mouth daily., Disp: , Rfl:    Blood Glucose Monitoring Suppl (ACCU-CHEK GUIDE ME) w/Device KIT, See admin instructions., Disp: , Rfl:    Cod Liver Oil CAPS, Take 1 capsule by mouth every morning., Disp: , Rfl:    fluticasone-salmeterol (ADVAIR DISKUS) 250-50 MCG/ACT AEPB, Inhale 1 puff into the lungs in the morning and at bedtime., Disp: 60 each, Rfl: 6   GARLIC OIL PO, Take 1 tablet by mouth daily., Disp: , Rfl:    Glucose Blood (BLOOD GLUCOSE TEST STRIPS) STRP, Test twice a day. Pt uses one touch verio meter, Disp: 100 each, Rfl: 5   Insulin Pen Needle (PEN NEEDLES) 32G X 4 MM MISC, 1 each by Other route daily. Use with Basalgar, Disp: 30 each, Rfl: 0   levothyroxine (SYNTHROID) 75 MCG tablet, Take 75 mcg by mouth every morning., Disp: , Rfl:    NOVOLOG MIX 70/30 FLEXPEN (70-30) 100 UNIT/ML FlexPen, 64 units with breakfast, 34 units with lunch and 64 units with dinner subcut, Disp: 15 mL, Rfl: 6   olmesartan-hydrochlorothiazide (BENICAR HCT) 40-25 MG tablet, Take 1 tablet by mouth daily., Disp: 90 tablet, Rfl: 3   ONETOUCH DELICA LANCETS FINE MISC, Test twice a daily. Pt uses a one touch verio meter, Disp: 100 each, Rfl: 5   solifenacin (VESICARE) 5 MG tablet, TAKE 1 TABLET (5 MG TOTAL) BY MOUTH DAILY., Disp: 90 tablet, Rfl: 0

## 2022-03-01 NOTE — Patient Instructions (Signed)
Use advair Diskus 250-9mg 1 puff twice daily ?- rinse mouth after each use ? ?Use albuterol inhaler as needed ? ?We will check CT Chest scan to further evaluate the restrictive defect seen on breathing tests today. ? ?We will check a sleep study to evaluate for sleep apnea ? ?Follow up in 3 months ?

## 2022-03-01 NOTE — Patient Instructions (Signed)
Attempted Full PFT Today. Performed Pre/Post Spirometry and Pleth.  ?

## 2022-03-01 NOTE — Progress Notes (Signed)
Attempted Full PFT Today. Performed Pre/Post Spirometry and Pleth.  ?

## 2022-03-04 ENCOUNTER — Ambulatory Visit: Payer: 59

## 2022-03-04 DIAGNOSIS — R0683 Snoring: Secondary | ICD-10-CM

## 2022-03-04 DIAGNOSIS — G4733 Obstructive sleep apnea (adult) (pediatric): Secondary | ICD-10-CM

## 2022-03-04 DIAGNOSIS — J683 Other acute and subacute respiratory conditions due to chemicals, gases, fumes and vapors: Secondary | ICD-10-CM

## 2022-03-06 ENCOUNTER — Encounter: Payer: Self-pay | Admitting: Pulmonary Disease

## 2022-03-08 ENCOUNTER — Telehealth: Payer: Self-pay

## 2022-03-08 NOTE — Telephone Encounter (Signed)
Received call from patient, very emotionally distressed; has missed last 3 weeks of classes with death/illness in the family; was planning to attend today but has learned that another family member is critically ill; advised to take the time she needed to heal, deal with her grief, and spend time with family; will plan to restart PREP in June/July when she is able and ready.

## 2022-03-15 ENCOUNTER — Ambulatory Visit (INDEPENDENT_AMBULATORY_CARE_PROVIDER_SITE_OTHER)
Admission: RE | Admit: 2022-03-15 | Discharge: 2022-03-15 | Disposition: A | Discharge status: Home / Self Care | Payer: 59 | Source: Ambulatory Visit | Attending: Pulmonary Disease | Admitting: Pulmonary Disease

## 2022-03-15 DIAGNOSIS — J984 Other disorders of lung: Secondary | ICD-10-CM | POA: Diagnosis not present

## 2022-03-16 DIAGNOSIS — G4733 Obstructive sleep apnea (adult) (pediatric): Secondary | ICD-10-CM | POA: Diagnosis not present

## 2022-03-29 ENCOUNTER — Ambulatory Visit: Payer: 59 | Admitting: Internal Medicine

## 2022-03-31 ENCOUNTER — Encounter: Payer: Self-pay | Admitting: Internal Medicine

## 2022-03-31 ENCOUNTER — Ambulatory Visit: Payer: 59 | Attending: Internal Medicine | Admitting: Internal Medicine

## 2022-03-31 DIAGNOSIS — R7989 Other specified abnormal findings of blood chemistry: Secondary | ICD-10-CM | POA: Diagnosis not present

## 2022-03-31 DIAGNOSIS — I700 Atherosclerosis of aorta: Secondary | ICD-10-CM

## 2022-03-31 DIAGNOSIS — E7800 Pure hypercholesterolemia, unspecified: Secondary | ICD-10-CM | POA: Diagnosis not present

## 2022-03-31 DIAGNOSIS — E1169 Type 2 diabetes mellitus with other specified complication: Secondary | ICD-10-CM

## 2022-03-31 DIAGNOSIS — Z6841 Body Mass Index (BMI) 40.0 and over, adult: Secondary | ICD-10-CM

## 2022-03-31 DIAGNOSIS — Z23 Encounter for immunization: Secondary | ICD-10-CM

## 2022-03-31 DIAGNOSIS — I152 Hypertension secondary to endocrine disorders: Secondary | ICD-10-CM

## 2022-03-31 DIAGNOSIS — E1159 Type 2 diabetes mellitus with other circulatory complications: Secondary | ICD-10-CM

## 2022-03-31 DIAGNOSIS — Z1231 Encounter for screening mammogram for malignant neoplasm of breast: Secondary | ICD-10-CM

## 2022-03-31 LAB — POCT GLYCOSYLATED HEMOGLOBIN (HGB A1C): HbA1c, POC (controlled diabetic range): 8.9 % — AB (ref 0.0–7.0)

## 2022-03-31 LAB — GLUCOSE, POCT (MANUAL RESULT ENTRY): POC Glucose: 155 mg/dl — AB (ref 70–99)

## 2022-03-31 MED ORDER — OZEMPIC (0.25 OR 0.5 MG/DOSE) 2 MG/3ML ~~LOC~~ SOPN
0.2500 mg | PEN_INJECTOR | SUBCUTANEOUS | 1 refills | Status: DC
Start: 2022-03-31 — End: 2022-08-19

## 2022-03-31 NOTE — Progress Notes (Signed)
Patient ID: Jessica Pace, female    DOB: 1960/11/23  MRN: [id number]  CC: Diabetes   Subjective: Jessica Pace is a 61 y.o. female who presents for chronic ds management Her concerns today include:  Patient with history of DM type II, HTN, hypothyroid, obesity, uterine CA (s/p hysterectomy 1998), vitamin D deficiency, venous reflux  Since last visit with me, she has seen the pulmonologist Dr. Erin Fulling.  Diagnosed with reactive airway dysfunction syndrome.  Placed on Advair inhaler.  She has found this somewhat helpful.  High-resolution CT of the chest negative for ILD changes.  Incidental finding of right upper/right lower lobe nodules.  Radiologist recommended repeat study in 1 year.  Incidental finding of aortic atherosclerosis. -Home sleep study ordered to evaluate for possible sleep apnea.  HL: LDL level was 109 on last visit.  Goal is to be less than 70 in patients with diabetes.  I did not start statin therapy.  Mild elevation in AST/ALT of 44/74.  Screen for hepatitis B and C-.  DM: Results for orders placed or performed in visit on 03/31/22  POCT glucose (manual entry)  Result Value Ref Range   POC Glucose 155 (A) 70 - 99 mg/dl  POCT glycosylated hemoglobin (Hb A1C)  Result Value Ref Range   Hemoglobin A1C     HbA1c POC (<> result, manual entry)     HbA1c, POC (prediabetic range)     HbA1c, POC (controlled diabetic range) 8.9 (A) 0.0 - 7.0 %  On Novolog 70/30 64/34/64 Referred to endocrinology on last visit.  Appointment not until December. Reports her cardiologist Dr. Oval Linsey told her to ask me about putting her on Ozempic.  On last visit she indicated that she was intolerant of metformin, SGLT and Trulicity.  Had diarrhea and diverticulitis with Trulicity Not exercising as much over the past several weeks due to depth of one of her children's dad.   Doing good with eating habits Checking BS BID.  Range BF before 130-140s, before dinner 150-160  HTN: HCTZ 12.5 mg added  to Benicar on last visit.  Since then cardiology has changed her to combination Benicar/HCTZ 40 mg / 25 mg.  Checking BP.  Range this a.m 120/68, last night 132/72  HM:  due for MMG and shingrix I told her that I had asked one of my gynecology colleagues whether she still needed Pap smears since history of uterine cancer in 1998 and he said that she no longer needed that. Patient Active Problem List   Diagnosis Date Noted   Mixed incontinence 11/29/2021   History of cancer of uterine body 06/14/2021   Hypothyroidism 06/14/2021   Pulmonary nodule 06/14/2021   Elevated LFTs 09/13/2016   Diverticulitis 09/04/2016   Type 2 diabetes mellitus with morbid obesity ([place]) 11/02/2015   History of hypothyroidism 09/01/2015   Obesity (BMI 30-39.9) 08/10/2015   Diabetes type 2, uncontrolled 08/10/2015   Essential hypertension 08/10/2015   History of vitamin D deficiency 08/10/2015   History of headache 08/10/2015   Uterine cancer ([place]) 04/07/2012   Mixed hyperlipidemia 02/28/2012   Vitamin D deficiency 02/28/2012     Current Outpatient Medications on File Prior to Visit  Medication Sig Dispense Refill   albuterol (VENTOLIN HFA) 108 (90 Base) MCG/ACT inhaler Inhale 2 puffs into the lungs every 6 (six) hours as needed for wheezing or shortness of breath. 8 g 2   amLODipine (NORVASC) 5 MG tablet Take 1 tablet (5 mg total) by mouth daily. 30 tablet 3  Ascorbic Acid (VITAMIN C) 1000 MG tablet Take 1,000 mg by mouth daily.     Blood Glucose Monitoring Suppl (ACCU-CHEK GUIDE ME) w/Device KIT See admin instructions.     Cod Liver Oil CAPS Take 1 capsule by mouth every morning.     fluticasone-salmeterol (ADVAIR DISKUS) 250-50 MCG/ACT AEPB Inhale 1 puff into the lungs in the morning and at bedtime. 60 each 6   GARLIC OIL PO Take 1 tablet by mouth daily.     Glucose Blood (BLOOD GLUCOSE TEST STRIPS) STRP Test twice a day. Pt uses one touch verio meter 100 each 5   Insulin Pen Needle (PEN NEEDLES) 32G X 4  MM MISC 1 each by Other route daily. Use with Basalgar 30 each 0   levothyroxine (SYNTHROID) 75 MCG tablet Take 75 mcg by mouth every morning.     NOVOLOG MIX 70/30 FLEXPEN (70-30) 100 UNIT/ML FlexPen 64 units with breakfast, 34 units with lunch and 64 units with dinner subcut 15 mL 6   olmesartan-hydrochlorothiazide (BENICAR HCT) 40-25 MG tablet Take 1 tablet by mouth daily. 90 tablet 3   ONETOUCH DELICA LANCETS FINE MISC Test twice a daily. Pt uses a one touch verio meter 100 each 5   solifenacin (VESICARE) 5 MG tablet TAKE 1 TABLET (5 MG TOTAL) BY MOUTH DAILY. 90 tablet 0   No current facility-administered medications on file prior to visit.    Allergies  Allergen Reactions   Canagliflozin     Other reaction(s): yeast   Dulaglutide Other (See Comments)    Patient reports it caused flare of diverticulitis.   Insulin Degludec Other (See Comments)   Metformin Hcl Diarrhea and Other (See Comments)   Butyl Acetate (N-Butyl Acetate) Other (See Comments)    In paints and nail polish- unknown    Social History   Socioeconomic History   Marital status: Legally Separated    Spouse name: Not on file   Number of children: 2   Years of education: Not on file   Highest education level: Not on file  Occupational History   Occupation: Visual merchandiser  Tobacco Use   Smoking status: Never   Smokeless tobacco: Never  Substance and Sexual Activity   Alcohol use: No   Drug use: No   Sexual activity: Not Currently    Birth control/protection: Surgical  Other Topics Concern   Not on file  Social History Narrative   Not on file   Social Determinants of Health   Financial Resource Strain: Low Risk  (12/16/2021)   Overall Financial Resource Strain (CARDIA)    Difficulty of Paying Living Expenses: Not hard at all  Food Insecurity: No Food Insecurity (12/16/2021)   Hunger Vital Sign    Worried About Running Out of Food in the Last Year: Never true    [place] in the Last Year:  Never true  Transportation Needs: No Transportation Needs (12/16/2021)   PRAPARE - Hydrologist (Medical): No    Lack of Transportation (Non-Medical): No  Physical Activity: Insufficiently Active (12/16/2021)   Exercise Vital Sign    Days of Exercise per Week: 5 days    Minutes of Exercise per Session: 20 min  Stress: Not on file  Social Connections: Not on file  Intimate Partner Violence: Not on file    Family History  Problem Relation Age of Onset   Diabetes Mother    Hyperlipidemia Father    Hypertension Father    Heart failure  Maternal Grandmother    Colon cancer Neg Hx    Stomach cancer Neg Hx    Rectal cancer Neg Hx    Esophageal cancer Neg Hx    Liver cancer Neg Hx    Breast cancer Neg Hx     Past Surgical History:  Procedure Laterality Date   ABDOMINAL HYSTERECTOMY  1998   CARPAL TUNNEL RELEASE Right ~ 2012   TUBAL LIGATION  1994    ROS: Review of Systems Negative except as stated above  PHYSICAL EXAM: BP 126/78   Pulse 77   Temp 98.3 F (36.8 C) (Oral)   Ht 5' 4" (1.626 m)   Wt 251 lb (113.9 kg)   SpO2 94%   BMI 43.08 kg/m   Wt Readings from Last 3 Encounters:  03/31/22 251 lb (113.9 kg)  03/01/22 247 lb 9.6 oz (112.3 kg)  02/16/22 249 lb (112.9 kg)    Physical Exam  General appearance - alert, well appearing, and in no distress Mental status - normal mood, behavior, speech, dress, motor activity, and thought processes Neck - supple, no significant adenopathy Chest - clear to auscultation, no wheezes, rales or rhonchi, symmetric air entry Heart - normal rate, regular rhythm, normal S1, S2, no murmurs, rubs, clicks or gallops Extremities -trace bilateral lower extremity edema.   The 10-year ASCVD risk score (Arnett DK, et al., 2019) is: 16.6%   Values used to calculate the score:     Age: 49 years     Sex: Female     Is Non-Hispanic African American: Yes     Diabetic: Yes     Tobacco smoker: No     Systolic Blood  Pressure: 126 mmHg     Is BP treated: Yes     HDL Cholesterol: 44 mg/dL     Total Cholesterol: 177 mg/dL     Latest Ref Rng & Units 12/27/2021    9:40 AM 11/29/2021   11:46 AM 09/07/2019   10:37 AM  CMP  Glucose 70 - 99 mg/dL 181  71  304   BUN 8 - 27 mg/dL _0 Creatinine 0.57 - 1.00 mg/dL 1.00  0.83  0.78   Sodium 134 - 144 mmol/L 137  142  131   Potassium 3.5 - 5.2 mmol/L 4.5  4.3  4.4   Chloride 96 - 106 mmol/L 99  103  98   CO2 20 - 29 mmol/L _1 Calcium 8.7 - 10.3 mg/dL 9.7  9.7  9.0   Total Protein 6.0 - 8.5 g/dL  7.5  7.5   Total Bilirubin 0.0 - 1.2 mg/dL  0.4  1.0   Alkaline Phos 44 - 121 IU/L  107  81   AST 0 - 40 IU/L  44  25   ALT 0 - 32 IU/L  74  34    Lipid Panel     Component Value Date/Time   CHOL 177 11/29/2021 1146   TRIG 133 11/29/2021 1146   HDL 44 11/29/2021 1146   CHOLHDL 4.0 11/29/2021 1146   CHOLHDL 7.8 (H) 01/18/2017 1019   VLDL 72 (H) 01/18/2017 1019   LDLCALC 109 (H) 11/29/2021 1146    CBC    Component Value Date/Time   WBC 6.2 11/29/2021 1146   WBC 6.3 09/07/2019 1037   RBC 4.48 11/29/2021 1146   RBC 4.60 09/07/2019 1037   HGB 12.7 11/29/2021 1146   HCT 38.1 11/29/2021 1146  PLT 213 11/29/2021 1146   MCV 85 11/29/2021 1146   MCH 28.3 11/29/2021 1146   MCH 28.7 09/07/2019 1037   MCHC 33.3 11/29/2021 1146   MCHC 33.2 09/07/2019 1037   RDW 14.7 11/29/2021 1146   LYMPHSABS 1.7 09/07/2019 1037   LYMPHSABS 2.2 05/28/2018 1539   MONOABS 0.4 09/07/2019 1037   EOSABS 0.1 09/07/2019 1037   EOSABS 0.1 05/28/2018 1539   BASOSABS 0.0 09/07/2019 1037   BASOSABS 0.0 05/28/2018 1539    ASSESSMENT AND PLAN: 1. Type 2 diabetes mellitus with morbid obesity ([place]) Not at goal. Continue NovoLog 70/30 64/34/64. Advised patient that if she did not tolerate Trulicity, she most likely may not tolerate Ozempic either as they are in the same class.  Looks like Darcel Bayley is not covered by her insurance.  Patient still wants to give a  trial of the Ozempic.  We will have her start low on 0.25 mg weekly for 4 weeks then increase it to the 0.5 mg.  Went over with the patient how the medication works and possible side effects including nausea, vomiting and pancreatitis.  Should she develop any vomiting or pain in the upper abdomen, she should stop the medication and give me a call or be seen in the emergency room.  No prior history of thyroid disease or thyroid cancer.  Continue to monitor blood sugars. Encouraged her to try to return to an exercise routine. - POCT glucose (manual entry) - POCT glycosylated hemoglobin (Hb A1C)  2. Hypertension associated with diabetes ([place]) At goal.  Continue Benicar/HCTZ  3. Abnormal LFTs May be due to fatty liver disease given history of diabetes and obesity.  Recheck LFTs today. - Hepatic Function Panel  4. Pure hypercholesterolemia Ideally should be on statin therapy.  However I would like to recheck LFTs first. - Lipid panel  5. Aortic atherosclerosis ([place]) Went over the diagnosis with patient.  Ideally should be on statin therapy.  6. Need for shingles vaccine Given first Shingrix shot today.  Advised that it can cause some redness and swelling at the injection site that can last for several days.  7. Encounter for screening mammogram for malignant neoplasm of breast - MM Digital Screening; Future    Patient was given the opportunity to ask questions.  Patient verbalized understanding of the plan and was able to repeat key elements of the plan.   This documentation was completed using Radio producer.  Any transcriptional errors are unintentional.  Orders Placed This Encounter  Procedures   MM Digital Screening   Varicella-zoster vaccine IM   Hepatic Function Panel   Lipid panel   POCT glucose (manual entry)   POCT glycosylated hemoglobin (Hb A1C)     Requested Prescriptions   Signed Prescriptions Disp Refills   Semaglutide,0.25 or 0.5MG/DOS,  (OZEMPIC, 0.25 OR 0.5 MG/DOSE,) 2 MG/3ML SOPN 3 mL 1    Sig: Inject 0.25 mg into the skin once a week. For four weeks. If tolerable after 4 weeks, increase to the 0.5 mg weekly dose.    No follow-ups on file.  Karle Plumber, MD, FACP

## 2022-03-31 NOTE — Progress Notes (Signed)
04/05/2022 Jessica Pace January 15, 1961 [id number]   HPI:  Jessica Pace is a 61 y.o. female patient of Dr Oval Linsey, with a Jessica Pace below who presents today for hypertension clinic evaluation.  She was seen by Dr. Oval Linsey in March, at which time her pressure was noted to be 162/81.  At that visit doses of olmesartan and hctz were both increased and prescribed in a single 40/25 mg tablet.  Patient was agreeable to our remote monitoring research and randomized to group 1.  At her last visit she was resistant to taking more medications, adamant that she would make lifestyle modifications.  Suggested that we go ahead and start medication to prevent long term sequale and then she would have a better motivation to make the lifestyle changes.    At our last visit she had started in the PREP exercise class, but had to stop due to the death of her ex-husband.  She admits she didn't realize how much this would affect her, as they were married for 12 years and divorced much longer. [employer] has assured her that she can re-join at the next available class.   Also saw her PCP yesterday, and her A1c was improved to 8.9 (previously 9.4), still waiting for endocrinology appointment.  Today is her 61rd birthday and she feels as though life is starting to reach a new normal for her and her daughters.    Past Medical History: hyperlipidemia 2/23 LDL 109, no medications  obesity BMI 42.31  hypothyroidism 2/23 TSH 1.32 - on levothyroxine 75 mcg  DM2 A1c 9.4 - on novolog 70/30     Blood Pressure Goal:  130/80  Current Medications: olmesartan hctz 40/25 mg qd, amlodipine 5 mg qd (opposite times of day)  Family Hx: father with CHF died at 33; mother DM; 2 brothers both with hypertension; 2 daughters both healthy  Social Hx: no tobacco, occasional glass of wine; had decreased caffeine, down to 1 8oz coffee in am, quit soft drinks  Diet: mix of eating and home/made cabbage - kimchi, good mix of vegetables fresh or  frozen, avoids canned; mix of proteins - mostly fish; doesn't add salt; stopped eating out mostly , when does gets salads  Exercise: started walking more at work, will restart PREP at next available class  Home BP readings:   AM: 21 readings average 131/74 Previous visit averages 135/77 and 141/78   PM: 15 readings average 133/74  Previous visit averages 132/72 and 140/76   Intolerances: no cardiac meds, multiple DM medications  Labs: 12/27/21: Na 137, K 4.5, Glu 181, BN 21, SCr 1.0, GFR 64   Wt Readings from Last 3 Encounters:  03/31/22 251 lb (113.9 kg)  03/01/22 247 lb 9.6 oz (112.3 kg)  02/16/22 249 lb (112.9 kg)   BP Readings from Last 3 Encounters:  04/01/22 131/76  03/31/22 126/78  03/01/22 118/70   Pulse Readings from Last 3 Encounters:  04/01/22 77  03/31/22 77  03/01/22 74    Current Outpatient Medications  Medication Sig Dispense Refill   olmesartan-hydrochlorothiazide (BENICAR HCT) 40-25 MG tablet Take 1 tablet by mouth daily. 90 tablet 3   ONETOUCH DELICA LANCETS FINE MISC Test twice a daily. Pt uses a one touch verio meter 100 each 5   Semaglutide,0.25 or 0.5MG/DOS, (OZEMPIC, 0.25 OR 0.5 MG/DOSE,) 2 MG/3ML SOPN Inject 0.25 mg into the skin once a week. For four weeks. If tolerable after 4 weeks, increase to the 0.5 mg weekly dose. 3 mL 1  solifenacin (VESICARE) 5 MG tablet TAKE 1 TABLET (5 MG TOTAL) BY MOUTH DAILY. 90 tablet 0   albuterol (VENTOLIN HFA) 108 (90 Base) MCG/ACT inhaler Inhale 2 puffs into the lungs every 6 (six) hours as needed for wheezing or shortness of breath. 8 g 2   amLODipine (NORVASC) 5 MG tablet Take 1 tablet (5 mg total) by mouth daily. 30 tablet 3   Ascorbic Acid (VITAMIN C) 1000 MG tablet Take 1,000 mg by mouth daily.     atorvastatin (LIPITOR) 10 MG tablet Take 1 tablet (10 mg total) by mouth daily. 30 tablet 3   Blood Glucose Monitoring Suppl (ACCU-CHEK GUIDE ME) w/Device KIT See admin instructions.     Cod Liver Oil CAPS Take 1  capsule by mouth every morning.     fluticasone-salmeterol (ADVAIR DISKUS) 250-50 MCG/ACT AEPB Inhale 1 puff into the lungs in the morning and at bedtime. 60 each 6   GARLIC OIL PO Take 1 tablet by mouth daily.     Glucose Blood (BLOOD GLUCOSE TEST STRIPS) STRP Test twice a day. Pt uses one touch verio meter 100 each 5   Insulin Pen Needle (PEN NEEDLES) 32G X 4 MM MISC 1 each by Other route daily. Use with Basalgar 30 each 0   levothyroxine (SYNTHROID) 75 MCG tablet Take 75 mcg by mouth every morning.     NOVOLOG MIX 70/30 FLEXPEN (70-30) 100 UNIT/ML FlexPen 64 units with breakfast, 34 units with lunch and 64 units with dinner subcut 15 mL 6   No current facility-administered medications for this visit.    Allergies  Allergen Reactions   Canagliflozin     Other reaction(s): yeast   Dulaglutide Other (See Comments)    Patient reports it caused flare of diverticulitis.   Insulin Degludec Other (See Comments)   Metformin Hcl Diarrhea and Other (See Comments)   Butyl Acetate (N-Butyl Acetate) Other (See Comments)    In paints and nail polish- unknown    Past Medical History:  Diagnosis Date   Complication of anesthesia    "takes me a long time to come out" (09/05/2016)   Hypertension    Pneumonia 1990's X 2; 2013   Type II diabetes mellitus ([facility])     Blood pressure 131/76, pulse 77.  Essential hypertension Patient with essential hypertension, now doing quite well on combination of 2 medications.  Will have her continue current regimen.  She is working on increasing movement throughout the day and will re-join PREP as soon as she is able.  She is scheduled for follow up with Dr. Oval Linsey next month.    Tommy Medal PharmD CPP [facility] [address] [place], [place] [address] [phone]

## 2022-04-01 ENCOUNTER — Ambulatory Visit (INDEPENDENT_AMBULATORY_CARE_PROVIDER_SITE_OTHER): Payer: 59 | Admitting: Pharmacist Clinician (PhC)/ Clinical Pharmacy Specialist

## 2022-04-01 ENCOUNTER — Other Ambulatory Visit: Payer: Self-pay | Admitting: Internal Medicine

## 2022-04-01 DIAGNOSIS — I10 Essential (primary) hypertension: Secondary | ICD-10-CM

## 2022-04-01 DIAGNOSIS — E7800 Pure hypercholesterolemia, unspecified: Secondary | ICD-10-CM

## 2022-04-01 LAB — HEPATIC FUNCTION PANEL
ALT: 43 IU/L — ABNORMAL HIGH (ref 0–32)
AST: 25 IU/L (ref 0–40)
Albumin: 4.6 g/dL (ref 3.8–4.8)
Alkaline Phosphatase: 79 IU/L (ref 44–121)
Bilirubin Total: 0.2 mg/dL (ref 0.0–1.2)
Bilirubin, Direct: <0.1 mg/dL (ref 0.00–0.40)
Total Protein: 7.8 g/dL (ref 6.0–8.5)

## 2022-04-01 LAB — LIPID PANEL
Chol/HDL Ratio: 5.5 ratio — ABNORMAL HIGH (ref 0.0–4.4)
Cholesterol, Total: 193 mg/dL (ref 100–199)
HDL: 35 mg/dL — ABNORMAL LOW (ref >39)
LDL Chol Calc (NIH): 112 mg/dL — ABNORMAL HIGH (ref 0–99)
Triglycerides: 265 mg/dL — ABNORMAL HIGH (ref 0–149)
VLDL Cholesterol Cal: 46 mg/dL — ABNORMAL HIGH (ref 5–40)

## 2022-04-01 MED ORDER — ATORVASTATIN CALCIUM 10 MG PO TABS: 10.0000 mg | ORAL_TABLET | Freq: Every day | ORAL | 3 refills | Status: DC

## 2022-04-01 NOTE — Patient Instructions (Signed)
Return for a a follow up appointment with Dr. Oval Linsey Monday July 31 at 9:15   Check your blood pressure at home daily (if able) and keep record of the readings.  Take your BP meds as follows:  Continue with your current medications  Bring all of your meds, your BP cuff and your record of home blood pressures to your next appointment.  Exercise as you're able, try to walk approximately 30 minutes per day.  Keep salt intake to a minimum, especially watch canned and prepared boxed foods.  Eat more fresh fruits and vegetables and fewer canned items.  Avoid eating in fast food restaurants.    HOW TO TAKE YOUR BLOOD PRESSURE: Rest 5 minutes before taking your blood pressure.  Don't smoke or drink caffeinated beverages for at least 30 minutes before. Take your blood pressure before (not after) you eat. Sit comfortably with your back supported and both feet on the floor (don't cross your legs). Elevate your arm to heart level on a table or a desk. Use the proper sized cuff. It should fit smoothly and snugly around your bare upper arm. There should be enough room to slip a fingertip under the cuff. The bottom edge of the cuff should be 1 inch above the crease of the elbow. Ideally, take 3 measurements at one sitting and record the average.

## 2022-04-05 ENCOUNTER — Encounter (HOSPITAL_BASED_OUTPATIENT_CLINIC_OR_DEPARTMENT_OTHER): Payer: Self-pay | Admitting: Pharmacist Clinician (PhC)/ Clinical Pharmacy Specialist

## 2022-04-05 NOTE — Assessment & Plan Note (Signed)
Patient with essential hypertension, now doing quite well on combination of 2 medications.  Will have her continue current regimen.  She is working on increasing movement throughout the day and will re-join PREP as soon as she is able.  She is scheduled for follow up with Dr. Oval Linsey next month.

## 2022-04-12 ENCOUNTER — Ambulatory Visit
Admission: RE | Admit: 2022-04-12 | Discharge: 2022-04-12 | Disposition: A | Discharge status: Home / Self Care | Payer: 59 | Source: Ambulatory Visit | Attending: Internal Medicine | Admitting: Internal Medicine

## 2022-04-12 DIAGNOSIS — Z1231 Encounter for screening mammogram for malignant neoplasm of breast: Secondary | ICD-10-CM

## 2022-04-14 ENCOUNTER — Ambulatory Visit: Payer: Self-pay

## 2022-04-14 ENCOUNTER — Telehealth: Payer: 59 | Admitting: Physician Assistant

## 2022-04-14 DIAGNOSIS — U071 COVID-19: Secondary | ICD-10-CM

## 2022-04-14 MED ORDER — MOLNUPIRAVIR EUA 200MG CAPSULE: 4.0000 | ORAL_CAPSULE | Freq: Two times a day (BID) | ORAL | 0 refills | Status: AC

## 2022-04-14 MED ORDER — BENZONATATE 100 MG PO CAPS: 100.0000 mg | ORAL_CAPSULE | Freq: Three times a day (TID) | ORAL | 0 refills | Status: DC | PRN

## 2022-04-14 NOTE — Telephone Encounter (Signed)
    Chief Complaint: COVID positive Symptoms: Cough, fever,aches, congestion Frequency: Tuesday Pertinent Negatives: Patient denies  Disposition: '[]'$ ED /'[]'$ Urgent Care (no appt availability in office) / '[]'$ Appointment(In office/virtual)/ '[x]'$  [facility]/ '[]'$ Home Care/ '[]'$ Refused Recommended Disposition /'[]'$  Mobile Bus/ '[]'$  Follow-up with PCP Additional Notes:   Reason for Disposition  MILD difficulty breathing (e.g., minimal/no SOB at rest, SOB with walking, pulse <100)  Answer Assessment - Initial Assessment Questions 1. COVID-19 DIAGNOSIS: "Who made your COVID-19 diagnosis?" "Was it confirmed by a positive lab test or self-test?" If not diagnosed by a doctor (or NP/PA), ask "Are there lots of cases (community spread) where you live?" Note: See public health department website, if unsure.     hOME Test 2. COVID-19 EXPOSURE: "Was there any known exposure to COVID before the symptoms began?" CDC Definition of close contact: within 6 feet (2 meters) for a total of 15 minutes or more over a 24-hour period.      no 3. ONSET: "When did the COVID-19 symptoms start?"      Tuesday 4. WORST SYMPTOM: "What is your worst symptom?" (e.g., cough, fever, shortness of breath, muscle aches)     Cough, fever 5. COUGH: "Do you have a cough?" If Yes, ask: "How bad is the cough?"       Yes 6. FEVER: "Do you have a fever?" If Yes, ask: "What is your temperature, how was it measured, and when did it start?"     Yes -  7. RESPIRATORY STATUS: "Describe your breathing?" (e.g., shortness of breath, wheezing, unable to speak)      SOB 8. BETTER-SAME-WORSE: "Are you getting better, staying the same or getting worse compared to yesterday?"  If getting worse, ask, "In what way?"     sAME 9. HIGH RISK DISEASE: "Do you have any chronic medical problems?" (e.g., asthma, heart or lung disease, weak immune system, obesity, etc.)     yES 10. VACCINE: "Have you had the COVID-19 vaccine?" If Yes, ask:  "Which one, how many shots, when did you get it?"       N/A 11. BOOSTER: "Have you received your COVID-19 booster?" If Yes, ask: "Which one and when did you get it?"       N/A 12. PREGNANCY: "Is there any chance you are pregnant?" "When was your last menstrual period?"       No 13. OTHER SYMPTOMS: "Do you have any other symptoms?"  (e.g., chills, fatigue, headache, loss of smell or taste, muscle pain, sore throat)       Chest tightness from cough 14. O2 SATURATION MONITOR:  "Do you use an oxygen saturation monitor (pulse oximeter) at home?" If Yes, ask "What is your reading (oxygen level) today?" "What is your usual oxygen saturation reading?" (e.g., 95%)       No  Protocols used: Coronavirus (COVID-19) Diagnosed or Suspected-A-AH

## 2022-04-14 NOTE — Progress Notes (Signed)
Virtual Visit Consent   Jessica Pace, you are scheduled for a virtual visit with a [facility] provider today. Just as with appointments in the office, your consent must be obtained to participate. Your consent will be active for this visit and any virtual visit you may have with one of our providers in the next 365 days. If you have a MyChart account, a copy of this consent can be sent to you electronically.  As this is a virtual visit, video technology does not allow for your provider to perform a traditional examination. This may limit your provider's ability to fully assess your condition. If your provider identifies any concerns that need to be evaluated in person or the need to arrange testing (such as labs, EKG, etc.), we will make arrangements to do so. Although advances in technology are sophisticated, we cannot ensure that it will always work on either your end or our end. If the connection with a video visit is poor, the visit may have to be switched to a telephone visit. With either a video or telephone visit, we are not always able to ensure that we have a secure connection.  By engaging in this virtual visit, you consent to the provision of healthcare and authorize for your insurance to be billed (if applicable) for the services provided during this visit. Depending on your insurance coverage, you may receive a charge related to this service.  I need to obtain your verbal consent now. Are you willing to proceed with your visit today? Jessica Pace has provided verbal consent on 04/14/2022 for a virtual visit (video or telephone). Leeanne Rio, [place]  Date: 04/14/2022 9:35 AM  Virtual Visit via Video Note   I, Leeanne Rio, connected with  Jessica Pace  ([id number], 02-Feb-1961) on 04/14/22 at  9:30 AM EDT by a video-enabled telemedicine application and verified that I am speaking with the correct person using two identifiers.  Location: Patient: Virtual Visit  Location Patient: Home Provider: Virtual Visit Location Provider: Home Office   I discussed the limitations of evaluation and management by telemedicine and the availability of in person appointments. The patient expressed understanding and agreed to proceed.    History of Present Illness: Jessica Pace is a 61 y.o. who identifies as a female who was assigned female at birth, and is being seen today for COVID-19. Notes symptoms starting Tuesday with chest congestion, cough that is dry but persistent, chest wall tenderness. Also noting low-grade fever, body aches, fatigue.  Denies GI symptoms with this illness. Took COVID test yesterday with positive result. Denies any known sick contacts.   HPI: HPI  Problems:  Patient Active Problem List   Diagnosis Date Noted   Aortic atherosclerosis ([place]) 03/31/2022   Mixed incontinence 11/29/2021   History of cancer of uterine body 06/14/2021   Hypothyroidism 06/14/2021   Pulmonary nodule 06/14/2021   Elevated LFTs 09/13/2016   Diverticulitis 09/04/2016   Type 2 diabetes mellitus with morbid obesity ([place]) 11/02/2015   History of hypothyroidism 09/01/2015   Obesity (BMI 30-39.9) 08/10/2015   Diabetes type 2, uncontrolled 08/10/2015   Essential hypertension 08/10/2015   History of vitamin D deficiency 08/10/2015   History of headache 08/10/2015   Uterine cancer ([place]) 04/07/2012   Mixed hyperlipidemia 02/28/2012   Vitamin D deficiency 02/28/2012    Allergies:  Allergies  Allergen Reactions   Canagliflozin     Other reaction(s): yeast   Dulaglutide Other (See Comments)    Patient  reports it caused flare of diverticulitis.   Insulin Degludec Other (See Comments)   Metformin Hcl Diarrhea and Other (See Comments)   Butyl Acetate (N-Butyl Acetate) Other (See Comments)    In paints and nail polish- unknown   Medications:  Current Outpatient Medications:    benzonatate (TESSALON) 100 MG capsule, Take 1 capsule (100 mg total) by mouth 3  (three) times daily as needed for cough., Disp: 30 capsule, Rfl: 0   molnupiravir EUA (LAGEVRIO) 200 mg CAPS capsule, Take 4 capsules (800 mg total) by mouth 2 (two) times daily for 5 days., Disp: 40 capsule, Rfl: 0   albuterol (VENTOLIN HFA) 108 (90 Base) MCG/ACT inhaler, Inhale 2 puffs into the lungs every 6 (six) hours as needed for wheezing or shortness of breath., Disp: 8 g, Rfl: 2   amLODipine (NORVASC) 5 MG tablet, Take 1 tablet (5 mg total) by mouth daily., Disp: 30 tablet, Rfl: 3   Ascorbic Acid (VITAMIN C) 1000 MG tablet, Take 1,000 mg by mouth daily., Disp: , Rfl:    atorvastatin (LIPITOR) 10 MG tablet, Take 1 tablet (10 mg total) by mouth daily., Disp: 30 tablet, Rfl: 3   Blood Glucose Monitoring Suppl (ACCU-CHEK GUIDE ME) w/Device KIT, See admin instructions., Disp: , Rfl:    Cod Liver Oil CAPS, Take 1 capsule by mouth every morning., Disp: , Rfl:    fluticasone-salmeterol (ADVAIR DISKUS) 250-50 MCG/ACT AEPB, Inhale 1 puff into the lungs in the morning and at bedtime., Disp: 60 each, Rfl: 6   GARLIC OIL PO, Take 1 tablet by mouth daily., Disp: , Rfl:    Glucose Blood (BLOOD GLUCOSE TEST STRIPS) STRP, Test twice a day. Pt uses one touch verio meter, Disp: 100 each, Rfl: 5   Insulin Pen Needle (PEN NEEDLES) 32G X 4 MM MISC, 1 each by Other route daily. Use with Basalgar, Disp: 30 each, Rfl: 0   levothyroxine (SYNTHROID) 75 MCG tablet, Take 75 mcg by mouth every morning., Disp: , Rfl:    NOVOLOG MIX 70/30 FLEXPEN (70-30) 100 UNIT/ML FlexPen, 64 units with breakfast, 34 units with lunch and 64 units with dinner subcut, Disp: 15 mL, Rfl: 6   olmesartan-hydrochlorothiazide (BENICAR HCT) 40-25 MG tablet, Take 1 tablet by mouth daily., Disp: 90 tablet, Rfl: 3   ONETOUCH DELICA LANCETS FINE MISC, Test twice a daily. Pt uses a one touch verio meter, Disp: 100 each, Rfl: 5   Semaglutide,0.25 or 0.5MG/DOS, (OZEMPIC, 0.25 OR 0.5 MG/DOSE,) 2 MG/3ML SOPN, Inject 0.25 mg into the skin once a week. For  four weeks. If tolerable after 4 weeks, increase to the 0.5 mg weekly dose., Disp: 3 mL, Rfl: 1   solifenacin (VESICARE) 5 MG tablet, TAKE 1 TABLET (5 MG TOTAL) BY MOUTH DAILY., Disp: 90 tablet, Rfl: 0  Observations/Objective: Patient is well-developed, well-nourished in no acute distress.  Resting comfortably at home.  Head is normocephalic, atraumatic.  No labored breathing. Speech is clear and coherent with logical content.  Patient is alert and oriented at baseline.   Assessment and Plan: 1. COVID-19 - MyChart COVID-19 home monitoring program; Future - benzonatate (TESSALON) 100 MG capsule; Take 1 capsule (100 mg total) by mouth 3 (three) times daily as needed for cough.  Dispense: 30 capsule; Refill: 0 - molnupiravir EUA (LAGEVRIO) 200 mg CAPS capsule; Take 4 capsules (800 mg total) by mouth 2 (two) times daily for 5 days.  Dispense: 40 capsule; Refill: 0  Patient with multiple risk factors for complicated course of illness.  Discussed risks/benefits of antiviral medications including most common potential ADRs. Patient voiced understanding and would like to proceed with antiviral medication. They are candidate for molnupiravir as . Rx sent to pharmacy. Supportive measures, OTC medications and vitamin regimen reviewed. Tessalon per orders. Continue Advair and albuterol. Patient has been enrolled in a MyChart COVID symptom monitoring program. Samule Dry reviewed in detail. Strict ER precautions discussed with patient.    Follow Up Instructions: I discussed the assessment and treatment plan with the patient. The patient was provided an opportunity to ask questions and all were answered. The patient agreed with the plan and demonstrated an understanding of the instructions.  A copy of instructions were sent to the patient via MyChart unless otherwise noted below.   The patient was advised to call back or seek an in-person evaluation if the symptoms worsen or if the condition fails to improve  as anticipated.  Time:  I spent 10 minutes with the patient via telehealth technology discussing the above problems/concerns.    Leeanne Rio, PA-C

## 2022-04-14 NOTE — Patient Instructions (Signed)
Jessica Pace, thank you for joining Leeanne Rio, PA-C for today's virtual visit.  While this provider is not your primary care provider (PCP), if your PCP is located in our provider database this encounter information will be shared with them immediately following your visit.  Consent: (Patient) Jessica Pace provided verbal consent for this virtual visit at the beginning of the encounter.  Current Medications:  Current Outpatient Medications:    albuterol (VENTOLIN HFA) 108 (90 Base) MCG/ACT inhaler, Inhale 2 puffs into the lungs every 6 (six) hours as needed for wheezing or shortness of breath., Disp: 8 g, Rfl: 2   amLODipine (NORVASC) 5 MG tablet, Take 1 tablet (5 mg total) by mouth daily., Disp: 30 tablet, Rfl: 3   Ascorbic Acid (VITAMIN C) 1000 MG tablet, Take 1,000 mg by mouth daily., Disp: , Rfl:    atorvastatin (LIPITOR) 10 MG tablet, Take 1 tablet (10 mg total) by mouth daily., Disp: 30 tablet, Rfl: 3   Blood Glucose Monitoring Suppl (ACCU-CHEK GUIDE ME) w/Device KIT, See admin instructions., Disp: , Rfl:    Cod Liver Oil CAPS, Take 1 capsule by mouth every morning., Disp: , Rfl:    fluticasone-salmeterol (ADVAIR DISKUS) 250-50 MCG/ACT AEPB, Inhale 1 puff into the lungs in the morning and at bedtime., Disp: 60 each, Rfl: 6   GARLIC OIL PO, Take 1 tablet by mouth daily., Disp: , Rfl:    Glucose Blood (BLOOD GLUCOSE TEST STRIPS) STRP, Test twice a day. Pt uses one touch verio meter, Disp: 100 each, Rfl: 5   Insulin Pen Needle (PEN NEEDLES) 32G X 4 MM MISC, 1 each by Other route daily. Use with Basalgar, Disp: 30 each, Rfl: 0   levothyroxine (SYNTHROID) 75 MCG tablet, Take 75 mcg by mouth every morning., Disp: , Rfl:    NOVOLOG MIX 70/30 FLEXPEN (70-30) 100 UNIT/ML FlexPen, 64 units with breakfast, 34 units with lunch and 64 units with dinner subcut, Disp: 15 mL, Rfl: 6   olmesartan-hydrochlorothiazide (BENICAR HCT) 40-25 MG tablet, Take 1 tablet by mouth daily., Disp: 90  tablet, Rfl: 3   ONETOUCH DELICA LANCETS FINE MISC, Test twice a daily. Pt uses a one touch verio meter, Disp: 100 each, Rfl: 5   Semaglutide,0.25 or 0.5MG/DOS, (OZEMPIC, 0.25 OR 0.5 MG/DOSE,) 2 MG/3ML SOPN, Inject 0.25 mg into the skin once a week. For four weeks. If tolerable after 4 weeks, increase to the 0.5 mg weekly dose., Disp: 3 mL, Rfl: 1   solifenacin (VESICARE) 5 MG tablet, TAKE 1 TABLET (5 MG TOTAL) BY MOUTH DAILY., Disp: 90 tablet, Rfl: 0   Medications ordered in this encounter:  No orders of the defined types were placed in this encounter.    *If you need refills on other medications prior to your next appointment, please contact your pharmacy*  Follow-Up: Call back or seek an in-person evaluation if the symptoms worsen or if the condition fails to improve as anticipated.  Other Instructions Please keep well-hydrated and get plenty of rest. Start a saline nasal rinse to flush out your nasal passages. You can use plain Mucinex to help thin congestion. If you have a humidifier, running in the bedroom at night. I want you to start OTC vitamin D3 1000 units daily, vitamin C 1000 mg daily, and a zinc supplement. Please take prescribed medications as directed.  You have been enrolled in a MyChart symptom monitoring program. Please answer these questions daily so we can keep track of how you are doing.  You were to quarantine for 5 days from onset of your symptoms.  After day 5, if you have had no fever and you are feeling better, you can end quarantine but need to mask for an additional 5 days. After day 5 if you have a fever or are having significant symptoms, please quarantine for full 10 days.  If you note any worsening of symptoms, any significant shortness of breath or any chest pain, please seek ER evaluation ASAP.  Please do not delay care!  COVID-19: What to Do if You Are Sick If you test positive and are an older adult or someone who is at high risk of getting very  sick from COVID-19, treatment may be available. Contact a healthcare provider right away after a positive test to determine if you are eligible, even if your symptoms are mild right now. You can also visit a Test to Treat location and, if eligible, receive a prescription from a provider. Don't delay: Treatment must be started within the first few days to be effective. If you have a fever, cough, or other symptoms, you might have COVID-19. Most people have mild illness and are able to recover at home. If you are sick: Keep track of your symptoms. If you have an emergency warning sign (including trouble breathing), call 911. Steps to help prevent the spread of COVID-19 if you are sick If you are sick with COVID-19 or think you might have COVID-19, follow the steps below to care for yourself and to help protect other people in your home and community. Stay home except to get medical care Stay home. Most people with COVID-19 have mild illness and can recover at home without medical care. Do not leave your home, except to get medical care. Do not visit public areas and do not go to places where you are unable to wear a mask. Take care of yourself. Get rest and stay hydrated. Take over-the-counter medicines, such as acetaminophen, to help you feel better. Stay in touch with your doctor. Call before you get medical care. Be sure to get care if you have trouble breathing, or have any other emergency warning signs, or if you think it is an emergency. Avoid public transportation, ride-sharing, or taxis if possible. Get tested If you have symptoms of COVID-19, get tested. While waiting for test results, stay away from others, including staying apart from those living in your household. Get tested as soon as possible after your symptoms start. Treatments may be available for people with COVID-19 who are at risk for becoming very sick. Don't delay: Treatment must be started early to be effective--some treatments  must begin within 5 days of your first symptoms. Contact your healthcare provider right away if your test result is positive to determine if you are eligible. Self-tests are one of several options for testing for the virus that causes COVID-19 and may be more convenient than laboratory-based tests and point-of-care tests. Ask your healthcare provider or your local health department if you need help interpreting your test results. You can visit your state, tribal, local, and territorial health department's website to look for the latest local information on testing sites. Separate yourself from other people As much as possible, stay in a specific room and away from other people and pets in your home. If possible, you should use a separate bathroom. If you need to be around other people or animals in or outside of the home, wear a well-fitting mask. Tell your close contacts that  they may have been exposed to COVID-19. An infected person can spread COVID-19 starting 48 hours (or 2 days) before the person has any symptoms or tests positive. By letting your close contacts know they may have been exposed to COVID-19, you are helping to protect everyone. See COVID-19 and Animals if you have questions about pets. If you are diagnosed with COVID-19, someone from the health department may call you. Answer the call to slow the spread. Monitor your symptoms Symptoms of COVID-19 include fever, cough, or other symptoms. Follow care instructions from your healthcare provider and local health department. Your local health authorities may give instructions on checking your symptoms and reporting information. When to seek emergency medical attention Look for emergency warning signs* for COVID-19. If someone is showing any of these signs, seek emergency medical care immediately: Trouble breathing Persistent pain or pressure in the chest New confusion Inability to wake or stay awake Pale, gray, or blue-colored skin,  lips, or nail beds, depending on skin tone *This list is not all possible symptoms. Please call your medical provider for any other symptoms that are severe or concerning to you. Call 911 or call ahead to your local emergency facility: Notify the operator that you are seeking care for someone who has or may have COVID-19. Call ahead before visiting your doctor Call ahead. Many medical visits for routine care are being postponed or done by phone or telemedicine. If you have a medical appointment that cannot be postponed, call your doctor's office, and tell them you have or may have COVID-19. This will help the office protect themselves and other patients. If you are sick, wear a well-fitting mask You should wear a mask if you must be around other people or animals, including pets (even at home). Wear a mask with the best fit, protection, and comfort for you. You don't need to wear the mask if you are alone. If you can't put on a mask (because of trouble breathing, for example), cover your coughs and sneezes in some other way. Try to stay at least 6 feet away from other people. This will help protect the people around you. Masks should not be placed on young children under age 25 years, anyone who has trouble breathing, or anyone who is not able to remove the mask without help. Cover your coughs and sneezes Cover your mouth and nose with a tissue when you cough or sneeze. Throw away used tissues in a lined trash can. Immediately wash your hands with soap and water for at least 20 seconds. If soap and water are not available, clean your hands with an alcohol-based hand sanitizer that contains at least 60% alcohol. Clean your hands often Wash your hands often with soap and water for at least 20 seconds. This is especially important after blowing your nose, coughing, or sneezing; going to the bathroom; and before eating or preparing food. Use hand sanitizer if soap and water are not available. Use an  alcohol-based hand sanitizer with at least 60% alcohol, covering all surfaces of your hands and rubbing them together until they feel dry. Soap and water are the best option, especially if hands are visibly dirty. Avoid touching your eyes, nose, and mouth with unwashed hands. Handwashing Tips Avoid sharing personal household items Do not share dishes, drinking glasses, cups, eating utensils, towels, or bedding with other people in your home. Wash these items thoroughly after using them with soap and water or put in the dishwasher. Clean surfaces in your home  regularly Clean and disinfect high-touch surfaces (for example, doorknobs, tables, handles, light switches, and countertops) in your "sick room" and bathroom. In shared spaces, you should clean and disinfect surfaces and items after each use by the person who is ill. If you are sick and cannot clean, a caregiver or other person should only clean and disinfect the area around you (such as your bedroom and bathroom) on an as needed basis. Your caregiver/other person should wait as long as possible (at least several hours) and wear a mask before entering, cleaning, and disinfecting shared spaces that you use. Clean and disinfect areas that may have blood, stool, or body fluids on them. Use household cleaners and disinfectants. Clean visible dirty surfaces with household cleaners containing soap or detergent. Then, use a household disinfectant. Use a product from [employer] List N: Disinfectants for Coronavirus (QZESP-23). Be sure to follow the instructions on the label to ensure safe and effective use of the product. Many products recommend keeping the surface wet with a disinfectant for a certain period of time (look at "contact time" on the product label). You may also need to wear personal protective equipment, such as gloves, depending on the directions on the product label. Immediately after disinfecting, wash your hands with soap and water for 20  seconds. For completed guidance on cleaning and disinfecting your home, visit Complete Disinfection Guidance. Take steps to improve ventilation at home Improve ventilation (air flow) at home to help prevent from spreading COVID-19 to other people in your household. Clear out COVID-19 virus particles in the air by opening windows, using air filters, and turning on fans in your home. Use this interactive tool to learn how to improve air flow in your home. When you can be around others after being sick with COVID-19 Deciding when you can be around others is different for different situations. Find out when you can safely end home isolation. For any additional questions about your care, contact your healthcare provider or state or local health department. 01/05/2021 Content source: [facility] ([facility]), Division of Viral Diseases This information is not intended to replace advice given to you by your health care provider. Make sure you discuss any questions you have with your health care provider. Document Revised: 02/18/2021 Document Reviewed: 02/18/2021 Elsevier Patient Education  2022 [employer].      If you have been instructed to have an in-person evaluation today at a local Urgent Care facility, please use the link below. It will take you to a list of all of our available [facility], including address, phone number and hours of operation. Please do not delay care.  [facility]  If you or a family member do not have a primary care provider, use the link below to schedule a visit and establish care. When you choose a [facility] primary care physician or advanced practice provider, you gain a long-term partner in health. Find a Primary Care Provider  Learn more about [facility] in-office and virtual care options: [facility]

## 2022-04-20 ENCOUNTER — Telehealth: Payer: Self-pay

## 2022-04-20 NOTE — Telephone Encounter (Signed)
Called to discuss PREP schedule for July/August; she is recovering from Covid,  gave her the schedule, she will talk with her supervisor and let me know.

## 2022-04-22 ENCOUNTER — Ambulatory Visit: Payer: Self-pay

## 2022-04-22 ENCOUNTER — Telehealth: Payer: Self-pay

## 2022-04-22 ENCOUNTER — Telehealth: Payer: 59 | Admitting: Emergency Medicine

## 2022-04-22 DIAGNOSIS — U071 COVID-19: Secondary | ICD-10-CM | POA: Diagnosis not present

## 2022-04-22 DIAGNOSIS — R5383 Other fatigue: Secondary | ICD-10-CM | POA: Diagnosis not present

## 2022-04-22 NOTE — Telephone Encounter (Signed)
Called and spoke with patient in regards to the [place] that indicated she was having worsening weakness. Advise patient to contact 911 or seek help in the ED. Pt verbalized understanding.

## 2022-04-22 NOTE — Progress Notes (Signed)
Virtual Visit Consent   FORESTINE MACHO, you are scheduled for a virtual visit with a [facility] provider today. Just as with appointments in the office, your consent must be obtained to participate. Your consent will be active for this visit and any virtual visit you may have with one of our providers in the next 365 days. If you have a MyChart account, a copy of this consent can be sent to you electronically.  As this is a virtual visit, video technology does not allow for your provider to perform a traditional examination. This may limit your provider's ability to fully assess your condition. If your provider identifies any concerns that need to be evaluated in person or the need to arrange testing (such as labs, EKG, etc.), we will make arrangements to do so. Although advances in technology are sophisticated, we cannot ensure that it will always work on either your end or our end. If the connection with a video visit is poor, the visit may have to be switched to a telephone visit. With either a video or telephone visit, we are not always able to ensure that we have a secure connection.  By engaging in this virtual visit, you consent to the provision of healthcare and authorize for your insurance to be billed (if applicable) for the services provided during this visit. Depending on your insurance coverage, you may receive a charge related to this service.  I need to obtain your verbal consent now. Are you willing to proceed with your visit today? Jessica Pace has provided verbal consent on 04/22/2022 for a virtual visit (video or telephone). Carvel Getting, NP  Date: 04/22/2022 11:26 AM  Virtual Visit via Video Note   I, Carvel Getting, connected with  Jessica Pace  ([id number], 08/30/1961) on 04/22/22 at 11:00 AM EDT by a video-enabled telemedicine application and verified that I am speaking with the correct person using two identifiers.  Location: Patient: Virtual Visit Location Patient:  Home Provider: Virtual Visit Location Provider: Home Office   I discussed the limitations of evaluation and management by telemedicine and the availability of in person appointments. The patient expressed understanding and agreed to proceed.    History of Present Illness: Jessica Pace is a 61 y.o. who identifies as a female who was assigned female at birth, and is being seen today for fatigue.  She reports she developed symptoms of COVID 04/13/2022 and tested + 04/14/2022.  She had been feeling better and return to work in a virtual manner 04/20/2022.  Yesterday, 7/6, she returned to work in person and her fatigue increased after that.  She reports her shortness of breath is not different than its been and she does not feel like her breathing is a problem.  She is more concerned over her severe fatigue.  She had been feeling better and not super fatigued until working in person yesterday.  Still does not have a sense of taste so she is less interested in eating, but she does feel like she is consuming sufficient calories.  She reports she is staying hydrated and her urine is a light yellow color.  HPI: HPI  Problems:  Patient Active Problem List   Diagnosis Date Noted   Aortic atherosclerosis ([place]) 03/31/2022   Mixed incontinence 11/29/2021   History of cancer of uterine body 06/14/2021   Hypothyroidism 06/14/2021   Pulmonary nodule 06/14/2021   Elevated LFTs 09/13/2016   Diverticulitis 09/04/2016   Type 2 diabetes mellitus with  morbid obesity ([place]) 11/02/2015   History of hypothyroidism 09/01/2015   Obesity (BMI 30-39.9) 08/10/2015   Diabetes type 2, uncontrolled 08/10/2015   Essential hypertension 08/10/2015   History of vitamin D deficiency 08/10/2015   History of headache 08/10/2015   Uterine cancer ([place]) 04/07/2012   Mixed hyperlipidemia 02/28/2012   Vitamin D deficiency 02/28/2012    Allergies:  Allergies  Allergen Reactions   Canagliflozin     Other reaction(s): yeast    Dulaglutide Other (See Comments)    Patient reports it caused flare of diverticulitis.   Insulin Degludec Other (See Comments)   Metformin Hcl Diarrhea and Other (See Comments)   Butyl Acetate (N-Butyl Acetate) Other (See Comments)    In paints and nail polish- unknown   Medications:  Current Outpatient Medications:    albuterol (VENTOLIN HFA) 108 (90 Base) MCG/ACT inhaler, Inhale 2 puffs into the lungs every 6 (six) hours as needed for wheezing or shortness of breath., Disp: 8 g, Rfl: 2   amLODipine (NORVASC) 5 MG tablet, Take 1 tablet (5 mg total) by mouth daily., Disp: 30 tablet, Rfl: 3   Ascorbic Acid (VITAMIN C) 1000 MG tablet, Take 1,000 mg by mouth daily., Disp: , Rfl:    atorvastatin (LIPITOR) 10 MG tablet, Take 1 tablet (10 mg total) by mouth daily., Disp: 30 tablet, Rfl: 3   benzonatate (TESSALON) 100 MG capsule, Take 1 capsule (100 mg total) by mouth 3 (three) times daily as needed for cough., Disp: 30 capsule, Rfl: 0   Blood Glucose Monitoring Suppl (ACCU-CHEK GUIDE ME) w/Device KIT, See admin instructions., Disp: , Rfl:    Cod Liver Oil CAPS, Take 1 capsule by mouth every morning., Disp: , Rfl:    fluticasone-salmeterol (ADVAIR DISKUS) 250-50 MCG/ACT AEPB, Inhale 1 puff into the lungs in the morning and at bedtime., Disp: 60 each, Rfl: 6   GARLIC OIL PO, Take 1 tablet by mouth daily., Disp: , Rfl:    Glucose Blood (BLOOD GLUCOSE TEST STRIPS) STRP, Test twice a day. Pt uses one touch verio meter, Disp: 100 each, Rfl: 5   Insulin Pen Needle (PEN NEEDLES) 32G X 4 MM MISC, 1 each by Other route daily. Use with Basalgar, Disp: 30 each, Rfl: 0   levothyroxine (SYNTHROID) 75 MCG tablet, Take 75 mcg by mouth every morning., Disp: , Rfl:    NOVOLOG MIX 70/30 FLEXPEN (70-30) 100 UNIT/ML FlexPen, 64 units with breakfast, 34 units with lunch and 64 units with dinner subcut, Disp: 15 mL, Rfl: 6   olmesartan-hydrochlorothiazide (BENICAR HCT) 40-25 MG tablet, Take 1 tablet by mouth daily., Disp:  90 tablet, Rfl: 3   ONETOUCH DELICA LANCETS FINE MISC, Test twice a daily. Pt uses a one touch verio meter, Disp: 100 each, Rfl: 5   Semaglutide,0.25 or 0.5MG/DOS, (OZEMPIC, 0.25 OR 0.5 MG/DOSE,) 2 MG/3ML SOPN, Inject 0.25 mg into the skin once a week. For four weeks. If tolerable after 4 weeks, increase to the 0.5 mg weekly dose., Disp: 3 mL, Rfl: 1   solifenacin (VESICARE) 5 MG tablet, TAKE 1 TABLET (5 MG TOTAL) BY MOUTH DAILY., Disp: 90 tablet, Rfl: 0  Observations/Objective: Patient is well-developed, well-nourished in no acute distress.  Resting comfortably  at home.  Head is normocephalic, atraumatic.  No labored breathing.  Speech is clear and coherent with logical content.  Patient is alert and oriented at baseline.    Assessment and Plan: 1. Other fatigue  2. COVID  We discussed normal recovery from COVID and that  some people do have significant fatigue for prolonged time.  I encouraged her to rest for the next few days and gave her a note to return to work 04/25/2022.  We discussed reasons for seeking a higher level of care such as in the ER.  Follow Up Instructions: I discussed the assessment and treatment plan with the patient. The patient was provided an opportunity to ask questions and all were answered. The patient agreed with the plan and demonstrated an understanding of the instructions.  A copy of instructions were sent to the patient via MyChart unless otherwise noted below.   The patient was advised to call back or seek an in-person evaluation if the symptoms worsen or if the condition fails to improve as anticipated.  Time:  I spent 13 minutes with the patient via telehealth technology discussing the above problems/concerns.    Carvel Getting, NP

## 2022-04-22 NOTE — Patient Instructions (Signed)
Jessica Pace, thank you for joining Carvel Getting, NP for today's virtual visit.  While this provider is not your primary care provider (PCP), if your PCP is located in our provider database this encounter information will be shared with them immediately following your visit.  Consent: (Patient) Jessica Pace provided verbal consent for this virtual visit at the beginning of the encounter.  Current Medications:  Current Outpatient Medications:    albuterol (VENTOLIN HFA) 108 (90 Base) MCG/ACT inhaler, Inhale 2 puffs into the lungs every 6 (six) hours as needed for wheezing or shortness of breath., Disp: 8 g, Rfl: 2   amLODipine (NORVASC) 5 MG tablet, Take 1 tablet (5 mg total) by mouth daily., Disp: 30 tablet, Rfl: 3   Ascorbic Acid (VITAMIN C) 1000 MG tablet, Take 1,000 mg by mouth daily., Disp: , Rfl:    atorvastatin (LIPITOR) 10 MG tablet, Take 1 tablet (10 mg total) by mouth daily., Disp: 30 tablet, Rfl: 3   benzonatate (TESSALON) 100 MG capsule, Take 1 capsule (100 mg total) by mouth 3 (three) times daily as needed for cough., Disp: 30 capsule, Rfl: 0   Blood Glucose Monitoring Suppl (ACCU-CHEK GUIDE ME) w/Device KIT, See admin instructions., Disp: , Rfl:    Cod Liver Oil CAPS, Take 1 capsule by mouth every morning., Disp: , Rfl:    fluticasone-salmeterol (ADVAIR DISKUS) 250-50 MCG/ACT AEPB, Inhale 1 puff into the lungs in the morning and at bedtime., Disp: 60 each, Rfl: 6   GARLIC OIL PO, Take 1 tablet by mouth daily., Disp: , Rfl:    Glucose Blood (BLOOD GLUCOSE TEST STRIPS) STRP, Test twice a day. Pt uses one touch verio meter, Disp: 100 each, Rfl: 5   Insulin Pen Needle (PEN NEEDLES) 32G X 4 MM MISC, 1 each by Other route daily. Use with Basalgar, Disp: 30 each, Rfl: 0   levothyroxine (SYNTHROID) 75 MCG tablet, Take 75 mcg by mouth every morning., Disp: , Rfl:    NOVOLOG MIX 70/30 FLEXPEN (70-30) 100 UNIT/ML FlexPen, 64 units with breakfast, 34 units with lunch and 64 units  with dinner subcut, Disp: 15 mL, Rfl: 6   olmesartan-hydrochlorothiazide (BENICAR HCT) 40-25 MG tablet, Take 1 tablet by mouth daily., Disp: 90 tablet, Rfl: 3   ONETOUCH DELICA LANCETS FINE MISC, Test twice a daily. Pt uses a one touch verio meter, Disp: 100 each, Rfl: 5   Semaglutide,0.25 or 0.5MG/DOS, (OZEMPIC, 0.25 OR 0.5 MG/DOSE,) 2 MG/3ML SOPN, Inject 0.25 mg into the skin once a week. For four weeks. If tolerable after 4 weeks, increase to the 0.5 mg weekly dose., Disp: 3 mL, Rfl: 1   solifenacin (VESICARE) 5 MG tablet, TAKE 1 TABLET (5 MG TOTAL) BY MOUTH DAILY., Disp: 90 tablet, Rfl: 0   Medications ordered in this encounter:  No orders of the defined types were placed in this encounter.    *If you need refills on other medications prior to your next appointment, please contact your pharmacy*  Follow-Up: Call back or seek an in-person evaluation if the symptoms worsen or if the condition fails to improve as anticipated.  Other Instructions Be sure to drink plenty of fluids and stay hydrated.  It is okay if you do not feel like eating but make sure you are getting at least a few calories to give you energy.  Fatigue with COVID is very common.  If you are very tired, your body is telling you you need to rest and it is okay to  rest.  Go ahead and take some deep breaths several times throughout the day while you are resting.  If you feel like your shortness of breath is getting progressively worse, please get seek care in an emergency room.  Looking at your records, it does seem that your due for COVID booster.  You might consider waiting 2 months after your recovery from this COVID illness as it does provide you with a little bit of immunity in the short-term.  If you have been instructed to have an in-person evaluation today at a local Urgent Care facility, please use the link below. It will take you to a list of all of our available [facility], including address, phone  number and hours of operation. Please do not delay care.  [facility]  If you or a family member do not have a primary care provider, use the link below to schedule a visit and establish care. When you choose a [facility] primary care physician or advanced practice provider, you gain a long-term partner in health. Find a Primary Care Provider  Learn more about [facility]'s in-office and virtual care options: [facility]

## 2022-04-22 NOTE — Telephone Encounter (Signed)
Pt is calling for advice - pt tested positive for COVID on 04/13/22. Pt was treated. And return back to work with a negative COVID yesterday. However, pt is still very fatigued  & Coughing today. Pt would like to know is this normal/     Chief Complaint: Diagnosed with COVID 04/13/22. Had a VV. Still has cough and fatigued. Symptoms: Above Frequency: Last week Pertinent Negatives: Patient denies fever Disposition: '[]'$ ED /'[]'$ Urgent Care (no appt availability in office) / '[]'$ Appointment(In office/virtual)/ '[x]'$  [facility]/ '[]'$ Home Care/ '[]'$ Refused Recommended Disposition /'[]'$ [facility]/ '[]'$  Follow-up with PCP Additional Notes:   Reason for Disposition  [1] MILD difficulty breathing (e.g., minimal/no SOB at rest, SOB with walking, pulse <100) AND [2] new-onset  Answer Assessment - Initial Assessment Questions 1. COVID-19 ONSET: "When did the symptoms of COVID-19 first start?"     04/13/22 2. DIAGNOSIS CONFIRMATION: "How were you diagnosed?" (e.g., COVID-19 oral or nasal viral test; COVID-19 antibody test; doctor visit)     Home 3. MAIN SYMPTOM:  "What is your main concern or symptom right now?" (e.g., breathing difficulty, cough, fatigue. loss of smell)     Fatigue 4. SYMPTOM ONSET: "When did the  symptoms  start?"     LAST week 5. BETTER-SAME-WORSE: "Are you getting better, staying the same, or getting worse over the last 1 to 2 weeks?"     Better 6. RECENT MEDICAL VISIT: "Have you been seen by a healthcare provider (doctor, NP, PA) for these persisting COVID-19 symptoms?" If Yes, ask: "When were you seen?" (e.g., date)     Yes 7. COUGH: "Do you have a cough?" If Yes, ask: "How bad is the cough?"       Yes 8. FEVER: "Do you have a fever?" If Yes, ask: "What is your temperature, how was it measured, and when did it start?"     No 9. BREATHING DIFFICULTY: "Are you having any trouble breathing?" If Yes, ask: "How bad is your breathing?" (e.g., mild, moderate, severe)    -  MILD: No SOB at rest, mild SOB with walking, speaks normally in sentences, can lie down, no retractions, pulse < 100.    - MODERATE: SOB at rest, SOB with minimal exertion and prefers to sit, cannot lie down flat, speaks in phrases, mild retractions, audible wheezing, pulse 100-120.    - SEVERE: Very SOB at rest, speaks in single words, struggling to breathe, sitting hunched forward, retractions, pulse > 120       No 10. HIGH RISK DISEASE: "Do you have any chronic medical problems?" (e.g., asthma, heart or lung disease, weak immune system, obesity, etc.)       Yes 11. VACCINE: "Have you gotten the COVID-19 vaccine?" If Yes, ask: "Which one, how many shots, when did you get it?"       N/a 12. BOOSTER: "Have you received your COVID-19 booster?" If Yes, ask: "Which one and when did you get it?"       N/a 13. PREGNANCY: "Is there any chance you are pregnant?" "When was your last menstrual period?"       No 14. OTHER SYMPTOMS: "Do you have any other symptoms?"  (e.g., fatigue, headache, muscle pain, weakness)       Cough 15. O2 SATURATION MONITOR:  "Do you use an oxygen saturation monitor (pulse oximeter) at home?" If Yes, ask "What is your reading (oxygen level) today?" "What is your usual oxygen saturation reading?" (e.g., 95%)       No  Protocols used: Coronavirus (COVID-19) Persisting Symptoms Follow-up Call-A-[facility]

## 2022-04-23 ENCOUNTER — Other Ambulatory Visit (HOSPITAL_BASED_OUTPATIENT_CLINIC_OR_DEPARTMENT_OTHER): Payer: Self-pay | Admitting: Cardiovascular Disease

## 2022-04-26 ENCOUNTER — Other Ambulatory Visit: Payer: Self-pay

## 2022-04-26 ENCOUNTER — Emergency Department (HOSPITAL_BASED_OUTPATIENT_CLINIC_OR_DEPARTMENT_OTHER)
Admission: EM | Admit: 2022-04-26 | Discharge: 2022-04-26 | Disposition: A | Discharge status: Home / Self Care | Payer: 59 | Source: Home / Self Care | Attending: Emergency Medicine | Admitting: Emergency Medicine

## 2022-04-26 ENCOUNTER — Encounter (HOSPITAL_BASED_OUTPATIENT_CLINIC_OR_DEPARTMENT_OTHER): Payer: Self-pay | Admitting: Emergency Medicine

## 2022-04-26 ENCOUNTER — Encounter (HOSPITAL_COMMUNITY): Payer: Self-pay

## 2022-04-26 ENCOUNTER — Emergency Department (HOSPITAL_COMMUNITY)
Admission: EM | Admit: 2022-04-26 | Discharge: 2022-04-26 | Discharge status: Against Medical Advice | Payer: 59 | Attending: Emergency Medicine | Admitting: Emergency Medicine

## 2022-04-26 ENCOUNTER — Emergency Department (HOSPITAL_BASED_OUTPATIENT_CLINIC_OR_DEPARTMENT_OTHER): Payer: 59

## 2022-04-26 ENCOUNTER — Ambulatory Visit: Payer: Self-pay | Admitting: *Deleted

## 2022-04-26 DIAGNOSIS — R531 Weakness: Secondary | ICD-10-CM | POA: Insufficient documentation

## 2022-04-26 DIAGNOSIS — Z794 Long term (current) use of insulin: Secondary | ICD-10-CM | POA: Insufficient documentation

## 2022-04-26 DIAGNOSIS — R42 Dizziness and giddiness: Secondary | ICD-10-CM | POA: Diagnosis not present

## 2022-04-26 DIAGNOSIS — E860 Dehydration: Secondary | ICD-10-CM | POA: Insufficient documentation

## 2022-04-26 DIAGNOSIS — Z5321 Procedure and treatment not carried out due to patient leaving prior to being seen by health care provider: Secondary | ICD-10-CM | POA: Diagnosis not present

## 2022-04-26 DIAGNOSIS — U071 COVID-19: Secondary | ICD-10-CM | POA: Insufficient documentation

## 2022-04-26 DIAGNOSIS — E119 Type 2 diabetes mellitus without complications: Secondary | ICD-10-CM | POA: Insufficient documentation

## 2022-04-26 LAB — CBG MONITORING, ED: Glucose-Capillary: 72 mg/dL (ref 70–99)

## 2022-04-26 LAB — BASIC METABOLIC PANEL
Anion gap: 9 (ref 5–15)
BUN: 17 mg/dL (ref 8–23)
CO2: 30 mmol/L (ref 22–32)
Calcium: 10.3 mg/dL (ref 8.9–10.3)
Chloride: 99 mmol/L (ref 98–111)
Creatinine, Ser: 0.93 mg/dL (ref 0.44–1.00)
GFR, Estimated: >60 mL/min (ref >60)
Glucose, Bld: 86 mg/dL (ref 70–99)
Potassium: 4.5 mmol/L (ref 3.5–5.1)
Sodium: 138 mmol/L (ref 135–145)

## 2022-04-26 LAB — CBC WITH DIFFERENTIAL/PLATELET
Abs Immature Granulocytes: 0.02 10*3/uL (ref 0.00–0.07)
Basophils Absolute: 0.1 10*3/uL (ref 0.0–0.1)
Basophils Relative: 1 %
Eosinophils Absolute: 0.1 10*3/uL (ref 0.0–0.5)
Eosinophils Relative: 1 %
HCT: 37.9 % (ref 36.0–46.0)
Hemoglobin: 12.4 g/dL (ref 12.0–15.0)
Immature Granulocytes: 0 %
Lymphocytes Relative: 32 %
Lymphs Abs: 2.3 10*3/uL (ref 0.7–4.0)
MCH: 28.5 pg (ref 26.0–34.0)
MCHC: 32.7 g/dL (ref 30.0–36.0)
MCV: 87.1 fL (ref 80.0–100.0)
Monocytes Absolute: 0.6 10*3/uL (ref 0.1–1.0)
Monocytes Relative: 8 %
Neutro Abs: 4.1 10*3/uL (ref 1.7–7.7)
Neutrophils Relative %: 58 %
Platelets: 276 10*3/uL (ref 150–400)
RBC: 4.35 MIL/uL (ref 3.87–5.11)
RDW: 14.6 % (ref 11.5–15.5)
WBC: 7.1 10*3/uL (ref 4.0–10.5)
nRBC: 0 % (ref 0.0–0.2)

## 2022-04-26 LAB — MAGNESIUM: Magnesium: 2.2 mg/dL (ref 1.7–2.4)

## 2022-04-26 LAB — SARS CORONAVIRUS 2 BY RT PCR: SARS Coronavirus 2 by RT PCR: POSITIVE — AB

## 2022-04-26 MED ORDER — LACTATED RINGERS IV BOLUS: 1000.0000 mL | Freq: Once | INTRAVENOUS | Status: DC

## 2022-04-26 MED ORDER — LACTATED RINGERS IV BOLUS
500.0000 mL | Freq: Once | INTRAVENOUS | Status: AC
  Administered 2022-04-26: 500 mL via INTRAVENOUS

## 2022-04-26 NOTE — ED Provider Triage Note (Signed)
Emergency Medicine Provider Triage Evaluation Note  Jessica Pace , a 61 y.o. female with history of type 2 diabetes, hyperlipidemia was evaluated in triage.  Pt complains of generalized weakness and dizziness that described as lightheadedness that began about 4 days ago.  Patient was diagnosed with COVID on 04/13/2022 and has mostly improved with her symptoms aside from some residual congestion.  She thought that she was feeling better until the weakness and dizziness started.  She feels weak when she is walking and that she may pass out.  No syncope.  She denies fever, chills, chest pain, shortness of breath, nausea, vomiting and diarrhea.  She also has some constipation.  Review of Systems  Positive: As above Negative: As above  Physical Exam  BP 123/75 (BP Location: Right Arm)   Pulse 85   Temp 98.2 F (36.8 C) (Oral)   Resp 20   Ht '5\' 4"'$  (1.626 m)   Wt 109.8 kg   SpO2 96%   BMI 41.54 kg/m  Gen:   Awake, no distress   Resp:  Normal effort  MSK:   Moves extremities without difficulty  Other:  Strong and equal grip strength bilaterally.  Strong straight leg raise against resistance off the table bilaterally.  Subjective sensation intact.  Eyes PERRL.  EOM intact.  Medical Decision Making  Medically screening exam initiated at 12:29 PM.  Appropriate orders placed.  Jessica Pace was informed that the remainder of the evaluation will be completed by another provider, this initial triage assessment does not replace that evaluation, and the importance of remaining in the ED until their evaluation is complete.     Jessica Pace, [place] 04/26/22 1232

## 2022-04-26 NOTE — ED Triage Notes (Addendum)
Pt was self tested for covid and was positive on June 28th. Pt complains of headache, generalize weakness and feels dehydrated. Pt states that the headaches started about the same time os coved and have been intermittent. Pt  admits to some lightheadedness, but denis vision problems any issues with balance. Pt stated that she is also a type 2 diabetic, controls same with insulin.

## 2022-04-26 NOTE — Telephone Encounter (Signed)
Pt currently at the emergency room

## 2022-04-26 NOTE — ED Notes (Signed)
Discharge paperwork given and understood. 

## 2022-04-26 NOTE — ED Notes (Signed)
Pt decided to leave while waiting for a room.  

## 2022-04-26 NOTE — ED Provider Notes (Incomplete)
[facility] EMERGENCY DEPT Provider Note   CSN: [id number] Arrival date & time: 04/26/22  1643     History {Add pertinent medical, surgical, social history, OB history to HPI:1} Chief Complaint  Patient presents with   Weakness    Jessica Pace is a 61 y.o. female.   Weakness      Home Medications Prior to Admission medications   Medication Sig Start Date End Date Taking? Authorizing Provider  albuterol (VENTOLIN HFA) 108 (90 Base) MCG/ACT inhaler Inhale 2 puffs into the lungs every 6 (six) hours as needed for wheezing or shortness of breath. 11/29/21   Ladell Pier, MD  amLODipine (NORVASC) 5 MG tablet TAKE 1 TABLET (5 MG TOTAL) BY MOUTH DAILY. 04/25/22   Skeet Latch, MD  Ascorbic Acid (VITAMIN C) 1000 MG tablet Take 1,000 mg by mouth daily.    [provider]  atorvastatin (LIPITOR) 10 MG tablet Take 1 tablet (10 mg total) by mouth daily. 04/01/22   Ladell Pier, MD  benzonatate (TESSALON) 100 MG capsule Take 1 capsule (100 mg total) by mouth 3 (three) times daily as needed for cough. 04/14/22   Brunetta Jeans, PA-C  Blood Glucose Monitoring Suppl (ACCU-CHEK GUIDE ME) w/Device KIT See admin instructions. 09/10/18   [provider]  Cod Liver Oil CAPS Take 1 capsule by mouth every morning.    [provider]  fluticasone-salmeterol (ADVAIR DISKUS) 250-50 MCG/ACT AEPB Inhale 1 puff into the lungs in the morning and at bedtime. 03/01/22   Freddi Starr, MD  GARLIC OIL PO Take 1 tablet by mouth daily.    [provider]  Glucose Blood (BLOOD GLUCOSE TEST STRIPS) STRP Test twice a day. Pt uses one touch verio meter 01/18/17   Henson, Vickie L, NP-C  Insulin Pen Needle (PEN NEEDLES) 32G X 4 MM MISC 1 each by Other route daily. Use with Basalgar 12/04/17   Henson, Vickie L, NP-C  levothyroxine (SYNTHROID) 75 MCG tablet Take 75 mcg by mouth every morning. 04/02/21   [provider]  NOVOLOG MIX 70/30  FLEXPEN (70-30) 100 UNIT/ML FlexPen 64 units with breakfast, 34 units with lunch and 64 units with dinner subcut 11/29/21   Ladell Pier, MD  olmesartan-hydrochlorothiazide (BENICAR HCT) 40-25 MG tablet Take 1 tablet by mouth daily. 12/16/21   Skeet Latch, MD  [facility] DELICA LANCETS FINE MISC Test twice a daily. Pt uses a one touch verio meter 01/18/17   Henson, Vickie L, NP-C  Semaglutide,0.25 or 0.5MG/DOS, (OZEMPIC, 0.25 OR 0.5 MG/DOSE,) 2 MG/3ML SOPN Inject 0.25 mg into the skin once a week. For four weeks. If tolerable after 4 weeks, increase to the 0.5 mg weekly dose. 03/31/22   Ladell Pier, MD  solifenacin (VESICARE) 5 MG tablet TAKE 1 TABLET (5 MG TOTAL) BY MOUTH DAILY. 02/24/22   Ladell Pier, MD      Allergies    Canagliflozin, Dulaglutide, Insulin degludec, Metformin hcl, and Butyl acetate (n-butyl acetate)    Review of Systems   Review of Systems  Neurological:  Positive for weakness.    Physical Exam Updated Vital Signs BP 128/64 (BP Location: Left Arm)   Pulse 79   Temp 98.8 F (37.1 C)   Resp 18   Ht _0  (1.626 m)   Wt 109.8 kg   SpO2 96%   BMI 41.54 kg/m  Physical Exam  ED Results / Procedures / Treatments   Labs (all labs ordered are listed, but only abnormal  results are displayed) Labs Reviewed  SARS CORONAVIRUS 2 BY RT PCR - Abnormal; Notable for the following components:      Result Value   SARS Coronavirus 2 by RT PCR POSITIVE (*)    All other components within normal limits  CBG MONITORING, ED    EKG None  Radiology No results found.  Procedures Procedures  {Document cardiac monitor, telemetry assessment procedure when appropriate:1}  Medications Ordered in ED Medications - No data to display  ED Course/ Medical Decision Making/ A&P Clinical Course as of 04/26/22 1854  Tue Apr 26, 2022  1853 SARS Coronavirus 2 by RT PCR(!): POSITIVE [JL]    Clinical Course User Index [JL] Regan Lemming, MD                            Medical Decision Making Amount and/or Complexity of Data Reviewed Labs:  Decision-making details documented in ED Course.   ***  {Document critical care time when appropriate:1} {Document review of labs and clinical decision tools ie heart score, Chads2Vasc2 etc:1}  {Document your independent review of radiology images, and any outside records:1} {Document your discussion with family members, caretakers, and with consultants:1} {Document social determinants of health affecting pt's care:1} {Document your decision making why or why not admission, treatments were needed:1} Final Clinical Impression(s) / ED Diagnoses Final diagnoses:  None    Rx / DC Orders ED Discharge Orders     None

## 2022-04-26 NOTE — ED Triage Notes (Signed)
Pt arrived POV from home stating her dr told her to come here that she was dehydrated. Pt states she feels weak with no energy, dry mouth and thirsty.

## 2022-04-26 NOTE — Discharge Instructions (Signed)
Continue to push oral fluids. Your exam, EKG, Chest XR, and lab workup today was reassuring.

## 2022-04-26 NOTE — Telephone Encounter (Signed)
  Chief Complaint: weakness worsening  Symptoms: dry mouth, weakness, lightheadedness, urine smells strong Frequency: 04/24/22 Pertinent Negatives: Patient denies chest pain no difficulty breathing Disposition: '[x]'$ ED /'[]'$ Urgent Care (no appt availability in office) / '[]'$ Appointment(In office/virtual)/ '[]'$  [facility] [facility]/ '[]'$ Home Care/ '[]'$ Refused Recommended Disposition /'[]'$ [place] [facility]/ '[]'$  Follow-up with PCP Additional Notes:   Covid positive 04/13/22 and patient has not felt better since and now sx worsening.    Reason for Disposition  [1] Drinking very little AND [2] dehydration suspected (e.g., no urine > 12 hours, very dry mouth, very lightheaded)  Answer Assessment - Initial Assessment Questions 1. DESCRIPTION: "Describe how you are feeling."     Weakness worsening. Dry mouth, lightheadedness, urine smells strong 2. SEVERITY: "How bad is it?"  "Can you stand and walk?"   - MILD - Feels weak or tired, but does not interfere with work, school or normal activities   - [place] to stand and walk; weakness interferes with work, school, or normal activities   - SEVERE - Unable to stand or walk     Worsening weakness 3. ONSET:  "When did the weakness begin?"     Started after covid positive 04/13/22 and Sunday 04/24/22 worse 4. CAUSE: "What do you think is causing the weakness?"     dehydration 5. MEDICINES: "Have you recently started a new medicine or had a change in the amount of a medicine?"     na 6. OTHER SYMPTOMS: "Do you have any other symptoms?" (e.g., chest pain, fever, cough, SOB, vomiting, diarrhea, bleeding, other areas of pain)     Headache, lightheaded, weakness urine smells strong 7. PREGNANCY: "Is there any chance you are pregnant?" "When was your last menstrual period?"     na  Protocols used: Weakness (Generalized) and Fatigue-A-AH

## 2022-04-26 NOTE — ED Notes (Signed)
Pt added that she does have some bablance issues but thinks it is more related to weakness.

## 2022-05-16 ENCOUNTER — Ambulatory Visit (INDEPENDENT_AMBULATORY_CARE_PROVIDER_SITE_OTHER): Payer: 59 | Admitting: Cardiovascular Disease

## 2022-05-16 ENCOUNTER — Telehealth: Payer: Self-pay | Admitting: Internal Medicine

## 2022-05-16 ENCOUNTER — Encounter (HOSPITAL_BASED_OUTPATIENT_CLINIC_OR_DEPARTMENT_OTHER): Payer: Self-pay | Admitting: Cardiovascular Disease

## 2022-05-16 DIAGNOSIS — E669 Obesity, unspecified: Secondary | ICD-10-CM | POA: Diagnosis not present

## 2022-05-16 DIAGNOSIS — E1169 Type 2 diabetes mellitus with other specified complication: Secondary | ICD-10-CM

## 2022-05-16 DIAGNOSIS — Z006 Encounter for examination for normal comparison and control in clinical research program: Secondary | ICD-10-CM

## 2022-05-16 DIAGNOSIS — I10 Essential (primary) hypertension: Secondary | ICD-10-CM | POA: Diagnosis not present

## 2022-05-16 DIAGNOSIS — E785 Hyperlipidemia, unspecified: Secondary | ICD-10-CM

## 2022-05-16 MED ORDER — ATORVASTATIN CALCIUM 10 MG PO TABS: 10.0000 mg | ORAL_TABLET | Freq: Every day | ORAL | 3 refills | Status: DC

## 2022-05-16 NOTE — Assessment & Plan Note (Signed)
She has been struggling to lose weight.  She has appropriately been started on Ozempic and will start back exercising as well.

## 2022-05-16 NOTE — Telephone Encounter (Signed)
Wells Guiles with [facility] called saying the patient needs several specialist appts.  Endocrinology / [facility] , Type 2 diabetes Fax  [phone]  GI referral for Colonoscopy , [facility] Fax [phone]  [facility] @ [phone]

## 2022-05-16 NOTE — Assessment & Plan Note (Signed)
Lipids are poorly controlled.  Agree with Dr. Wynetta Emery that she is to be on atorvastatin.  She was unaware of the prescription.  We discussed importance of this medication and she is only to take it.  She is also going to reengage with exercise program as above.  She will start taking atorvastatin 10 mg and repeat lipids and CMP as prescribed by Dr. Wynetta Emery.

## 2022-05-16 NOTE — Progress Notes (Signed)
Advanced Hypertension Clinic Follow-up:    Date:  05/16/2022   ID:  Jessica Pace, DOB 1961/05/19, MRN [id number]  PCP:  Ladell Pier, MD  Cardiologist:  None  Nephrologist:  Referring MD: Ladell Pier, MD   CC: Hypertension  History of Present Illness:    Jessica Pace is a 61 y.o. female with a hx of hypertension, hypothyroidism, type 2 diabetes, and pneumonia, here for follow up. She was initially seen 12/16/2021 in the [facility] Clinic. She was seen in Urgent Care 07/30/2021 with symptoms concerning for lower respiratory infection. She saw her PCP 09/2021 and her BP was 138/84 on olmesartan.  She was first diagnosed with hypertension in 2013. 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 Her blood pressure has become more difficult to control in the last 6-7 months. Olmesartan and HCTZ were both increased.   She followed up with our pharmacist and her blood pressure in the office was controlled but was running high at home. Amlodipine was added, and she was referred to Endocrine for diabetes control. She started the PREP exercise program but had to stop after her ex-husband died. At her last visit with the pharmacist her blood pressures were much better controlled.  Today, she reports recovering from a recent COVID infection which did not require admittance to the hospital. She has almost recovered; sometimes she does feel fatigued by the end of the day. Also since her last visit her LE edema has resolved. For activity she has not been exercising as much as she would like. Unfortunately, since May she has been grieving 4 deaths in her family. She will get in touch with the PREP program when she is able to continue. Last week she started on the 0.5 mg dose of Ozempic. Her last LDL was 112 as of 03/2022. She denies any palpitations, chest pain, shortness of breath. No lightheadedness, headaches,  syncope, orthopnea, or PND.  Previous antihypertensives: None  Past Medical History:  Diagnosis Date   Complication of anesthesia    "takes me a long time to come out" (09/05/2016)   Hypertension    Pneumonia 1990's X 2; 2013   Type II diabetes mellitus ([place])     Past Surgical History:  Procedure Laterality Date   ABDOMINAL HYSTERECTOMY  1998   CARPAL TUNNEL RELEASE Right ~ 2012   TUBAL LIGATION  1994    Current Medications: Current Meds  Medication Sig   albuterol (VENTOLIN HFA) 108 (90 Base) MCG/ACT inhaler Inhale 2 puffs into the lungs every 6 (six) hours as needed for wheezing or shortness of breath.   amLODipine (NORVASC) 5 MG tablet TAKE 1 TABLET (5 MG TOTAL) BY MOUTH DAILY.   Ascorbic Acid (VITAMIN C) 1000 MG tablet Take 1,000 mg by mouth daily.   Blood Glucose Monitoring Suppl (ACCU-CHEK GUIDE ME) w/Device KIT See admin instructions.   Cod Liver Oil CAPS Take 1 capsule by mouth every morning.   fluticasone-salmeterol (ADVAIR DISKUS) 250-50 MCG/ACT AEPB Inhale 1 puff into the lungs in the morning and at bedtime.   GARLIC OIL PO Take 1 tablet by mouth daily.   Glucose Blood (BLOOD GLUCOSE TEST STRIPS) STRP Test twice a day. Pt uses one touch verio meter   Insulin Pen Needle (PEN NEEDLES) 32G X 4 MM MISC 1 each by Other route daily. Use with Basalgar   levothyroxine (SYNTHROID) 75 MCG tablet Take 75 mcg  by mouth every morning.   NOVOLOG MIX 70/30 FLEXPEN (70-30) 100 UNIT/ML FlexPen 64 units with breakfast, 34 units with lunch and 64 units with dinner subcut   olmesartan-hydrochlorothiazide (BENICAR HCT) 40-25 MG tablet Take 1 tablet by mouth daily.   ONETOUCH DELICA LANCETS FINE MISC Test twice a daily. Pt uses a one touch verio meter   Semaglutide,0.25 or 0.5MG/DOS, (OZEMPIC, 0.25 OR 0.5 MG/DOSE,) 2 MG/3ML SOPN Inject 0.25 mg into the skin once a week. For four weeks. If tolerable after 4 weeks, increase to the 0.5 mg weekly dose.   solifenacin (VESICARE) 5 MG tablet TAKE  1 TABLET (5 MG TOTAL) BY MOUTH DAILY.     Allergies:   Canagliflozin, Dulaglutide, Insulin degludec, Metformin hcl, and Butyl acetate (n-butyl acetate)   Social History   Socioeconomic History   Marital status: Legally Separated    Spouse name: Not on file   Number of children: 2   Years of education: Not on file   Highest education level: Not on file  Occupational History   Occupation: Visual merchandiser  Tobacco Use   Smoking status: Never   Smokeless tobacco: Never  Substance and Sexual Activity   Alcohol use: No   Drug use: No   Sexual activity: Not Currently    Birth control/protection: Surgical  Other Topics Concern   Not on file  Social History Narrative   Not on file   Social Determinants of Health   Financial Resource Strain: Low Risk  (12/16/2021)   Overall Financial Resource Strain (CARDIA)    Difficulty of Paying Living Expenses: Not hard at all  Food Insecurity: No Food Insecurity (12/16/2021)   Hunger Vital Sign    Worried About Running Out of Food in the Last Year: Never true    [place] in the Last Year: Never true  Transportation Needs: No Transportation Needs (12/16/2021)   PRAPARE - Hydrologist (Medical): No    Lack of Transportation (Non-Medical): No  Physical Activity: Insufficiently Active (12/16/2021)   Exercise Vital Sign    Days of Exercise per Week: 5 days    Minutes of Exercise per Session: 20 min  Stress: Not on file  Social Connections: Not on file     Family History: The patient's family history includes Diabetes in her mother; Heart failure in her maternal grandmother; Hyperlipidemia in her father; Hypertension in her father. There is no history of Colon cancer, Stomach cancer, Rectal cancer, Esophageal cancer, Liver cancer, or Breast cancer.  ROS:   Please see the history of present illness.    (+) Fatigue All other systems reviewed and are negative.  EKGs/Labs/Other Studies Reviewed:    CT  Chest  03/15/2022: FINDINGS: Cardiovascular: Atherosclerotic calcification of the aorta. Heart size normal. No pericardial effusion.   Mediastinum/Nodes: Mediastinal and axillary lymph nodes are not enlarged by CT size criteria. Hilar regions are difficult to evaluate without IV contrast. Esophagus is grossly unremarkable.   Lungs/Pleura: 6 mm apical segment right upper lobe nodule (3/27). 6 mm right lower lobe nodule (3/58). Additional scattered smaller millimetric pulmonary nodules. Negative for subpleural reticulation, traction bronchiectasis/bronchiolectasis, ground-glass, architectural distortion or honeycombing. No pleural fluid. Airway is unremarkable. No air trapping.   Upper Abdomen: Visualized portions of the liver, adrenal glands, kidneys, spleen, pancreas, stomach and bowel are grossly unremarkable.   Musculoskeletal: Degenerative changes in the spine. No worrisome lytic or sclerotic lesions.   IMPRESSION: 1. No evidence of interstitial lung disease. 2. Pulmonary  nodules measure up to 6 mm in the right upper and right lower lobes. Non-contrast chest CT at 3-6 months is recommended. If the nodules are stable at time of repeat CT, then future CT at 18-24 months (from today's scan) is considered optional for low-risk patients, but is recommended for high-risk patients. This recommendation follows the consensus statement: Guidelines for Management of Incidental Pulmonary Nodules Detected on CT Images: From the Fleischner Society 2017; Radiology 2017; 284:228-243. 3.  Aortic atherosclerosis (ICD10-I70.0).  Left LE Venous Doppler 06/14/2021: Summary:  Left:  - No evidence of deep vein thrombosis seen in the left lower extremity,  from the common femoral through the popliteal veins.  - No evidence of superficial venous thrombosis in the left lower  extremity.     - Venous reflux is noted in the left sapheno-femoral junction.   EKG:  EKG is personally  reviewed. 05/16/2022:  EKG was not ordered. 12/16/2021: EKG was not ordered.  Recent Labs: 11/29/2021: BNP 34.3; TSH 1.320 03/31/2022: ALT 43 04/26/2022: BUN 17; Creatinine, Ser 0.93; Hemoglobin 12.4; Magnesium 2.2; Platelets 276; Potassium 4.5; Sodium 138   Recent Lipid Panel    Component Value Date/Time   CHOL 193 03/31/2022 1625   TRIG 265 (H) 03/31/2022 1625   HDL 35 (L) 03/31/2022 1625   CHOLHDL 5.5 (H) 03/31/2022 1625   CHOLHDL 7.8 (H) 01/18/2017 1019   VLDL 72 (H) 01/18/2017 1019   LDLCALC 112 (H) 03/31/2022 1625    Physical Exam:    VS:  BP 114/72 (BP Location: Left Arm, Patient Position: Sitting, Cuff Size: Large)   Pulse 80   Ht _0  (1.626 m)   Wt 244 lb 9.6 oz (110.9 kg)   SpO2 98%   BMI 41.99 kg/m  , BMI Body mass index is 41.99 kg/m. GENERAL:  Well appearing HEENT: Pupils equal round and reactive, fundi not visualized, oral mucosa unremarkable NECK:  No jugular venous distention, waveform within normal limits, carotid upstroke brisk and symmetric, no bruits, no thyromegaly LUNGS: No crackles, rhonchi or wheezes HEART:  RRR.  PMI not displaced or sustained,S1 and S2 within normal limits, no S3, no S4, no clicks, no rubs, no murmurs ABD:  Flat, positive bowel sounds normal in frequency in pitch, no bruits, no rebound, no guarding, no midline pulsatile mass, no hepatomegaly, no splenomegaly EXT:  2 plus pulses throughout, no edema, no cyanosis no clubbing SKIN:  No rashes no nodules NEURO:  Cranial nerves II through XII grossly intact, motor grossly intact throughout PSYCH:  Cognitively intact, oriented to person place and time   ASSESSMENT/PLAN:    Essential hypertension Blood pressures have been very well controlled.  She was encouraged to get back into her exercise program and reengage with the prep program.  Continue amlodipine, olmesartan, and HCTZ.  Hyperlipidemia associated with type 2 diabetes mellitus ([place]) Lipids are poorly controlled.  Agree with Dr.  Wynetta Emery that she is to be on atorvastatin.  She was unaware of the prescription.  We discussed importance of this medication and she is only to take it.  She is also going to reengage with exercise program as above.  She will start taking atorvastatin 10 mg and repeat lipids and CMP as prescribed by Dr. Wynetta Emery.  Obesity (BMI 30-39.9) She has been struggling to lose weight.  She has appropriately been started on Ozempic and will start back exercising as well.   Screening for Secondary Hypertension:     12/16/2021    4:26 PM  Causes  Drugs/[place] Screened  Renovascular HTN N/A  Sleep Apnea Screened     - Comments Snores.  No other symptoms.  Thyroid Disease Screened  Hyperaldosteronism N/A  Cushing's Syndrome N/A  Hyperparathyroidism Screened  Coarctation of the Aorta Screened     - Comments BP symmetric  Compliance Screened    Relevant Labs/Studies:    Latest Ref Rng & Units 04/26/2022    6:55 PM 12/27/2021    9:40 AM 11/29/2021   11:46 AM  Basic Labs  Sodium 135 - 145 mmol/L 138  137  142   Potassium 3.5 - 5.1 mmol/L 4.5  4.5  4.3   Creatinine 0.44 - 1.00 mg/dL 0.93  1.00  0.83        Latest Ref Rng & Units 11/29/2021   11:46 AM 05/28/2018    3:39 PM  Thyroid   TSH 0.450 - 4.500 uIU/mL 1.320  5.190      Disposition:    FU with Brie Eppard C. Oval Linsey, MD, [facility] in 6 months.  Medication Adjustments/Labs and Tests Ordered: Current medicines are reviewed at length with the patient today.  Concerns regarding medicines are outlined above.   No orders of the defined types were placed in this encounter.  Meds ordered this encounter  Medications   atorvastatin (LIPITOR) 10 MG tablet    Sig: Take 1 tablet (10 mg total) by mouth daily.    Dispense:  90 tablet    Refill:  3   I,Mathew Stumpf,acting as a scribe for Skeet Latch, MD.,have documented all relevant documentation on the behalf of Skeet Latch, MD,as directed by  Skeet Latch, MD while in the presence of  Skeet Latch, MD.  I, [place] Oval Linsey, MD have reviewed all documentation for this visit.  The documentation of the exam, diagnosis, procedures, and orders on 05/16/2022 are all accurate and complete.   Signed, Skeet Latch, MD  05/16/2022 10:34 AM    [place]

## 2022-05-16 NOTE — Addendum Note (Signed)
Addended by: Skeet Latch C on: 05/16/2022 11:19 AM   Modules accepted: Orders

## 2022-05-16 NOTE — Assessment & Plan Note (Signed)
Blood pressures have been very well controlled.  She was encouraged to get back into her exercise program and reengage with the prep program.  Continue amlodipine, olmesartan, and HCTZ.

## 2022-05-16 NOTE — Patient Instructions (Signed)
Medication Instructions:  Your physician recommends that you continue on your current medications as directed. Please refer to the Current Medication list given to you today.   Labwork: NONE  Testing/Procedures: NONE  Follow-Up: Your physician wants you to follow-up in: [place]  You will receive a reminder letter in the mail two months in advance. If you don't receive a letter, please call our office to schedule the follow-up appointment.  If you need a refill on your cardiac medications before your next appointment, please call your pharmacy.

## 2022-05-16 NOTE — Research (Signed)
I saw pt today after Dr. Blenda Mounts follow up visit. Pt is in Dr. Blenda Mounts Virtual Care HTN Study. Pt filled out research survey. Pt was enrolled in Group 1. Pt has successfully reached her target blood pressures. She has successfully completed Dr. Blenda Mounts Virtual Care HTN Study.

## 2022-05-17 NOTE — Telephone Encounter (Signed)
Ms.Jessica Pace from [facility] called back stating her and the patient are aware of upcoming endocrinologist appointment but patient wanted to be seen sooner and Ms.Wells Guiles found a place (listed below) but they are needing a referral.

## 2022-05-17 NOTE — Telephone Encounter (Signed)
note

## 2022-05-17 NOTE — Telephone Encounter (Signed)
Called and spoke with Jessica Pace, and told him everything per doctors not about appointments. I gave aslo our office number for her to reach [place] if she has any question.

## 2022-05-19 ENCOUNTER — Telehealth: Payer: Self-pay | Admitting: Internal Medicine

## 2022-05-19 NOTE — Telephone Encounter (Signed)
Copied from [place] [id number]. Topic: General - Other >> May 19, 2022  2:43 PM Everette C wrote: Reason for CRM: Wells Guiles with [facility] called requesting to speak with Carly   Please contact further when possible

## 2022-05-20 NOTE — Telephone Encounter (Signed)
MS.Jessica Pace call and stated that the [facility] office is not taking any new patient and would like for you to send the referral to   [facility]  Dr Susette Racer  Fax [phone]

## 2022-05-29 ENCOUNTER — Other Ambulatory Visit: Payer: Self-pay | Admitting: Internal Medicine

## 2022-05-29 DIAGNOSIS — N3946 Mixed incontinence: Secondary | ICD-10-CM

## 2022-07-22 ENCOUNTER — Other Ambulatory Visit: Payer: Self-pay | Admitting: Internal Medicine

## 2022-07-22 DIAGNOSIS — E1169 Type 2 diabetes mellitus with other specified complication: Secondary | ICD-10-CM

## 2022-07-22 MED ORDER — NOVOLOG MIX 70/30 FLEXPEN (70-30) 100 UNIT/ML ~~LOC~~ SUPN: PEN_INJECTOR | SUBCUTANEOUS | 1 refills | Status: DC

## 2022-07-22 NOTE — Telephone Encounter (Signed)
Requested Prescriptions  Pending Prescriptions Disp Refills  . NOVOLOG MIX 70/30 FLEXPEN (70-30) 100 UNIT/ML FlexPen 15 mL 1    Sig: 64 units with breakfast, 34 units with lunch and 64 units with dinner subcut     Endocrinology:  Diabetes - Insulins Failed - 07/22/2022  2:14 PM      Failed - HBA1C is between 0 and 7.9 and within 180 days    Hemoglobin A1C  Date Value Ref Range Status  09/12/2016 7.7%  Final   HbA1c, POC (controlled diabetic range)  Date Value Ref Range Status  03/31/2022 8.9 (A) 0.0 - 7.0 % Final         Passed - Valid encounter within last 6 months    Recent Outpatient Visits          3 months ago Type 2 diabetes mellitus with morbid obesity ([place])   Baden Karle [place] B, MD   7 months ago Type 2 diabetes mellitus with morbid obesity [facility])   [place], MD      Future Appointments            In 3 months Wynetta Emery, Dalbert Batman, MD [place]

## 2022-07-22 NOTE — Telephone Encounter (Signed)
Medication Refill - Medication: Novalog 70/30  Has the patient contacted their pharmacy? Yes.   (Agent: If no, request that the patient contact the pharmacy for the refill. If patient does not wish to contact the pharmacy document the reason why and proceed with request.) (Agent: If yes, when and what did the pharmacy advise?)  Preferred Pharmacy (with phone number or street name): [facility] [address] Has the patient been seen for an appointment in the last year OR does the patient have an upcoming appointment? Yes.    Agent: Please be advised that RX refills may take up to 3 business days. We ask that you follow-up with your pharmacy.

## 2022-08-19 ENCOUNTER — Ambulatory Visit: Payer: 59 | Attending: Internal Medicine | Admitting: Internal Medicine

## 2022-08-19 ENCOUNTER — Telehealth: Payer: Self-pay | Admitting: Internal Medicine

## 2022-08-19 ENCOUNTER — Encounter: Payer: Self-pay | Admitting: Internal Medicine

## 2022-08-19 ENCOUNTER — Ambulatory Visit (HOSPITAL_COMMUNITY)
Admission: RE | Admit: 2022-08-19 | Discharge: 2022-08-19 | Disposition: A | Discharge status: Home / Self Care | Payer: 59 | Source: Ambulatory Visit | Attending: Internal Medicine | Admitting: Internal Medicine

## 2022-08-19 DIAGNOSIS — M79662 Pain in left lower leg: Secondary | ICD-10-CM | POA: Diagnosis present

## 2022-08-19 DIAGNOSIS — E785 Hyperlipidemia, unspecified: Secondary | ICD-10-CM

## 2022-08-19 DIAGNOSIS — M7989 Other specified soft tissue disorders: Secondary | ICD-10-CM

## 2022-08-19 DIAGNOSIS — E039 Hypothyroidism, unspecified: Secondary | ICD-10-CM | POA: Diagnosis not present

## 2022-08-19 DIAGNOSIS — E1169 Type 2 diabetes mellitus with other specified complication: Secondary | ICD-10-CM

## 2022-08-19 DIAGNOSIS — E1159 Type 2 diabetes mellitus with other circulatory complications: Secondary | ICD-10-CM | POA: Diagnosis not present

## 2022-08-19 DIAGNOSIS — I152 Hypertension secondary to endocrine disorders: Secondary | ICD-10-CM

## 2022-08-19 MED ORDER — SEMAGLUTIDE (1 MG/DOSE) 4 MG/3ML ~~LOC~~ SOPN: 1.0000 mg | PEN_INJECTOR | SUBCUTANEOUS | 2 refills | Status: DC

## 2022-08-19 NOTE — Progress Notes (Signed)
Left lower extremity venous duplex has been completed. Preliminary results can be found in CV Proc through chart review.  Results were given to Dr. Wynetta Emery.  08/19/22 3:15 PM Jessica Pace RVT

## 2022-08-19 NOTE — Progress Notes (Signed)
Patient ID: Jessica Pace, female    DOB: 06/05/1961  MRN: [id number]  CC: Hypertension and Diabetes (Previously 6.5 at [place]. )   Subjective: Jessica Pace is a 61 y.o. female who presents for chronic ds management Her concerns today include:  Patient with history of DM type II, HTN, hypothyroid, obesity, uterine CA (s/p hysterectomy 1998), vitamin D deficiency, venous reflux, aortic atherosclerosis, reactive airway dysfunction syndrome  DM/obesity: Saw endo 08/11/2022 with [facility] Dr. [place] Poli.  A1C was 6.4 Ozempic increased to 1 mg Qwk and Novolog 70/30 changed to 64 units BID.  Has f/u in Jan 2024 -no wgh changes since starting Ozempic 03/2022.  She feels she does well with her eating habits.  She stays away from sugary drinks and drinks mainly water.  Does a lot of walking at work.  Has not had any issues wit the breathing since I last saw her.  Has not had to use inhalers  HL/HTN: she stopped Norvasc 2 wks ago because she felt it was causing cough.  Cough stopped with d/c med. Continued on Benicar 40/25 mg Checks BP daily.  Highest has been 128/74 Limits salt in foods.  No CP/SOB.   -taking and tolerating Lipitor.    Feels swelling behind LT knee and medially.  Calf muscle feels tight.  Leg is a little swollen compared to the right.  Started 2 wks ago.  No injury or initiating factors  Hypothyroid:  taking and tolerating Levothyroxine.  HM:  declines flu shot and Shingrix #2 today because she is going out of town this weekend.  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  Patient Active Problem List   Diagnosis Date Noted   Aortic atherosclerosis ([place]) 03/31/2022   Mixed incontinence 11/29/2021   History of cancer of uterine body 06/14/2021   Hypothyroidism 06/14/2021   Pulmonary nodule 06/14/2021   Elevated LFTs 09/13/2016   Diverticulitis 09/04/2016   Type 2 diabetes mellitus with morbid obesity ([place]) 11/02/2015   History of hypothyroidism  09/01/2015   Obesity (BMI 30-39.9) 08/10/2015   Diabetes type 2, uncontrolled 08/10/2015   Essential hypertension 08/10/2015   History of vitamin D deficiency 08/10/2015   History of headache 08/10/2015   Uterine cancer ([place]) 04/07/2012   Mixed hyperlipidemia 02/28/2012   Vitamin D deficiency 02/28/2012     Current Outpatient Medications on File Prior to Visit  Medication Sig Dispense Refill   Ascorbic Acid (VITAMIN C) 1000 MG tablet Take 1,000 mg by mouth daily.     atorvastatin (LIPITOR) 10 MG tablet Take 1 tablet (10 mg total) by mouth daily. 90 tablet 3   Blood Glucose Monitoring Suppl (ACCU-CHEK GUIDE ME) w/Device KIT See admin instructions.     Cod Liver Oil CAPS Take 1 capsule by mouth every morning.     GARLIC OIL PO Take 1 tablet by mouth daily.     Glucose Blood (BLOOD GLUCOSE TEST STRIPS) STRP Test twice a day. Pt uses one touch verio meter 100 each 5   Insulin Pen Needle (PEN NEEDLES) 32G X 4 MM MISC 1 each by Other route daily. Use with Basalgar 30 each 0   levothyroxine (SYNTHROID) 75 MCG tablet Take 75 mcg by mouth every morning.     NOVOLOG MIX 70/30 FLEXPEN (70-30) 100 UNIT/ML FlexPen 64 units with breakfast, 34 units with lunch and 64 units with dinner subcut (Patient taking differently: 64 units with breakfast, and 64 units with dinner subcut) 15 mL 1  olmesartan-hydrochlorothiazide (BENICAR HCT) 40-25 MG tablet Take 1 tablet by mouth daily. 90 tablet 3   ONETOUCH DELICA LANCETS FINE MISC Test twice a daily. Pt uses a one touch verio meter 100 each 5   solifenacin (VESICARE) 5 MG tablet TAKE 1 TABLET (5 MG TOTAL) BY MOUTH DAILY. 90 tablet 0   albuterol (VENTOLIN HFA) 108 (90 Base) MCG/ACT inhaler Inhale 2 puffs into the lungs every 6 (six) hours as needed for wheezing or shortness of breath. (Patient not taking: Reported on 08/19/2022) 8 g 2   fluticasone-salmeterol (ADVAIR DISKUS) 250-50 MCG/ACT AEPB Inhale 1 puff into the lungs in the morning and at bedtime. (Patient  not taking: Reported on 08/19/2022) 60 each 6   No current facility-administered medications on file prior to visit.    Allergies  Allergen Reactions   Canagliflozin     Other reaction(s): yeast   Dulaglutide Other (See Comments)    Patient reports it caused flare of diverticulitis.   Insulin Degludec Other (See Comments)   Metformin Hcl Diarrhea and Other (See Comments)   Butyl Acetate (N-Butyl Acetate) Other (See Comments)    In paints and nail polish- unknown    Social History   Socioeconomic History   Marital status: Legally Separated    Spouse name: Not on file   Number of children: 2   Years of education: Not on file   Highest education level: Not on file  Occupational History   Occupation: Visual merchandiser  Tobacco Use   Smoking status: Never   Smokeless tobacco: Never  Vaping Use   Vaping Use: Never used  Substance and Sexual Activity   Alcohol use: No   Drug use: No   Sexual activity: Not Currently    Birth control/protection: Surgical  Other Topics Concern   Not on file  Social History Narrative   Not on file   Social Determinants of Health   Financial Resource Strain: Low Risk  (12/16/2021)   Overall Financial Resource Strain (CARDIA)    Difficulty of Paying Living Expenses: Not hard at all  Food Insecurity: No Food Insecurity (12/16/2021)   Hunger Vital Sign    Worried About Running Out of Food in the Last Year: Never true    [place] in the Last Year: Never true  Transportation Needs: No Transportation Needs (12/16/2021)   PRAPARE - Hydrologist (Medical): No    Lack of Transportation (Non-Medical): No  Physical Activity: Insufficiently Active (12/16/2021)   Exercise Vital Sign    Days of Exercise per Week: 5 days    Minutes of Exercise per Session: 20 min  Stress: Not on file  Social Connections: Not on file  Intimate Partner Violence: Not on file    Family History  Problem Relation Age of Onset    Diabetes Mother    Hyperlipidemia Father    Hypertension Father    Heart failure Maternal Grandmother    Colon cancer Neg Hx    Stomach cancer Neg Hx    Rectal cancer Neg Hx    Esophageal cancer Neg Hx    Liver cancer Neg Hx    Breast cancer Neg Hx     Past Surgical History:  Procedure Laterality Date   ABDOMINAL HYSTERECTOMY  1998   CARPAL TUNNEL RELEASE Right ~ 2012   TUBAL LIGATION  1994    ROS: Review of Systems Negative except as stated above  PHYSICAL EXAM: BP 111/69   Pulse 82  Temp 98 F (36.7 C) (Temporal)   Ht _0  (1.626 m)   Wt 243 lb 9.6 oz (110.5 kg)   SpO2 99%   BMI 41.81 kg/m   Wt Readings from Last 3 Encounters:  08/19/22 243 lb 9.6 oz (110.5 kg)  05/16/22 244 lb 9.6 oz (110.9 kg)  04/26/22 242 lb (109.8 kg)    Physical Exam  General appearance - alert, well appearing, and in no distress Mental status - normal mood, behavior, speech, dress, motor activity, and thought processes Neck - supple, no significant adenopathy Chest - clear to auscultation, no wheezes, rales or rhonchi, symmetric air entry Heart - normal rate, regular rhythm, normal S1, S2, no murmurs, rubs, clicks or gallops Musculoskeletal -patient has trace edema in the right lower extremity and trace to 1+ edema in the left lower extremity.  Mild tenderness on palpation of the left calf.  Mild tenderness on palpation in the left popliteal area.  No swelling noted of the knee joint itself.  Mild discomfort with passive range of motion of the left knee joint.  Dorsalis pedis and posterior tibialis pulses 3+ bilaterally.      Latest Ref Rng & Units 04/26/2022    6:55 PM 03/31/2022    4:25 PM 12/27/2021    9:40 AM  CMP  Glucose 70 - 99 mg/dL 86   181   BUN 8 - 23 mg/dL 17   21   Creatinine 0.44 - 1.00 mg/dL 0.93   1.00   Sodium 135 - 145 mmol/L 138   137   Potassium 3.5 - 5.1 mmol/L 4.5   4.5   Chloride 98 - 111 mmol/L 99   99   CO2 22 - 32 mmol/L 30   22   Calcium 8.9 - 10.3 mg/dL  10.3   9.7   Total Protein 6.0 - 8.5 g/dL  7.8    Total Bilirubin 0.0 - 1.2 mg/dL  0.2    Alkaline Phos 44 - 121 IU/L  79    AST 0 - 40 IU/L  25    ALT 0 - 32 IU/L  43     Lipid Panel     Component Value Date/Time   CHOL 193 03/31/2022 1625   TRIG 265 (H) 03/31/2022 1625   HDL 35 (L) 03/31/2022 1625   CHOLHDL 5.5 (H) 03/31/2022 1625   CHOLHDL 7.8 (H) 01/18/2017 1019   VLDL 72 (H) 01/18/2017 1019   LDLCALC 112 (H) 03/31/2022 1625    CBC    Component Value Date/Time   WBC 7.1 04/26/2022 1855   RBC 4.35 04/26/2022 1855   HGB 12.4 04/26/2022 1855   HGB 12.7 11/29/2021 1146   HCT 37.9 04/26/2022 1855   HCT 38.1 11/29/2021 1146   PLT 276 04/26/2022 1855   PLT 213 11/29/2021 1146   MCV 87.1 04/26/2022 1855   MCV 85 11/29/2021 1146   MCH 28.5 04/26/2022 1855   MCHC 32.7 04/26/2022 1855   RDW 14.6 04/26/2022 1855   RDW 14.7 11/29/2021 1146   LYMPHSABS 2.3 04/26/2022 1855   LYMPHSABS 2.2 05/28/2018 1539   MONOABS 0.6 04/26/2022 1855   EOSABS 0.1 04/26/2022 1855   EOSABS 0.1 05/28/2018 1539   BASOSABS 0.1 04/26/2022 1855   BASOSABS 0.0 05/28/2018 1539    ASSESSMENT AND PLAN:  1. Type 2 diabetes mellitus with morbid obesity ([place]) She has been plugged in with endocrinology through [facility].  Most recent A1c at goal.  Ozempic recently increased to 1 mg.  NovoLog 70/30 changed to 64 units twice a day.  Encouraged her to continue healthy eating and trying to move as much as she can.  Hopefully by next visit she would have attained some weight loss with the recent increase in Ozempic. - Semaglutide, 1 MG/DOSE, 4 MG/3ML SOPN; Inject 1 mg as directed once a week.  Dispense: 3 mL; Refill: 2  2. Hypertension associated with diabetes ([place]) At goal.  Continue Benicar.  Norvasc taken off med list  3. Hyperlipidemia associated with type 2 diabetes mellitus ([facility]) Continue atorvastatin. - Hepatic Function Panel  4. Acquired hypothyroidism Continue levothyroxine - TSH  5. Pain  and swelling of lower leg, left Differential diagnoses include DVT versus Baker's cyst in the popliteal area.  Sent for Doppler ultrasound.  Further management will be based on results. - VAS [place] LOWER EXTREMITY VENOUS (DVT); Future      Patient was given the opportunity to ask questions.  Patient verbalized understanding of the plan and was able to repeat key elements of the plan.   This documentation was completed using Radio producer.  Any transcriptional errors are unintentional.  Orders Placed This Encounter  Procedures   Hepatic Function Panel   TSH   VAS [place] LOWER EXTREMITY VENOUS (DVT)     Requested Prescriptions   Signed Prescriptions Disp Refills   Semaglutide, 1 MG/DOSE, 4 MG/3ML SOPN 3 mL 2    Sig: Inject 1 mg as directed once a week.    Return in about 2 months (around 10/19/2022) for physical.  Karle Plumber, MD, Rosalita Chessman

## 2022-08-19 NOTE — Telephone Encounter (Signed)
Phone call placed to patient this evening.  Patient informed that the Doppler ultrasound on the left leg was negative for DVT and Baker's cyst.  However it did show some findings to suggest that she likely had a ruptured Baker's cyst causing the soreness and swelling in the calf muscle.  I recommend elevation and use of Advil for several days.  If no improvement or any worsening in symptoms, she should let me know as we can then refer to orthopedics.  Patient advised that this was the preliminary results.  If the final report is any different, I will let her know.

## 2022-08-20 LAB — HEPATIC FUNCTION PANEL
ALT: 31 IU/L (ref 0–32)
AST: 24 IU/L (ref 0–40)
Albumin: 4.7 g/dL (ref 3.9–4.9)
Alkaline Phosphatase: 68 IU/L (ref 44–121)
Bilirubin Total: 0.4 mg/dL (ref 0.0–1.2)
Bilirubin, Direct: 0.11 mg/dL (ref 0.00–0.40)
Total Protein: 7.9 g/dL (ref 6.0–8.5)

## 2022-08-20 LAB — TSH: TSH: 1.34 u[IU]/mL (ref 0.450–4.500)

## 2022-08-25 ENCOUNTER — Telehealth: Payer: Self-pay | Admitting: Emergency Medicine

## 2022-08-25 NOTE — Telephone Encounter (Signed)
[place] report has been faxed over to requested number.

## 2022-08-25 NOTE — Telephone Encounter (Signed)
Copied from [place] [phone]. Topic: Appointment Scheduling - Scheduling Inquiry for Clinic >> Aug 25, 2022 11:43 AM Erskine Squibb wrote: Reason for CRM: Wells Guiles a concierge with [facility] called in on behalf of the patient stating she has set up an appt with Orthopedist,  Dr Ronnie Derby on 11/21 and she would like for the report of the patients recent Ultrasound to be faxed over to Dr Manfred Arch office at [phone] so they have it for her appt coming up. Please assist further >> Aug 25, 2022 11:48 AM Erskine Squibb wrote: Wells Guiles is actaully a [employer]ist [employer] who performs concierge services.

## 2022-09-04 ENCOUNTER — Other Ambulatory Visit: Payer: Self-pay | Admitting: Internal Medicine

## 2022-09-04 DIAGNOSIS — N3946 Mixed incontinence: Secondary | ICD-10-CM

## 2022-09-05 ENCOUNTER — Other Ambulatory Visit: Payer: Self-pay | Admitting: Pulmonary Disease

## 2022-09-05 DIAGNOSIS — J683 Other acute and subacute respiratory conditions due to chemicals, gases, fumes and vapors: Secondary | ICD-10-CM

## 2022-09-13 ENCOUNTER — Other Ambulatory Visit: Payer: Self-pay | Admitting: Orthopedic Surgery

## 2022-09-13 DIAGNOSIS — M25562 Pain in left knee: Secondary | ICD-10-CM

## 2022-09-27 ENCOUNTER — Ambulatory Visit: Payer: 59 | Admitting: Internal Medicine

## 2022-09-30 ENCOUNTER — Ambulatory Visit
Admission: RE | Admit: 2022-09-30 | Discharge: 2022-09-30 | Disposition: A | Discharge status: Home / Self Care | Payer: 59 | Source: Ambulatory Visit | Attending: Orthopedic Surgery | Admitting: Orthopedic Surgery

## 2022-09-30 ENCOUNTER — Ambulatory Visit: Payer: 59 | Admitting: Internal Medicine

## 2022-09-30 DIAGNOSIS — M25562 Pain in left knee: Secondary | ICD-10-CM

## 2022-10-06 ENCOUNTER — Ambulatory Visit
Admission: RE | Admit: 2022-10-06 | Discharge: 2022-10-06 | Disposition: A | Discharge status: Home / Self Care | Payer: 59 | Source: Ambulatory Visit | Attending: Critical Care Medicine | Admitting: Critical Care Medicine

## 2022-10-06 ENCOUNTER — Ambulatory Visit: Payer: 59 | Attending: Critical Care Medicine | Admitting: Critical Care Medicine

## 2022-10-06 ENCOUNTER — Encounter: Payer: Self-pay | Admitting: Critical Care Medicine

## 2022-10-06 VITALS — BP 118/76 | HR 84 | Temp 98.5°F | Resp 20 | Ht 64.0 in | Wt 234.0 lb

## 2022-10-06 DIAGNOSIS — R062 Wheezing: Secondary | ICD-10-CM

## 2022-10-06 DIAGNOSIS — J208 Acute bronchitis due to other specified organisms: Secondary | ICD-10-CM | POA: Diagnosis not present

## 2022-10-06 DIAGNOSIS — R0609 Other forms of dyspnea: Secondary | ICD-10-CM

## 2022-10-06 DIAGNOSIS — J683 Other acute and subacute respiratory conditions due to chemicals, gases, fumes and vapors: Secondary | ICD-10-CM

## 2022-10-06 MED ORDER — FLUTICASONE-SALMETEROL 250-50 MCG/ACT IN AEPB: 1.0000 | INHALATION_SPRAY | Freq: Two times a day (BID) | RESPIRATORY_TRACT | 2 refills | Status: DC

## 2022-10-06 MED ORDER — DOXYCYCLINE HYCLATE 100 MG PO TABS: 100.0000 mg | ORAL_TABLET | Freq: Two times a day (BID) | ORAL | 0 refills | Status: DC

## 2022-10-06 MED ORDER — ALBUTEROL SULFATE HFA 108 (90 BASE) MCG/ACT IN AERS: 2.0000 | INHALATION_SPRAY | Freq: Four times a day (QID) | RESPIRATORY_TRACT | 2 refills | Status: DC | PRN

## 2022-10-06 NOTE — Progress Notes (Signed)
Established Patient Office Visit  Subjective   Patient ID: Jessica Pace, female    DOB: 03/23/1961  Age: 61 y.o. MRN: [id number]  Chief Complaint  Patient presents with   URI    9     This is a pleasant 61 year old female last seen in November by Dr. Wynetta Emery for primary care comes in today with concerns over a COVID-like illness.  She had temperature of 101 a week ago Wednesday then began having a cough productive of green to light yellow mucus.  Fever is gone down since that time.  She had a video visit same day the symptoms started.  She was prescribed Augmentin which she is finishing now.  She was given benzonatate unsure if it has been of any benefit.  No testing was done.      Review of Systems  Constitutional:  Negative for chills, diaphoresis, fever, malaise/fatigue and weight loss.  HENT:  Positive for congestion. Negative for hearing loss, nosebleeds, sore throat and tinnitus.   Eyes:  Negative for blurred vision, photophobia and redness.  Respiratory:  Positive for cough, sputum production, shortness of breath and wheezing. Negative for hemoptysis and stridor.   Cardiovascular:  Negative for chest pain, palpitations, orthopnea, claudication, leg swelling and PND.  Gastrointestinal:  Negative for abdominal pain, blood in stool, constipation, diarrhea, heartburn, nausea and vomiting.  Genitourinary:  Negative for dysuria, flank pain, frequency, hematuria and urgency.  Musculoskeletal:  Negative for back pain, falls, joint pain, myalgias and neck pain.  Skin:  Negative for itching and rash.  Neurological:  Negative for dizziness, tingling, tremors, sensory change, speech change, focal weakness, seizures, loss of consciousness, weakness and headaches.  Endo/Heme/Allergies:  Negative for environmental allergies and polydipsia. Does not bruise/bleed easily.  Psychiatric/Behavioral:  Negative for depression, memory loss, substance abuse and suicidal ideas. The patient is not  nervous/anxious and does not have insomnia.       Objective:     BP 118/76 (BP Location: Left Arm, Patient Position: Sitting, Cuff Size: Large)   Pulse 84   Temp 98.5 F (36.9 C) (Oral)   Resp 20   Ht '5\' 4"'$  (1.626 m)   Wt 234 lb (106.1 kg)   SpO2 100%   BMI 40.17 kg/m    Physical Exam Vitals reviewed.  Constitutional:      Appearance: Normal appearance. She is well-developed. She is obese. She is not diaphoretic.  HENT:     Head: Normocephalic and atraumatic.     Nose: Congestion and rhinorrhea present. No nasal deformity, septal deviation or mucosal edema.     Right Sinus: No maxillary sinus tenderness or frontal sinus tenderness.     Left Sinus: No maxillary sinus tenderness or frontal sinus tenderness.     Mouth/Throat:     Mouth: Mucous membranes are moist.     Pharynx: Oropharynx is clear. Posterior oropharyngeal erythema present. No oropharyngeal exudate.  Eyes:     General: No scleral icterus.    Conjunctiva/sclera: Conjunctivae normal.     Pupils: Pupils are equal, round, and reactive to light.  Neck:     Thyroid: No thyromegaly.     Vascular: No carotid bruit or JVD.     Trachea: Trachea normal. No tracheal tenderness or tracheal deviation.  Cardiovascular:     Rate and Rhythm: Normal rate and regular rhythm.     Chest Wall: PMI is not displaced.     Pulses: Normal pulses. No decreased pulses.     Heart sounds: Normal  heart sounds, S1 normal and S2 normal. Heart sounds not distant. No murmur heard.    No systolic murmur is present.     No diastolic murmur is present.     No friction rub. No gallop. No S3 or S4 sounds.  Pulmonary:     Effort: Pulmonary effort is normal. No tachypnea, accessory muscle usage or respiratory distress.     Breath sounds: No stridor. Wheezing and rhonchi present. No decreased breath sounds or rales.  Chest:     Chest wall: No tenderness.  Abdominal:     General: Bowel sounds are normal. There is no distension.     Palpations:  Abdomen is soft. Abdomen is not rigid.     Tenderness: There is no abdominal tenderness. There is no guarding or rebound.  Musculoskeletal:        General: Normal range of motion.     Cervical back: Normal range of motion and neck supple. No edema, erythema or rigidity. No muscular tenderness. Normal range of motion.  Lymphadenopathy:     Head:     Right side of head: No submental or submandibular adenopathy.     Left side of head: No submental or submandibular adenopathy.     Cervical: No cervical adenopathy.  Skin:    General: Skin is warm and dry.     Coloration: Skin is not pale.     Findings: No rash.     Nails: There is no clubbing.  Neurological:     Mental Status: She is alert and oriented to person, place, and time.     Sensory: No sensory deficit.  Psychiatric:        Speech: Speech normal.        Behavior: Behavior normal.      No results found for any visits on 10/06/22.    The 10-year ASCVD risk score (Arnett DK, et al., 2019) is: 17.2%    Assessment & Plan:   Problem List Items Addressed This Visit       Respiratory   Acute bronchitis due to other specified organisms - Primary    Acute tracheobronchitis will obtain chest x-ray will discontinue Augmentin and begin doxycycline 100 mg twice daily for 7 days  Patient told to resume her Advair inhaler  Patient will be given Mucinex DM for cough suppression.      Relevant Orders   DG Chest 2 View   Reactive airways dysfunction syndrome ([place])    Plan to resume Advair      Relevant Medications   fluticasone-salmeterol (ADVAIR DISKUS) 250-50 MCG/ACT AEPB   Other Relevant Orders   DG Chest 2 View   Other Visit Diagnoses     Wheezing       Relevant Medications   albuterol (VENTOLIN HFA) 108 (90 Base) MCG/ACT inhaler   DOE (dyspnea on exertion)       Relevant Medications   albuterol (VENTOLIN HFA) 108 (90 Base) MCG/ACT inhaler     Patient received a COVID screening doubt that this is  COVID  Return if symptoms worsen or fail to improve.    Asencion Noble, MD

## 2022-10-06 NOTE — Progress Notes (Signed)
Cough Low grade fever since Friday Given ATB on Wednesday for a televisit appt.

## 2022-10-06 NOTE — Assessment & Plan Note (Signed)
Acute tracheobronchitis will obtain chest x-ray will discontinue Augmentin and begin doxycycline 100 mg twice daily for 7 days  Patient told to resume her Advair inhaler  Patient will be given Mucinex DM for cough suppression.

## 2022-10-06 NOTE — Patient Instructions (Signed)
Stop amoxicillin clavulanic acid antibiotic and start doxycycline 1 twice daily for 7 days  Pick up Mucinex DM and take this at night for cough suppression  Chest x-ray will be obtained  Stay in isolation till we get your COVID result back  Keep your upcoming appointment with Dr. Wynetta Emery for physical

## 2022-10-06 NOTE — Assessment & Plan Note (Signed)
Plan to resume Advair

## 2022-10-10 NOTE — Progress Notes (Signed)
Let pt know CXR was negative for pneuonia

## 2022-10-11 ENCOUNTER — Telehealth: Payer: Self-pay

## 2022-10-11 NOTE — Telephone Encounter (Signed)
Pt was called and is aware of results, DOB was confirmed.  ?

## 2022-10-11 NOTE — Telephone Encounter (Signed)
-----   Message from Elsie Stain, MD sent at 10/10/2022 12:49 PM EST ----- Let pt know CXR was negative for pneuonia

## 2022-10-20 ENCOUNTER — Other Ambulatory Visit (HOSPITAL_BASED_OUTPATIENT_CLINIC_OR_DEPARTMENT_OTHER): Payer: Self-pay | Admitting: Cardiovascular Disease

## 2022-10-21 ENCOUNTER — Ambulatory Visit: Payer: 59 | Attending: Internal Medicine | Admitting: Internal Medicine

## 2022-10-21 ENCOUNTER — Encounter: Payer: Self-pay | Admitting: Internal Medicine

## 2022-10-21 VITALS — BP 96/64 | HR 84 | Temp 98.1°F | Ht 64.0 in | Wt 236.0 lb

## 2022-10-21 DIAGNOSIS — E1159 Type 2 diabetes mellitus with other circulatory complications: Secondary | ICD-10-CM

## 2022-10-21 DIAGNOSIS — Z0000 Encounter for general adult medical examination without abnormal findings: Secondary | ICD-10-CM

## 2022-10-21 DIAGNOSIS — E1169 Type 2 diabetes mellitus with other specified complication: Secondary | ICD-10-CM

## 2022-10-21 DIAGNOSIS — Z7985 Long-term (current) use of injectable non-insulin antidiabetic drugs: Secondary | ICD-10-CM

## 2022-10-21 DIAGNOSIS — I152 Hypertension secondary to endocrine disorders: Secondary | ICD-10-CM | POA: Diagnosis not present

## 2022-10-21 DIAGNOSIS — Z23 Encounter for immunization: Secondary | ICD-10-CM

## 2022-10-21 DIAGNOSIS — Z0001 Encounter for general adult medical examination with abnormal findings: Secondary | ICD-10-CM | POA: Diagnosis not present

## 2022-10-21 DIAGNOSIS — Z6841 Body Mass Index (BMI) 40.0 and over, adult: Secondary | ICD-10-CM

## 2022-10-21 DIAGNOSIS — Z794 Long term (current) use of insulin: Secondary | ICD-10-CM

## 2022-10-21 NOTE — Patient Instructions (Signed)

## 2022-10-21 NOTE — Progress Notes (Signed)
Patient ID: Jessica Pace, female    DOB: [birth year]  MRN: [id number]  CC: Annual Exam (Physical. [place] flu vax. )   Subjective: Jessica Pace is a 62 y.o. female who presents for physical Her concerns today include:  Patient with history of DM type II, HTN, hypothyroid, obesity, uterine CA (s/p hysterectomy 1998), vitamin D deficiency, venous reflux, aortic atherosclerosis, reactive airway dysfunction syndrome   HM:  Would like to receive flu shot.  Would like to get 2nd Shingrix but not today.  Due for COVID booster; does plan to get it.   Has tear in LT knee.  Arthroscopic surgery planned for 12/07/2022 by Dr Lorre Nick with [facility].    Patient Active Problem List   Diagnosis Date Noted   Acute bronchitis due to other specified organisms 10/06/2022   Reactive airways dysfunction syndrome ([place]) 10/06/2022   Aortic atherosclerosis ([place]) 03/31/2022   Mixed incontinence 11/29/2021   History of cancer of uterine body 06/14/2021   Hypothyroidism 06/14/2021   Pulmonary nodule 06/14/2021   Elevated LFTs 09/13/2016   Diverticulitis 09/04/2016   Type 2 diabetes mellitus with morbid obesity ([place]) 11/02/2015   History of hypothyroidism 09/01/2015   Obesity (BMI 30-39.9) 08/10/2015   Diabetes type 2, uncontrolled 08/10/2015   Essential hypertension 08/10/2015   History of vitamin D deficiency 08/10/2015   History of headache 08/10/2015   Mixed hyperlipidemia 02/28/2012   Vitamin D deficiency 02/28/2012     Current Outpatient Medications on File Prior to Visit  Medication Sig Dispense Refill   albuterol (VENTOLIN HFA) 108 (90 Base) MCG/ACT inhaler Inhale 2 puffs into the lungs every 6 (six) hours as needed for wheezing or shortness of breath. 8 g 2   Ascorbic Acid (VITAMIN C) 1000 MG tablet Take 1,000 mg by mouth daily.     atorvastatin (LIPITOR) 10 MG tablet Take 1 tablet (10 mg total) by mouth daily. 90 tablet 3   Blood Glucose Monitoring Suppl (ACCU-CHEK GUIDE ME) w/Device KIT See  admin instructions.     Cod Liver Oil CAPS Take 1 capsule by mouth every morning.     fluticasone-salmeterol (ADVAIR DISKUS) 250-50 MCG/ACT AEPB Inhale 1 puff into the lungs 2 (two) times daily. in the morning and at bedtime. 258 each 2   GARLIC OIL PO Take 1 tablet by mouth daily.     Glucose Blood (BLOOD GLUCOSE TEST STRIPS) STRP Test twice a day. Pt uses one touch verio meter 100 each 5   Insulin Pen Needle (PEN NEEDLES) 32G X 4 MM MISC 1 each by Other route daily. Use with Basalgar 30 each 0   levothyroxine (SYNTHROID) 75 MCG tablet Take 75 mcg by mouth every morning.     NOVOLOG MIX 70/30 FLEXPEN (70-30) 100 UNIT/ML FlexPen 64 units with breakfast, 34 units with lunch and 64 units with dinner subcut (Patient taking differently: 64 units with breakfast, and 64 units with dinner subcut) 15 mL 1   olmesartan-hydrochlorothiazide (BENICAR HCT) 40-25 MG tablet Take 1 tablet by mouth daily. 90 tablet 3   ONETOUCH DELICA LANCETS FINE MISC Test twice a daily. Pt uses a one touch verio meter 100 each 5   Semaglutide, 1 MG/DOSE, 4 MG/3ML SOPN Inject 1 mg as directed once a week. 3 mL 2   solifenacin (VESICARE) 5 MG tablet TAKE 1 TABLET (5 MG TOTAL) BY MOUTH DAILY. 90 tablet 0   No current facility-administered medications on file prior to visit.    Allergies  Allergen Reactions  Canagliflozin     Other reaction(s): yeast   Dulaglutide Other (See Comments)    Patient reports it caused flare of diverticulitis.   Insulin Degludec Other (See Comments)   Metformin Hcl Diarrhea and Other (See Comments)   Butyl Acetate (N-Butyl Acetate) Other (See Comments)    In paints and nail polish- unknown    Social History   Socioeconomic History   Marital status: Legally Separated    Spouse name: Not on file   Number of children: 2   Years of education: Not on file   Highest education level: Not on file  Occupational History   Occupation: Visual merchandiser  Tobacco Use   Smoking status: Never    Smokeless tobacco: Never  Vaping Use   Vaping Use: Never used  Substance and Sexual Activity   Alcohol use: No   Drug use: No   Sexual activity: Not Currently    Birth control/protection: Surgical  Other Topics Concern   Not on file  Social History Narrative   Not on file   Social Determinants of Health   Financial Resource Strain: Low Risk  (12/16/2021)   Overall Financial Resource Strain (CARDIA)    Difficulty of Paying Living Expenses: Not hard at all  Food Insecurity: No Food Insecurity (12/16/2021)   Hunger Vital Sign    Worried About Running Out of Food in the Last Year: Never true    [place] in the Last Year: Never true  Transportation Needs: No Transportation Needs (12/16/2021)   PRAPARE - Hydrologist (Medical): No    Lack of Transportation (Non-Medical): No  Physical Activity: Insufficiently Active (12/16/2021)   Exercise Vital Sign    Days of Exercise per Week: 5 days    Minutes of Exercise per Session: 20 min  Stress: Not on file  Social Connections: Not on file  Intimate Partner Violence: Not on file    Family History  Problem Relation Age of Onset   Diabetes Mother    Hyperlipidemia Father    Hypertension Father    Heart failure Maternal Grandmother    Colon cancer Neg Hx    Stomach cancer Neg Hx    Rectal cancer Neg Hx    Esophageal cancer Neg Hx    Liver cancer Neg Hx    Breast cancer Neg Hx     Past Surgical History:  Procedure Laterality Date   ABDOMINAL HYSTERECTOMY  1998   CARPAL TUNNEL RELEASE Right ~ 2012   TUBAL LIGATION  1994    ROS: Review of Systems  Constitutional:        Patient walks daily 20 minutes for exercise.  She feels she is doing well with her eating habits. She checks breast exam regularly in the shower.  She is up-to-date with mammogram. She has history of uterine cancer and had hysterectomy in 1998.  HENT:  Negative for ear pain, hearing loss and sore throat.        She gets routine  dental care twice a year.  Eyes:        Wears contacts and glasses.  She gets vision exams through my eye doctor.  She is due for diabetic eye exam.  She plans to call and schedule herself.  Respiratory:  Negative for shortness of breath.   Cardiovascular:  Negative for chest pain.       She continues to take her blood pressure medication Benicar.  She has not checked blood pressure lately.  She limits salt in the foods.  Gastrointestinal:        Moving bowels okay day.  No blood in the stools.  Endocrine:       She is still on Ozempic 1 mg weekly.  Weight is down 7 pounds since last visit with me.  She feels that her appetite has decreased some with the Ozempic.  Last A1c was 6.5 at the end of October.  She has follow-up with her endocrinologist in February of this year.  Doing well with eating habits. She checks blood sugars twice a day.  Range for morning is 90-134 and evenings 114-120.   Negative except as stated above  PHYSICAL EXAM: BP 96/64 (BP Location: Left Arm, Patient Position: Sitting, Cuff Size: Large)   Pulse 84   Temp 98.1 F (36.7 C) (Oral)   Ht '5\' 4"'$  (1.626 m)   Wt 236 lb (107 kg)   SpO2 97%   BMI 40.51 kg/m   Wt Readings from Last 3 Encounters:  10/21/22 236 lb (107 kg)  10/06/22 234 lb (106.1 kg)  08/19/22 243 lb 9.6 oz (110.5 kg)    Physical Exam  General appearance - alert, well appearing, older African-American female and in no distress Mental status - normal mood, behavior, speech, dress, motor activity, and thought processes Eyes - pupils equal and reactive, extraocular eye movements intact Ears - bilateral TM's and external ear canals normal Nose - normal and patent, no erythema, discharge or polyps Mouth - mucous membranes moist, pharynx normal without lesions Neck - supple, no significant adenopathy Lymphatics - no palpable lymphadenopathy, no hepatosplenomegaly Chest - clear to auscultation, no wheezes, rales or rhonchi, symmetric air entry Heart -  normal rate, regular rhythm, normal S1, S2, no murmurs, rubs, clicks or gallops Abdomen - soft, nontender, nondistended, no masses or organomegaly Breasts -patient deferred on this.  She is up-to-date with mammograms. Extremities -no lower extremity edema.      Latest Ref Rng & Units 08/19/2022   11:43 AM 04/26/2022    6:55 PM 03/31/2022    4:25 PM  CMP  Glucose 70 - 99 mg/dL  86    BUN 8 - 23 mg/dL  17    Creatinine 0.44 - 1.00 mg/dL  0.93    Sodium 135 - 145 mmol/L  138    Potassium 3.5 - 5.1 mmol/L  4.5    Chloride 98 - 111 mmol/L  99    CO2 22 - 32 mmol/L  30    Calcium 8.9 - 10.3 mg/dL  10.3    Total Protein 6.0 - 8.5 g/dL 7.9   7.8   Total Bilirubin 0.0 - 1.2 mg/dL 0.4   0.2   Alkaline Phos 44 - 121 IU/L 68   79   AST 0 - 40 IU/L 24   25   ALT 0 - 32 IU/L 31   43    Lipid Panel     Component Value Date/Time   CHOL 193 03/31/2022 1625   TRIG 265 (H) 03/31/2022 1625   HDL 35 (L) 03/31/2022 1625   CHOLHDL 5.5 (H) 03/31/2022 1625   CHOLHDL 7.8 (H) 01/18/2017 1019   VLDL 72 (H) 01/18/2017 1019   LDLCALC 112 (H) 03/31/2022 1625    CBC    Component Value Date/Time   WBC 7.1 04/26/2022 1855   RBC 4.35 04/26/2022 1855   HGB 12.4 04/26/2022 1855   HGB 12.7 11/29/2021 1146   HCT 37.9 04/26/2022 1855   HCT 38.1  11/29/2021 1146   PLT 276 04/26/2022 1855   PLT 213 11/29/2021 1146   MCV 87.1 04/26/2022 1855   MCV 85 11/29/2021 1146   MCH 28.5 04/26/2022 1855   MCHC 32.7 04/26/2022 1855   RDW 14.6 04/26/2022 1855   RDW 14.7 11/29/2021 1146   LYMPHSABS 2.3 04/26/2022 1855   LYMPHSABS 2.2 05/28/2018 1539   MONOABS 0.6 04/26/2022 1855   EOSABS 0.1 04/26/2022 1855   EOSABS 0.1 05/28/2018 1539   BASOSABS 0.1 04/26/2022 1855   BASOSABS 0.0 05/28/2018 1539    ASSESSMENT AND PLAN: 1. Annual physical exam   2. Type 2 diabetes mellitus with morbid obesity ([place]) At goal.  She will continue Ozempic and NovoLog 70/30 insulin 64 units twice a day as prescribed by her  endocrinologist. Encouraged her to continue healthy eating habits and regular exercise.  3. Hypertension associated with diabetes ([place]) Controlled on Benicar.  4. Need for immunization against influenza Given today. - Flu Vaccine QUAD 62moIM (Fluarix, Fluzone & Alfiuria Quad PF)  5. Need for shingles vaccine Schedule to come back to see the clinical pharmacist in 1 to 2 weeks to get second Shingrix vaccine.  Addendum: Patient did agree for HIV screening test but we forgot to order this.  Will address on next visit.   Patient was given the opportunity to ask questions.  Patient verbalized understanding of the plan and was able to repeat key elements of the plan.   This documentation was completed using DRadio producer  Any transcriptional errors are unintentional.  Orders Placed This Encounter  Procedures   Flu Vaccine QUAD 640moM (Fluarix, Fluzone & Alfiuria Quad PF)     Requested Prescriptions    No prescriptions requested or ordered in this encounter    Return in about 4 months (around 02/19/2023) [facility].  DeKarle PlumberMD, FACP

## 2022-10-24 ENCOUNTER — Ambulatory Visit: Payer: 59

## 2022-10-25 ENCOUNTER — Ambulatory Visit: Payer: 59 | Admitting: Internal Medicine

## 2022-10-25 ENCOUNTER — Ambulatory Visit: Payer: 59

## 2022-11-06 ENCOUNTER — Other Ambulatory Visit: Payer: Self-pay | Admitting: Internal Medicine

## 2022-11-06 DIAGNOSIS — E038 Other specified hypothyroidism: Secondary | ICD-10-CM

## 2022-11-07 NOTE — Telephone Encounter (Signed)
Requested medication (s) are due for refill today: Yes  Requested medication (s) are on the active medication list: Yes  Last refill:  04/09/21  Future visit scheduled:   Notes to clinic:  Historical provider.    Requested Prescriptions  Pending Prescriptions Disp Refills   levothyroxine (SYNTHROID) 75 MCG tablet [Pharmacy Med Name: LEVOTHYROXINE 75 MCG TABLET] 90 tablet 4    Sig: TAKE 1 TABLET IN THE MORNING ON AN EMPTY STOMACH     Endocrinology:  Hypothyroid Agents Passed - 11/06/2022  8:11 AM      Passed - TSH in normal range and within 360 days    TSH  Date Value Ref Range Status  08/19/2022 1.340 0.450 - 4.500 uIU/mL Final         Passed - Valid encounter within last 12 months    Recent Outpatient Visits           2 weeks ago Annual physical exam   [place] Karle Plumber B, MD   1 month ago Acute bronchitis due to other specified organisms   [place], MD   2 months ago Type 2 diabetes mellitus with morbid obesity [facility])   [place] Karle Plumber B, MD   7 months ago Type 2 diabetes mellitus with morbid obesity [facility])   [place] Karle Plumber B, MD   11 months ago Type 2 diabetes mellitus with morbid obesity [facility])   [place], MD       Future Appointments             In 3 months Wynetta Emery, Dalbert Batman, MD [place]

## 2022-12-06 ENCOUNTER — Other Ambulatory Visit: Payer: Self-pay | Admitting: Internal Medicine

## 2022-12-06 DIAGNOSIS — N3946 Mixed incontinence: Secondary | ICD-10-CM

## 2022-12-23 ENCOUNTER — Other Ambulatory Visit (HOSPITAL_BASED_OUTPATIENT_CLINIC_OR_DEPARTMENT_OTHER): Payer: Self-pay | Admitting: Cardiovascular Disease

## 2023-02-21 ENCOUNTER — Ambulatory Visit: Payer: Self-pay | Admitting: Internal Medicine

## 2023-03-11 ENCOUNTER — Other Ambulatory Visit: Payer: Self-pay | Admitting: Internal Medicine

## 2023-03-14 NOTE — Telephone Encounter (Signed)
Requested medications are due for refill today.  unsure  Requested medications are on the active medications list.  no  Last refill. never  Future visit scheduled.   no  Notes to clinic.  31X 8 not on med list. A different size is, but last refill was 2019.    Requested Prescriptions  Pending Prescriptions Disp Refills   B-D ULTRAFINE III SHORT PEN 31G X 8 MM MISC [Pharmacy Med Name: BD UF SHORT PEN NEEDLE 8MMX31G] 300 each 4    Sig: AS DIRECTED SUBCUTANEOUS THREE TIMES A DAY 90     Endocrinology: Diabetes - Testing Supplies Passed - 03/11/2023  3:51 PM      Passed - Valid encounter within last 12 months    Recent Outpatient Visits           4 months ago Annual physical exam   [place] [facility] [facility] Jonah Blue B, MD   5 months ago Acute bronchitis due to other specified organisms   [place] [facility] Storm Frisk, MD   6 months ago Type 2 diabetes mellitus with morbid obesity [facility])   [place] [facility] Jonah Blue B, MD   11 months ago Type 2 diabetes mellitus with morbid obesity [facility])   [place] [facility] Jonah Blue B, MD   1 year ago Type 2 diabetes mellitus with morbid obesity [facility])    [facility] Marcine Matar, MD       Future Appointments             In 5 months Salomon Fick, Bettey Mare, MD [facility] [place], [facility]

## 2023-06-26 ENCOUNTER — Other Ambulatory Visit: Payer: Self-pay

## 2023-06-26 ENCOUNTER — Emergency Department (HOSPITAL_BASED_OUTPATIENT_CLINIC_OR_DEPARTMENT_OTHER): Payer: 59 | Admitting: Radiology

## 2023-06-26 ENCOUNTER — Encounter (HOSPITAL_BASED_OUTPATIENT_CLINIC_OR_DEPARTMENT_OTHER): Payer: Self-pay | Admitting: Emergency Medicine

## 2023-06-26 ENCOUNTER — Emergency Department (HOSPITAL_BASED_OUTPATIENT_CLINIC_OR_DEPARTMENT_OTHER)
Admission: EM | Admit: 2023-06-26 | Discharge: 2023-06-26 | Disposition: A | Discharge status: Home / Self Care | Payer: 59

## 2023-06-26 DIAGNOSIS — J189 Pneumonia, unspecified organism: Secondary | ICD-10-CM | POA: Insufficient documentation

## 2023-06-26 DIAGNOSIS — R5381 Other malaise: Secondary | ICD-10-CM | POA: Insufficient documentation

## 2023-06-26 DIAGNOSIS — R0602 Shortness of breath: Secondary | ICD-10-CM | POA: Diagnosis present

## 2023-06-26 HISTORY — DX: Disorder of thyroid, unspecified: E07.9

## 2023-06-26 LAB — CBC WITH DIFFERENTIAL/PLATELET
Abs Immature Granulocytes: 0.04 10*3/uL (ref 0.00–0.07)
Basophils Absolute: 0.1 10*3/uL (ref 0.0–0.1)
Basophils Relative: 1 %
Eosinophils Absolute: 0.2 10*3/uL (ref 0.0–0.5)
Eosinophils Relative: 3 %
HCT: 34.6 % — ABNORMAL LOW (ref 36.0–46.0)
Hemoglobin: 11.5 g/dL — ABNORMAL LOW (ref 12.0–15.0)
Immature Granulocytes: 1 %
Lymphocytes Relative: 27 %
Lymphs Abs: 1.9 10*3/uL (ref 0.7–4.0)
MCH: 29.3 pg (ref 26.0–34.0)
MCHC: 33.2 g/dL (ref 30.0–36.0)
MCV: 88.3 fL (ref 80.0–100.0)
Monocytes Absolute: 0.6 10*3/uL (ref 0.1–1.0)
Monocytes Relative: 8 %
Neutro Abs: 4.5 10*3/uL (ref 1.7–7.7)
Neutrophils Relative %: 60 %
Platelets: 212 10*3/uL (ref 150–400)
RBC: 3.92 MIL/uL (ref 3.87–5.11)
RDW: 15.3 % (ref 11.5–15.5)
WBC: 7.3 10*3/uL (ref 4.0–10.5)
nRBC: 0 % (ref 0.0–0.2)

## 2023-06-26 LAB — COMPREHENSIVE METABOLIC PANEL
ALT: 46 U/L — ABNORMAL HIGH (ref 0–44)
AST: 32 U/L (ref 15–41)
Albumin: 4 g/dL (ref 3.5–5.0)
Alkaline Phosphatase: 63 U/L (ref 38–126)
Anion gap: 8 (ref 5–15)
BUN: 19 mg/dL (ref 8–23)
CO2: 26 mmol/L (ref 22–32)
Calcium: 8.9 mg/dL (ref 8.9–10.3)
Chloride: 104 mmol/L (ref 98–111)
Creatinine, Ser: 1.04 mg/dL — ABNORMAL HIGH (ref 0.44–1.00)
GFR, Estimated: >60 mL/min (ref >60)
Glucose, Bld: 203 mg/dL — ABNORMAL HIGH (ref 70–99)
Potassium: 5 mmol/L (ref 3.5–5.1)
Sodium: 138 mmol/L (ref 135–145)
Total Bilirubin: 0.4 mg/dL (ref 0.3–1.2)
Total Protein: 7.2 g/dL (ref 6.5–8.1)

## 2023-06-26 MED ORDER — ALBUTEROL SULFATE (2.5 MG/3ML) 0.083% IN NEBU
INHALATION_SOLUTION | RESPIRATORY_TRACT | Status: AC
  Filled 2023-06-26: qty 3

## 2023-06-26 MED ORDER — ALBUTEROL SULFATE HFA 108 (90 BASE) MCG/ACT IN AERS
1.0000 | INHALATION_SPRAY | Freq: Four times a day (QID) | RESPIRATORY_TRACT | 0 refills | Status: DC | PRN
Start: 2023-06-26 — End: 2024-02-25

## 2023-06-26 MED ORDER — ALBUTEROL SULFATE HFA 108 (90 BASE) MCG/ACT IN AERS
2.0000 | INHALATION_SPRAY | Freq: Once | RESPIRATORY_TRACT | Status: DC
  Filled 2023-06-26: qty 6.7

## 2023-06-26 MED ORDER — ALBUTEROL SULFATE (2.5 MG/3ML) 0.083% IN NEBU
2.5000 mg | INHALATION_SOLUTION | Freq: Once | RESPIRATORY_TRACT | Status: AC
  Administered 2023-06-26: 2.5 mg via RESPIRATORY_TRACT

## 2023-06-26 NOTE — Discharge Instructions (Signed)
Please follow-up with your primary doctor.  Return immediately develop fevers, chills, chest pain, worsening shortness of breath, lightheadedness, dizziness, passout or he develop any new or worsening symptoms that are concerning to you.  Please take the inhaler as needed for your shortness of breath

## 2023-06-26 NOTE — ED Provider Notes (Signed)
[place] EMERGENCY DEPARTMENT AT [facility] Provider Note   CSN: [id number] Arrival date & time: 06/26/23  1210     History  Chief Complaint  Patient presents with   Pneumonia    Jessica Pace is a 62 y.o. female.  62 year old female presenting emergency department for the evaluation of shortness of breath.  Reports that her symptoms started Wednesday with generalized malaise, rhinorrhea congestion.  Awoke Thursday with fever and had some shortness of breath at that time.  Symptoms persisted and went to urgent care Friday tested negative for flu, but they did chest x-ray which showed pneumonia.  Was started on antibiotics azithromycin and doxycycline.  She was told by urgent care to follow-up, but was unable to get into her primary doctor so she came to the emergency department.  She reports that she is still having some shortness of breath.  No chest pain.  No nausea vomiting, has had some diarrhea.  No abdominal pain.  Low risk for PE based on Wells criteria.   Pneumonia       Home Medications Prior to Admission medications   Medication Sig Start Date End Date Taking? Authorizing Provider  albuterol (VENTOLIN HFA) 108 (90 Base) MCG/ACT inhaler Inhale 1-2 puffs into the lungs every 6 (six) hours as needed for wheezing or shortness of breath. 06/26/23 07/26/23 Yes Coral Spikes, DO      Allergies    Patient has no known allergies.    Review of Systems   Review of Systems  Physical Exam Updated Vital Signs BP (!) 113/54   Pulse 66   Temp 98.2 F (36.8 C) (Temporal)   Resp 20   Ht 5\' 4"  (1.626 m)   Wt 95.3 kg   SpO2 100%   BMI 36.05 kg/m  Physical Exam Vitals and nursing note reviewed.  Constitutional:      Appearance: She is obese.  HENT:     Nose: Nose normal.     Mouth/Throat:     Mouth: Mucous membranes are moist.  Eyes:     Conjunctiva/sclera: Conjunctivae normal.  Cardiovascular:     Rate and Rhythm: Normal rate and regular rhythm.   Pulmonary:     Effort: Pulmonary effort is normal.     Breath sounds: No wheezing, rhonchi or rales.  Abdominal:     General: Abdomen is flat. There is no distension.     Tenderness: There is no abdominal tenderness. There is no guarding or rebound.  Musculoskeletal:     Right lower leg: No edema.     Left lower leg: No edema.  Skin:    General: Skin is warm.     Capillary Refill: Capillary refill takes less than 2 seconds.  Neurological:     Mental Status: She is alert and oriented to person, place, and time.  Psychiatric:        Mood and Affect: Mood normal.        Behavior: Behavior normal.     ED Results / Procedures / Treatments   Labs (all labs ordered are listed, but only abnormal results are displayed) Labs Reviewed  CBC WITH DIFFERENTIAL/PLATELET - Abnormal; Notable for the following components:      Result Value   Hemoglobin 11.5 (*)    HCT 34.6 (*)    All other components within normal limits  COMPREHENSIVE METABOLIC PANEL - Abnormal; Notable for the following components:   Glucose, Bld 203 (*)    Creatinine, Ser 1.04 (*)    ALT  46 (*)    All other components within normal limits    EKG None  Radiology DG Chest 2 View  Result Date: 06/26/2023 CLINICAL DATA:  pneumonia, SOB EXAM: CHEST - 2 VIEW COMPARISON:  10/06/2022 FINDINGS: Lungs are clear.  Low lung volumes. Heart size and mediastinal contours are within normal limits. No effusion. Visualized bones unremarkable. IMPRESSION: No acute cardiopulmonary disease. Electronically Signed   By: Corlis Leak M.D.   On: 06/26/2023 14:16    Procedures Procedures    Medications Ordered in ED Medications  albuterol (PROVENTIL) (2.5 MG/3ML) 0.083% nebulizer solution (  Not Given 06/26/23 1536)  albuterol (VENTOLIN HFA) 108 (90 Base) MCG/ACT inhaler 2 puff (2 puffs Inhalation Not Given 06/26/23 1554)  albuterol (PROVENTIL) (2.5 MG/3ML) 0.083% nebulizer solution 2.5 mg (2.5 mg Nebulization Given 06/26/23 1525)    ED  Course/ Medical Decision Making/ A&P Clinical Course as of 06/26/23 1614  Mon Jun 26, 2023  1534 ECG as interpreted by me.  Normal sinus rhythm at a rate of 66 bpm.  Normal intervals.  No block.  QTc 452.  No ST segment changes indicate ischemia. [TY]    Clinical Course User Index [TY] Coral Spikes, DO                                 Medical Decision Making This is a 62 year old female with past medical history significant for prior occupational asthma who presented emergency department for reevaluation after being diagnosed with pneumonia on Friday.  She is afebrile here, not tachycardic she is maintaining her oxygen saturation on room air.  She does not appear to be in respiratory distress, lungs are clear on physical exam.  Respiratory therapist noted some mild increased work of breathing with ambulation to the room.  She is not having any chest pain and symptoms been ongoing for the past 5 days and are consistent with viral syndrome/pneumonia; cardiac etiology less likely.  Low risk for PE based on Wells criteria.  Per chart review, unable to see prior x-rays, but did not did show me paperwork from urgent care with radiology read showing bilateral groundglass opacities.  Repeat chest x-ray today without obvious pneumonia.  She has no leukocytosis on her CBC.  Her comprehensive panel with hyperglycemia, but does not appear to be in DKA.  Gave breathing treatment with significant improvement in her subjective dyspnea.  Given her history of prior occupational asthma will discharge with rescue inhaler.  Encouraged follow-up with primary doctor.  Return precautions given.  Amount and/or Complexity of Data Reviewed Labs: ordered. Radiology: ordered. ECG/medicine tests: ordered.  Risk Prescription drug management.         Final Clinical Impression(s) / ED Diagnoses Final diagnoses:  Pneumonia due to infectious organism, unspecified laterality, unspecified part of lung    Rx / DC  Orders ED Discharge Orders          Ordered    albuterol (VENTOLIN HFA) 108 (90 Base) MCG/ACT inhaler  Every 6 hours PRN        06/26/23 1613              Coral Spikes, DO 06/26/23 1614

## 2023-06-26 NOTE — ED Triage Notes (Signed)
Pt arrives to ED with c/o pneumonia. Pt notes progressive SOB x5 days. Was seen at [facility] on 9/6 dx with pneumonia per chest x-ray and started on azithromycin and doxycycline.

## 2023-06-27 ENCOUNTER — Encounter: Payer: Self-pay | Admitting: Internal Medicine

## 2023-08-16 ENCOUNTER — Ambulatory Visit: Payer: 59 | Admitting: Family Medicine

## 2023-08-16 ENCOUNTER — Encounter: Payer: Self-pay | Admitting: Family Medicine

## 2023-08-16 ENCOUNTER — Ambulatory Visit (INDEPENDENT_AMBULATORY_CARE_PROVIDER_SITE_OTHER): Payer: 59

## 2023-08-16 VITALS — BP 124/68 | HR 85 | Temp 98.9°F | Ht 64.0 in | Wt 253.0 lb

## 2023-08-16 DIAGNOSIS — R0602 Shortness of breath: Secondary | ICD-10-CM

## 2023-08-16 DIAGNOSIS — E039 Hypothyroidism, unspecified: Secondary | ICD-10-CM

## 2023-08-16 DIAGNOSIS — R635 Abnormal weight gain: Secondary | ICD-10-CM

## 2023-08-16 DIAGNOSIS — E1169 Type 2 diabetes mellitus with other specified complication: Secondary | ICD-10-CM | POA: Diagnosis not present

## 2023-08-16 DIAGNOSIS — R600 Localized edema: Secondary | ICD-10-CM | POA: Diagnosis not present

## 2023-08-16 DIAGNOSIS — I10 Essential (primary) hypertension: Secondary | ICD-10-CM | POA: Diagnosis not present

## 2023-08-16 DIAGNOSIS — R911 Solitary pulmonary nodule: Secondary | ICD-10-CM

## 2023-08-16 DIAGNOSIS — Z7689 Persons encountering health services in other specified circumstances: Secondary | ICD-10-CM

## 2023-08-16 NOTE — Patient Instructions (Signed)
I placed an order for a chest x-ray as the last one in the chart was from September 9.

## 2023-08-16 NOTE — Progress Notes (Signed)
Patient Office Visit   Subjective  Patient ID: Jessica Pace, female    DOB: 1960-11-29  Age: 62 y.o. MRN: [id number]  Chief Complaint  Patient presents with   New Patient (Initial Visit)    Pt is a 62 yo female seen to est care and for f/u on chronic conditions.  Pt previously seen by Jonah Blue, MD.  [place] gain: Noted since 2016.  Drinking 8 or more 16.9 oz bottles of water per day.  LE edema: Seen by cards, Dr. Duke Salvia.  Left leg greater than right.  HTN: Taking olmesartan-hydrochlorothiazide 40-25 mg daily.  Previously on Norvasc however stopped 2/2 causing cough.  DM2: Patient states blood sugar was controlled prior to having pneumonia a few months ago.  During illness increased to 400s.  Elevates midday and is around 150 at night.  Taking Norvasc 70/3064 units twice daily and Ozempic 1 mg weekly.  Previous seen by [facility].  Left knee pain: Seen by Ortho.  Status post arthroscopic surgery for torn meniscus.  Pulmonary nodule: Noted in 2020 after COVID-19 infection.  Low back pain: Patient Dors is bilateral low back pain.  Increases with standing or getting up from seated position.  Has become worse in the last month.  Noted as an achy sensation.  Taking Tylenol and Motrin.  Hypothyroidism: Taking Synthroid 75 mcg daily.  Allergies:  NKDA Butyl acetate-facial edema.  Reaction noted during a polymer chemistry lab.  Social history: Patient is single.  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  Patient currently  works as a Armed forces operational officer.  She has 2 children, 1 of which, Shanda Bumps is a pt of this provider.  Patient denies alcohol, tobacco, drug use.  Health maintenance: Last CPE October 21, 2022 Last Pap 2022.  Hysterectomy 1998. Last mammogram 2023 Last colonoscopy 2018.  History of diverticulitis. Immunizations:flu shot 07/2023 at work   Patient Active Problem List   Diagnosis Date Noted   Acute bronchitis due to other  specified organisms 10/06/2022   Reactive airways dysfunction syndrome (HCC) 10/06/2022   Aortic atherosclerosis (HCC) 03/31/2022   Mixed incontinence 11/29/2021   History of cancer of uterine body 06/14/2021   Hypothyroidism 06/14/2021   Pulmonary nodule 06/14/2021   Elevated LFTs 09/13/2016   Diverticulitis 09/04/2016   Type 2 diabetes mellitus with morbid obesity (HCC) 11/02/2015   History of hypothyroidism 09/01/2015   Obesity (BMI 30-39.9) 08/10/2015   Diabetes type 2, uncontrolled 08/10/2015   Essential hypertension 08/10/2015   History of vitamin D deficiency 08/10/2015   History of headache 08/10/2015   Mixed hyperlipidemia 02/28/2012   Vitamin D deficiency 02/28/2012   Past Medical History:  Diagnosis Date   Complication of anesthesia    "takes me a long time to come out" (09/05/2016)   Diabetes mellitus without complication (HCC)    Hypertension    Pneumonia 1990's X 2; 2013   Thyroid disease    Type II diabetes mellitus (HCC)    Past Surgical History:  Procedure Laterality Date   ABDOMINAL HYSTERECTOMY  1998   CARPAL TUNNEL RELEASE Right ~ 2012   TUBAL LIGATION  1994   Social History   Tobacco Use   Smoking status: Never   Smokeless tobacco: Never  Vaping Use   Vaping status: Never Used  Substance Use Topics   Alcohol use: No   Drug use: No   Family History  Problem Relation Age of Onset   Diabetes Mother    Hyperlipidemia  Father    Hypertension Father    Heart failure Maternal Grandmother    Colon cancer Neg Hx    Stomach cancer Neg Hx    Rectal cancer Neg Hx    Esophageal cancer Neg Hx    Liver cancer Neg Hx    Breast cancer Neg Hx    Allergies  Allergen Reactions   Canagliflozin     Other reaction(s): yeast   Dulaglutide Other (See Comments)    Patient reports it caused flare of diverticulitis.   Insulin Degludec Other (See Comments)   Metformin Hcl Diarrhea and Other (See Comments)   Butyl Acetate (N-Butyl Acetate) Other (See Comments)     In paints and nail polish- unknown      ROS Negative unless stated above    Objective:     BP 124/68 (BP Location: Left Arm, Patient Position: Sitting, Cuff Size: Large)   Pulse 85   Temp 98.9 F (37.2 C) (Oral)   Ht 5\' 4"  (1.626 m)   Wt 253 lb (114.8 kg)   BMI 43.43 kg/m  BP Readings from Last 3 Encounters:  08/16/23 124/68  06/26/23 (!) 148/71  10/21/22 96/64   Wt Readings from Last 3 Encounters:  08/16/23 253 lb (114.8 kg)  06/26/23 210 lb (95.3 kg)  10/21/22 236 lb (107 kg)      Physical Exam Constitutional:      General: She is not in acute distress.    Appearance: Normal appearance.  HENT:     Head: Normocephalic and atraumatic.     Nose: Nose normal.     Mouth/Throat:     Mouth: Mucous membranes are moist.  Eyes:     Conjunctiva/sclera: Conjunctivae normal.     Pupils: Pupils are equal, round, and reactive to light.  Cardiovascular:     Rate and Rhythm: Normal rate and regular rhythm.     Heart sounds: Normal heart sounds. No murmur heard.    No gallop.  Pulmonary:     Effort: Pulmonary effort is normal. No respiratory distress.     Breath sounds: Normal breath sounds. No wheezing, rhonchi or rales.  Skin:    General: Skin is warm and dry.  Neurological:     Mental Status: She is alert and oriented to person, place, and time.      No results found for any visits on 08/16/23.    Assessment & Plan:  Type 2 diabetes mellitus with other specified complication, without long-term current use of insulin (HCC)  Essential hypertension  Weight gain -236 lbs 10/21/2022.  Weight this visit 253 lbs despite use of Ozempic. -discussed possible causes including fluid retention, CHF, insulin use, diet, thyroid, etc. -pt to cut down on water intake  -Given recent pneumonia discussed obtaining CXR -Lifestyle modifications encouraged. -     DG Chest 2 View  Acquired hypothyroidism -continue synthroid 75 mcg -Recheck thyroid function if not recently  done  Bilateral leg edema -supportive care including elevation, decreasing sodium intake, compression -Patient also encouraged to decrease water intake as may be contributing to symptoms.  Increasing protein also encouraged. -obtain labs for continued symptoms. -CXR to evaluate for pulm edema given recent pna -     DG Chest 2 View  SOB (shortness of breath) -     DG Chest 2 View  Pulmonary nodules -CT chest 03/15/2022 with pulmonary nodules up to 6 mm in right upper lobe and right lower lobes.  Noncontrast CT at 3-6 months is recommended, then repeat CT  at 18-24 months from date of initial scan. -continue f/u with Pulm, Dr. Melody Comas.  Morbid obesity (HCC) -Body mass index is 43.43 kg/m. -Discussed the importance of lifestyle modifications -Consider food diary -Consider weight management referral  Encounter to establish care -We reviewed the PMH, PSH, FH, SH, Meds and Allergies. -We provided refills for any medications we will prescribe as needed. -We addressed current concerns per orders and patient instructions. -We have asked for records for pertinent exams, studies, vaccines and notes from previous providers. -We have advised patient to follow up per instructions below.   Return if symptoms worsen or fail to improve, for physical.   Deeann Saint, MD

## 2023-08-20 ENCOUNTER — Encounter: Payer: Self-pay | Admitting: Family Medicine

## 2023-08-24 ENCOUNTER — Encounter: Payer: Self-pay | Admitting: Family Medicine

## 2023-08-24 ENCOUNTER — Ambulatory Visit (INDEPENDENT_AMBULATORY_CARE_PROVIDER_SITE_OTHER): Payer: 59 | Admitting: Family Medicine

## 2023-08-24 VITALS — BP 140/78 | HR 74 | Temp 98.9°F | Ht 64.0 in | Wt 254.2 lb

## 2023-08-24 DIAGNOSIS — L270 Generalized skin eruption due to drugs and medicaments taken internally: Secondary | ICD-10-CM

## 2023-08-24 DIAGNOSIS — L309 Dermatitis, unspecified: Secondary | ICD-10-CM

## 2023-08-24 MED ORDER — TRIAMCINOLONE ACETONIDE 0.025 % EX OINT: 1.0000 | TOPICAL_OINTMENT | Freq: Two times a day (BID) | CUTANEOUS | 0 refills | Status: DC

## 2023-08-24 NOTE — Progress Notes (Signed)
Established Patient Office Visit   Subjective  Patient ID: Jessica Pace, female    DOB: Aug 01, 1961  Age: 62 y.o. MRN: [id number]  Chief Complaint  Patient presents with   Rash    Patient came in today for a rash on the inside of her hands,started a day ago, dark spots,  patient denies any pain,     Patient is a 62 year old female seen for acute concern.  Patient endorses brownish spots on palms of hands x 1 day.  Patient denies pain, pruritus, changes in soaps, lotions, detergents, new foods.  Patient had a flu shot 1 week ago and left arm.  Rash   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/2022   Aortic atherosclerosis (HCC) 03/31/2022   Mixed incontinence 11/29/2021   History of cancer of uterine body 06/14/2021   Hypothyroidism 06/14/2021   Pulmonary nodule 06/14/2021   Elevated LFTs 09/13/2016   Diverticulitis 09/04/2016   Type 2 diabetes mellitus with morbid obesity (HCC) 11/02/2015   History of hypothyroidism 09/01/2015   Obesity (BMI 30-39.9) 08/10/2015   Diabetes type 2, uncontrolled 08/10/2015   Essential hypertension 08/10/2015   History of vitamin D deficiency 08/10/2015   History of headache 08/10/2015   Mixed hyperlipidemia 02/28/2012   Vitamin D deficiency 02/28/2012   Past Medical History:  Diagnosis Date   Complication of anesthesia    "takes me a long time to come out" (09/05/2016)   Diabetes mellitus without complication (HCC)    Hypertension    Pneumonia 1990's X 2; 2013   Thyroid disease    Type II diabetes mellitus (HCC)    Past Surgical History:  Procedure Laterality Date   ABDOMINAL HYSTERECTOMY  1998   CARPAL TUNNEL RELEASE Right ~ 2012   TUBAL LIGATION  1994   Social History   Tobacco Use   Smoking status: Never   Smokeless tobacco: Never  Vaping Use   Vaping status: Never Used  Substance Use Topics    Alcohol use: No   Drug use: No   Family History  Problem Relation Age of Onset   Diabetes Mother    Hyperlipidemia Father    Hypertension Father    Heart failure Maternal Grandmother    Colon cancer Neg Hx    Stomach cancer Neg Hx    Rectal cancer Neg Hx    Esophageal cancer Neg Hx    Liver cancer Neg Hx    Breast cancer Neg Hx    Allergies  Allergen Reactions   Canagliflozin     Other reaction(s): yeast   Dulaglutide Other (See Comments)    Patient reports it caused flare of diverticulitis.   Insulin Degludec Other (See Comments)   Metformin Hcl Diarrhea and Other (See Comments)   Butyl Acetate (N-Butyl Acetate) Other (See Comments)    In paints and nail polish- unknown      Review of Systems  Skin:  Positive for rash.   Negative unless stated above    Objective:     BP (!) 140/78 (BP Location: Right Arm, Patient Position: Sitting, Cuff Size: Large)   Pulse 74   Temp 98.9 F (37.2 C) (Oral)   Ht 5\' 4"  (1.626 m)   Wt 254 lb 3.2 oz (115.3 kg)   BMI 43.63 kg/m  BP Readings from Last 3 Encounters:  08/24/23 (!) 140/78  08/16/23 124/68  06/26/23 (!) 148/71  Wt Readings from Last 3 Encounters:  08/24/23 254 lb 3.2 oz (115.3 kg)  08/16/23 253 lb (114.8 kg)  06/26/23 210 lb (95.3 kg)      Physical Exam Constitutional:      Appearance: Normal appearance. She is obese.  HENT:     Head: Normocephalic and atraumatic.     Mouth/Throat:     Mouth: Mucous membranes are moist.  Eyes:     Extraocular Movements: Extraocular movements intact.     Conjunctiva/sclera: Conjunctivae normal.  Cardiovascular:     Rate and Rhythm: Normal rate and regular rhythm.  Pulmonary:     Effort: Pulmonary effort is normal.  Skin:    General: Skin is warm and dry.     Comments: Hypopigmented, flat lesions in palms of hands  Neurological:     Mental Status: She is alert.          No results found for any visits on 08/24/23.    Assessment & Plan:  Dermatitis -      Triamcinolone Acetonide; Apply 1 Application topically 2 (two) times daily. Not for use on face.  Dispense: 30 g; Refill: 0  New onset rash on palms of hands bilaterally with rough appearing skin on dorsum hands.  Concerning symptoms due to drug eruption from influenza vaccine 1 week ago.  Also consider vasculitis, atopic dermatitis.  Though less likely also consider viral infection such as coxsackie A, Rickettsia, syphilis.  Start triamcinolone cream.  For continued or worsening symptoms refer to dermatology.  Return if symptoms worsen or fail to improve.   Deeann Saint, MD

## 2023-08-25 ENCOUNTER — Encounter: Payer: Self-pay | Admitting: Family Medicine

## 2023-09-01 ENCOUNTER — Telehealth: Payer: Self-pay | Admitting: Cardiovascular Disease

## 2023-09-01 NOTE — Telephone Encounter (Signed)
Pt c/o Shortness Of Breath: STAT if SOB developed within the last 24 hours or pt is noticeably SOB on the phone  1. Are you currently SOB (can you hear that pt is SOB on the phone)? Sob   2. How long have you been experiencing SOB? Over a month but is getting worse  3. Are you SOB when sitting or when up moving around? Moving around  4. Are you currently experiencing any other symptoms? Retaining fluid in ankles

## 2023-09-01 NOTE — Telephone Encounter (Signed)
Spoke with patient and scheduled appointment for Monday

## 2023-09-03 NOTE — Progress Notes (Unsigned)
[facility] Assessment:    Date:  09/04/2023   ID:  Jessica Pace, DOB Jan 04, 1961, MRN [id number]  PCP:  Deeann Saint, MD  Cardiologist:  None  Nephrologist:  Referring MD: Deeann Saint, MD   CC: Hypertension  History of Present Illness:    Jessica Pace is a 62 y.o. female with a hx of HTN, hypothyroidism, DM2, pneumonia, aortic atherosclerosis.   Established with [facility] 12/16/21. Initial diagnosis of HTN in 2013 with more difficult to control readings starting 6 mos prior to her [facility] appointment. Olmesartan and hydrochlorothiazide were increased. Amlodipine later added by [facility]. Referred to PREP exercise program but had to stop after her ex-husband died.   Last seen 2022/06/02 recovering from COVID that did not require hospitalization. LE edema had resolved. Had been started on Ozempic the week prior.   Contacted the office noting exertional dyspnea, edema and OV scheduled. Prior echo 12/2021 normal LVEF.   Discussed the use of AI scribe software for clinical note transcription with the patient, who gave verbal consent to proceed.  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 She notes dyspnea on exertion progressively worsening since August. Had "double pneumonia" in September which made breathing worse, has improved somewhat since then but still dyspnea with basic household chores. She will have some chest pain across her chest after prolonged dyspnea.  The patient also reports lower back pain and well as numbness in her left hand - encouraged to discuss with PCP. ,  The patient also reports swelling in the legs and face, which has been constant throughout the day. Endorses abdominal fullness. The patient has been wearing compression socks to manage the swelling. On exam, these are ankle height compression socks. The patient has also noticed a significant weight gain of twenty pounds since September  per her report. The patient has been experiencing congestion with productive cough of white phlegm but notes she does not have a cold. The patient has been waking up multiple times at night due to the need to urinate. Drinking six or seven 16 oz bottles of water per day, nocturia has improved some since she reduced water intake.       Past Medical History:  Diagnosis Date   Complication of anesthesia    "takes me a long time to come out" (09/05/2016)   Diabetes mellitus without complication (HCC)    Hypertension    Pneumonia 1990's X 2; 2013   Thyroid disease    Type II diabetes mellitus (HCC)     Past Surgical History:  Procedure Laterality Date   ABDOMINAL HYSTERECTOMY  1998   CARPAL TUNNEL RELEASE Right ~ 2012   TUBAL LIGATION  1994    Current Medications: Current Meds  Medication Sig   albuterol (VENTOLIN HFA) 108 (90 Base) MCG/ACT inhaler Inhale 2 puffs into the lungs every 6 (six) hours as needed for wheezing or shortness of breath.   Ascorbic Acid (VITAMIN C) 1000 MG tablet Take 1,000 mg by mouth daily.   atorvastatin (LIPITOR) 10 MG tablet Take 1 tablet (10 mg total) by mouth daily.   Blood Glucose Monitoring Suppl (ACCU-CHEK GUIDE ME) w/Device KIT See admin instructions.   cephALEXin (KEFLEX) 500 MG capsule Take 500 mg by mouth 2 (two) times daily.   Cod Liver Oil CAPS Take 1 capsule by mouth every morning.   Continuous Glucose Sensor (FREESTYLE LIBRE 3 PLUS SENSOR) MISC 1 EACH BY DOES NOT APPLY ROUTE EVERY 14 (  FOURTEEN) DAYS.   GARLIC OIL PO Take 1 tablet by mouth daily.   Glucose Blood (BLOOD GLUCOSE TEST STRIPS) STRP Test twice a day. Pt uses one touch verio meter   ibuprofen (ADVIL) 600 MG tablet Take by mouth.   insulin NPH-regular Human (NOVOLIN 70/30) (70-30) 100 UNIT/ML injection Inject into the skin.   Insulin Pen Needle (PEN NEEDLES) 32G X 4 MM MISC 1 each by Other route daily. Use with Basalgar   levothyroxine (SYNTHROID) 75 MCG tablet TAKE 1 TABLET IN THE  MORNING ON AN EMPTY STOMACH   NOVOLOG MIX 70/30 FLEXPEN (70-30) 100 UNIT/ML FlexPen 64 units with breakfast, 34 units with lunch and 64 units with dinner subcut (Patient taking differently: 64 units with breakfast, and 64 units with dinner subcut)   olmesartan-hydrochlorothiazide (BENICAR HCT) 40-25 MG tablet TAKE 1 TABLET BY MOUTH EVERY DAY   ONETOUCH DELICA LANCETS FINE MISC Test twice a daily. Pt uses a one touch verio meter   solifenacin (VESICARE) 5 MG tablet TAKE 1 TABLET (5 MG TOTAL) BY MOUTH DAILY.   triamcinolone (KENALOG) 0.025 % ointment Apply 1 Application topically 2 (two) times daily. Not for use on face.     Allergies:   Canagliflozin, Dulaglutide, Insulin degludec, Metformin hcl, and Butyl acetate (n-butyl acetate)   Social History   Socioeconomic History   Marital status: Legally Separated    Spouse name: Not on file   Number of children: 2   Years of education: Not on file   Highest education level: Not on file  Occupational History   Occupation: Paramedic  Tobacco Use   Smoking status: Never   Smokeless tobacco: Never  Vaping Use   Vaping status: Never Used  Substance and Sexual Activity   Alcohol use: No   Drug use: No   Sexual activity: Not Currently    Birth control/protection: Surgical  Other Topics Concern   Not on file  Social History Narrative   ** Merged History Encounter **       Social Determinants of Health   Financial Resource Strain: Low Risk  (12/16/2021)   Overall Financial Resource Strain (CARDIA)    Difficulty of Paying Living Expenses: Not hard at all  Food Insecurity: No Food Insecurity (12/16/2021)   Hunger Vital Sign    Worried About Running Out of Food in the Last Year: Never true    Ran Out of Food in the Last Year: Never true  Transportation Needs: No Transportation Needs (12/16/2021)   PRAPARE - Administrator, Civil Service (Medical): No    Lack of Transportation (Non-Medical): No  Physical Activity:  Insufficiently Active (12/16/2021)   Exercise Vital Sign    Days of Exercise per Week: 5 days    Minutes of Exercise per Session: 20 min  Stress: Not on file  Social Connections: Unknown (05/16/2022)   Received from [facility], [facility]   Social Network    Social Network: Not on file     Family History: The patient's family history includes Diabetes in her mother; Heart failure in her maternal grandmother; Hyperlipidemia in her father; Hypertension in her father. There is no history of Colon cancer, Stomach cancer, Rectal cancer, Esophageal cancer, Liver cancer, or Breast cancer.  ROS:   Please see the history of present illness.     All other systems reviewed and are negative.  EKGs/Labs/Other Studies Reviewed:    EKG Interpretation Date/Time:  Monday September 04 2023 11:39:06 EST Ventricular Rate:  86 PR  Interval:  160 QRS Duration:  56 QT Interval:  364 QTC Calculation: 435 R Axis:   34  Text Interpretation: Normal sinus rhythm Low voltage QRS  Nonspecific TWI in lead V2 Confirmed by Gillian Shields ([id number]) on 09/04/2023 11:45:01 AM    Recent Labs: 06/26/2023: ALT 46; BUN 19; Creatinine, Ser 1.04; Hemoglobin 11.5; Platelets 212; Potassium 5.0; Sodium 138   Recent Lipid Panel    Component Value Date/Time   CHOL 193 03/31/2022 1625   TRIG 265 (H) 03/31/2022 1625   HDL 35 (L) 03/31/2022 1625   CHOLHDL 5.5 (H) 03/31/2022 1625   CHOLHDL 7.8 (H) 01/18/2017 1019   VLDL 72 (H) 01/18/2017 1019   LDLCALC 112 (H) 03/31/2022 1625    Physical Exam:   VS:  BP 120/68   Pulse (!) 55   Ht 5\' 4"  (1.626 m)   Wt 252 lb 8 oz (114.5 kg)   SpO2 99%   BMI 43.34 kg/m  , BMI Body mass index is 43.34 kg/m. GENERAL:  Well appearing, overweight HEENT: Pupils equal round and reactive, fundi not visualized, oral mucosa unremarkable NECK:  No jugular venous distention, waveform within normal limits, carotid upstroke brisk and symmetric, no bruits, no thyromegaly LYMPHATICS:  No  cervical adenopathy LUNGS:  Clear to auscultation bilaterally HEART:  RRR.  PMI not displaced or sustained,S1 and S2 within normal limits, no S3, no S4, no clicks, no rubs, no murmurs ABD:  Flat, positive bowel sounds normal in frequency in pitch, no bruits, no rebound, no guarding, no midline pulsatile mass, no hepatomegaly, no splenomegaly EXT:  2 plus pulses throughout, no cyanosis no clubbing.  Nonpitting bilateral pretibial edema.  SKIN:  No rashes no nodules NEURO:  Cranial nerves II through XII grossly intact, motor grossly intact throughout [facility]:  Cognitively intact, oriented to person place and time   ASSESSMENT/PLAN:        Shortness of Breath and Edema New onset since August, worsened with pneumonia in September but still persistent. Associated with weight gain and abdominal bloating. No relief with current hydrochlorothiazide. No chest pain, but reports tightness with dyspnea. Family history of congestive heart failure. Consider etiology residual dyspnea from PNA versus heart failure.  -Order echocardiogram to assess cardiac function. -Order BNP, BMP to assess fluid status and kidney function. -Start Furosemide 20mg  daily for 3 days to reduce fluid overload. -Consider referral to pulmonology and pulmonary function tests if cardiac workup is normal.  HTN  BP control optimal, continue present antihypertensive regimen.   Aortic atherosclerosis / HLD  No anginal symptoms, no indication for ischemic eval. -Continue Atorvastatin 10mg  daily        Screening for Secondary Hypertension:     12/16/2021    4:26 PM  Causes  Drugs/Herbals Screened  Renovascular HTN N/A  Sleep Apnea Screened     - Comments Snores.  No other symptoms.  Thyroid Disease Screened  Hyperaldosteronism N/A  Cushing's Syndrome N/A  Hyperparathyroidism Screened  Coarctation of the Aorta Screened     - Comments BP symmetric  Compliance Screened    Relevant Labs/Studies:    Latest Ref Rng & Units  06/26/2023   12:39 PM 04/26/2022    6:55 PM 12/27/2021    9:40 AM  Basic Labs  Sodium 135 - 145 mmol/L 138  138  137   Potassium 3.5 - 5.1 mmol/L 5.0  4.5  4.5   Creatinine 0.44 - 1.00 mg/dL 6.57  8.46  9.62        Latest  Ref Rng & Units 08/19/2022   11:43 AM 11/29/2021   11:46 AM  Thyroid   TSH 0.450 - 4.500 uIU/mL 1.340  1.320                   Disposition:    FU with MD/PharmD in 2 months    Medication Adjustments/Labs and Tests Ordered: Current medicines are reviewed at length with the patient today.  Concerns regarding medicines are outlined above.  Orders Placed This Encounter  Procedures   EKG 12-Lead   No orders of the defined types were placed in this encounter.    Signed, Jessica Sorrow, NP  09/04/2023 11:49 AM    [facility]

## 2023-09-04 ENCOUNTER — Ambulatory Visit (INDEPENDENT_AMBULATORY_CARE_PROVIDER_SITE_OTHER): Payer: 59 | Admitting: Family

## 2023-09-04 ENCOUNTER — Encounter (HOSPITAL_BASED_OUTPATIENT_CLINIC_OR_DEPARTMENT_OTHER): Payer: Self-pay | Admitting: Family

## 2023-09-04 VITALS — BP 120/68 | HR 55 | Ht 64.0 in | Wt 252.5 lb

## 2023-09-04 DIAGNOSIS — I10 Essential (primary) hypertension: Secondary | ICD-10-CM

## 2023-09-04 DIAGNOSIS — R600 Localized edema: Secondary | ICD-10-CM

## 2023-09-04 DIAGNOSIS — R0609 Other forms of dyspnea: Secondary | ICD-10-CM

## 2023-09-04 MED ORDER — FUROSEMIDE 20 MG PO TABS: ORAL_TABLET | ORAL | 0 refills | Status: DC

## 2023-09-04 NOTE — Patient Instructions (Addendum)
Medication Instructions:   START Furosemide (lasix) 20mg  daily for three days  *If you need a refill on your cardiac medications before your next appointment, please call your pharmacy*   Lab Work: Your physician recommends that you return for lab work one day this week for BMP, BNP, CBC. You do not need to fast for this blood work.   Please return for Lab work. You may come to the...   [facility] (3rd floor) [address] [place], [place] [address]  Open: 8am-Noon and 1pm-4:30pm  Please ring the doorbell on the small table when you exit the elevator and the Lab Tech will come get you  [facility] [place] [facility] [address], [place], [place] [address] Open: 8am-1pm, then 2pm-4:30pm   [facility]- Please see attached locations sheet stapled to your lab work with address and hours.    If you have labs (blood work) drawn today and your tests are completely normal, you will receive your results only by: MyChart Message (if you have MyChart) OR A paper copy in the mail If you have any lab test that is abnormal or we need to change your treatment, we will call you to review the results.   Testing/Procedures: Your physician has requested that you have an echocardiogram. Echocardiography is a painless test that uses sound waves to create images of your heart. It provides your doctor with information about the size and shape of your heart and how well your heart's chambers and valves are working. This procedure takes approximately one hour. There are no restrictions for this procedure. Please do NOT wear cologne, perfume, aftershave, or lotions (deodorant is allowed). Please arrive 15 minutes prior to your appointment time.  Please note: We ask at that you not bring children with you during ultrasound (echo/ vascular) testing. Due to room size and safety concerns, children are not allowed in the ultrasound rooms during exams. Our front office  staff cannot provide observation of children in our lobby area while testing is being conducted. An adult accompanying a patient to their appointment will only be allowed in the ultrasound room at the discretion of the ultrasound technician under special circumstances. We apologize for any inconvenience.    Follow-Up: At [facility], you and your health needs are our priority.  As part of our continuing mission to provide you with exceptional heart care, we have created designated Provider Care Teams.  These Care Teams include your primary Cardiologist (physician) and Advanced Practice Providers (APPs -  Physician Assistants and Nurse Practitioners) who all work together to provide you with the care you need, when you need it.  We recommend signing up for the patient portal called "MyChart".  Sign up information is provided on this After Visit Summary.  MyChart is used to connect with patients for Virtual Visits (Telemedicine).  Patients are able to view lab/test results, encounter notes, upcoming appointments, etc.  Non-urgent messages can be sent to your provider as well.   To learn more about what you can do with MyChart, go to [email or web address].    Your next appointment:   2 month(s) in [facility]  Other Instructions  To prevent or reduce lower extremity swelling: Eat a low salt diet. Salt makes the body hold onto extra fluid which causes swelling. Sit with legs elevated. For example, in the recliner or on an ottoman.  Wear knee-high compression stockings during the daytime. Ones labeled 15-20 mmHg provide good compression.

## 2023-09-07 LAB — BASIC METABOLIC PANEL
BUN/Creatinine Ratio: 14 (ref 12–28)
BUN: 14 mg/dL (ref 8–27)
CO2: 22 mmol/L (ref 20–29)
Calcium: 9.3 mg/dL (ref 8.7–10.3)
Chloride: 99 mmol/L (ref 96–106)
Creatinine, Ser: 1.01 mg/dL — ABNORMAL HIGH (ref 0.57–1.00)
Glucose: 240 mg/dL — ABNORMAL HIGH (ref 70–99)
Potassium: 4.1 mmol/L (ref 3.5–5.2)
Sodium: 138 mmol/L (ref 134–144)
eGFR: 62 mL/min/{1.73_m2} (ref >59)

## 2023-09-07 LAB — CBC
Hematocrit: 39.3 % (ref 34.0–46.6)
Hemoglobin: 12.5 g/dL (ref 11.1–15.9)
MCH: 28.5 pg (ref 26.6–33.0)
MCHC: 31.8 g/dL (ref 31.5–35.7)
MCV: 90 fL (ref 79–97)
Platelets: 236 10*3/uL (ref 150–450)
RBC: 4.39 x10E6/uL (ref 3.77–5.28)
RDW: 14.1 % (ref 11.7–15.4)
WBC: 5.7 10*3/uL (ref 3.4–10.8)

## 2023-09-07 LAB — BRAIN NATRIURETIC PEPTIDE: BNP: 13.7 pg/mL (ref 0.0–100.0)

## 2023-09-11 ENCOUNTER — Other Ambulatory Visit: Payer: Self-pay | Admitting: Family Medicine

## 2023-09-11 DIAGNOSIS — Z1231 Encounter for screening mammogram for malignant neoplasm of breast: Secondary | ICD-10-CM

## 2023-09-17 ENCOUNTER — Other Ambulatory Visit: Payer: Self-pay | Admitting: Internal Medicine

## 2023-09-17 DIAGNOSIS — E038 Other specified hypothyroidism: Secondary | ICD-10-CM

## 2023-09-26 ENCOUNTER — Other Ambulatory Visit (HOSPITAL_BASED_OUTPATIENT_CLINIC_OR_DEPARTMENT_OTHER): Payer: 59

## 2023-09-26 DIAGNOSIS — R0609 Other forms of dyspnea: Secondary | ICD-10-CM

## 2023-09-26 DIAGNOSIS — I10 Essential (primary) hypertension: Secondary | ICD-10-CM

## 2023-09-26 DIAGNOSIS — R600 Localized edema: Secondary | ICD-10-CM

## 2023-09-26 LAB — ECHOCARDIOGRAM COMPLETE
AV Vena cont: 0.15 cm
Area-P 1/2: 3.77 cm2
P 1/2 time: 450 ms
S' Lateral: 1.94 cm

## 2023-10-05 ENCOUNTER — Other Ambulatory Visit: Payer: Self-pay | Admitting: Family Medicine

## 2023-10-05 DIAGNOSIS — E1169 Type 2 diabetes mellitus with other specified complication: Secondary | ICD-10-CM

## 2023-10-05 NOTE — Telephone Encounter (Signed)
Copied from CRM [id number]. Topic: Clinical - Medication Refill >> Oct 05, 2023  9:13 AM Elizebeth Brooking wrote: Most Recent Primary Care Visit:  Provider: Abbe Amsterdam R  Department: [facility]  Visit Type: OFFICE VISIT  Date: 08/24/2023  Medication: NOVOLOG MIX 70/30 FLEXPEN (70-30) 100 UNIT/ML FlexPen Insulin Pen Needle (PEN NEEDLES) 32G X 4 MM MISC  Has the patient contacted their pharmacy? Yes , Pharmacy stated former Doctor is not refilling prescription, will need a new prescription from new doctor  (Agent: If no, request that the patient contact the pharmacy for the refill. If patient does not wish to contact the pharmacy document the reason why and proceed with request.) (Agent: If yes, when and what did the pharmacy advise?)  Is this the correct pharmacy for this prescription? Yes If no, delete pharmacy and type the correct one.  This is the patient's preferred pharmacy:  [id number] - [place], [place] - [address] AT [address] OF [facility] [address] [place] [address] Phone: [phone] Fax: [phone]   Has the prescription been filled recently? No  Is the patient out of the medication? Yes  Has the patient been seen for an appointment in the last year OR does the patient have an upcoming appointment?   Can we respond through MyChart?   Agent: Please be advised that Rx refills may take up to 3 business days. We ask that you follow-up with your pharmacy.

## 2023-10-09 ENCOUNTER — Ambulatory Visit
Admission: RE | Admit: 2023-10-09 | Discharge: 2023-10-09 | Disposition: A | Discharge status: Home / Self Care | Payer: 59 | Source: Ambulatory Visit

## 2023-10-09 DIAGNOSIS — Z1231 Encounter for screening mammogram for malignant neoplasm of breast: Secondary | ICD-10-CM

## 2023-10-09 MED ORDER — PEN NEEDLES 32G X 4 MM MISC: 1.0000 | Freq: Every day | 0 refills | Status: DC

## 2023-10-09 MED ORDER — NOVOLOG MIX 70/30 FLEXPEN (70-30) 100 UNIT/ML ~~LOC~~ SUPN: PEN_INJECTOR | SUBCUTANEOUS | 3 refills | Status: DC

## 2023-10-13 ENCOUNTER — Encounter: Payer: Self-pay | Admitting: Urology

## 2023-10-23 ENCOUNTER — Encounter: Payer: 59 | Admitting: Family Medicine

## 2023-11-17 ENCOUNTER — Encounter (HOSPITAL_BASED_OUTPATIENT_CLINIC_OR_DEPARTMENT_OTHER): Payer: 59 | Admitting: Cardiovascular Disease

## 2023-11-23 ENCOUNTER — Ambulatory Visit (INDEPENDENT_AMBULATORY_CARE_PROVIDER_SITE_OTHER): Payer: 59 | Admitting: Family Medicine

## 2023-11-23 ENCOUNTER — Other Ambulatory Visit: Payer: Self-pay | Admitting: Family Medicine

## 2023-11-23 ENCOUNTER — Encounter: Payer: Self-pay | Admitting: Family Medicine

## 2023-11-23 VITALS — BP 142/80 | HR 84 | Temp 98.6°F | Ht 64.0 in | Wt 255.4 lb

## 2023-11-23 DIAGNOSIS — Z0000 Encounter for general adult medical examination without abnormal findings: Secondary | ICD-10-CM

## 2023-11-23 DIAGNOSIS — Z794 Long term (current) use of insulin: Secondary | ICD-10-CM

## 2023-11-23 DIAGNOSIS — E1169 Type 2 diabetes mellitus with other specified complication: Secondary | ICD-10-CM

## 2023-11-23 DIAGNOSIS — E039 Hypothyroidism, unspecified: Secondary | ICD-10-CM

## 2023-11-23 DIAGNOSIS — L309 Dermatitis, unspecified: Secondary | ICD-10-CM

## 2023-11-23 DIAGNOSIS — I10 Essential (primary) hypertension: Secondary | ICD-10-CM

## 2023-11-23 DIAGNOSIS — N3946 Mixed incontinence: Secondary | ICD-10-CM

## 2023-11-23 DIAGNOSIS — G5602 Carpal tunnel syndrome, left upper limb: Secondary | ICD-10-CM

## 2023-11-23 DIAGNOSIS — E782 Mixed hyperlipidemia: Secondary | ICD-10-CM

## 2023-11-23 DIAGNOSIS — R202 Paresthesia of skin: Secondary | ICD-10-CM

## 2023-11-23 MED ORDER — OLMESARTAN MEDOXOMIL-HCTZ 40-25 MG PO TABS: 1.0000 | ORAL_TABLET | Freq: Every day | ORAL | 3 refills | Status: AC

## 2023-11-23 MED ORDER — INSULIN PEN NEEDLE 32G X 8 MM MISC: 1.0000 | 3 refills | Status: DC

## 2023-11-23 MED ORDER — SOLIFENACIN SUCCINATE 5 MG PO TABS: 5.0000 mg | ORAL_TABLET | Freq: Every day | ORAL | 3 refills | Status: DC

## 2023-11-23 MED ORDER — ATORVASTATIN CALCIUM 10 MG PO TABS: 10.0000 mg | ORAL_TABLET | Freq: Every day | ORAL | 3 refills | Status: DC

## 2023-11-23 MED ORDER — LEVOTHYROXINE SODIUM 75 MCG PO TABS: 75.0000 ug | ORAL_TABLET | Freq: Every day | ORAL | 3 refills | Status: DC

## 2023-11-23 NOTE — Progress Notes (Signed)
 Established Patient Office Visit   Subjective  Patient ID: Jessica Pace, female    DOB: 11-Dec-1960  Age: 63 y.o. MRN: [id number]  Chief Complaint  Patient presents with   Medical Management of Chronic Issues    Weight, and bladder control    Annual Exam    Patient is a 63 year old female seen for CPE and numerous concerns.  Patient requesting referral to hand surgeon for numbness and tingling in left first and second digits.  Patient states symptoms are similar to previous carpal tunnel symptoms of right hand status post release.  Patient previously seen by surgeon in [place] but cannot recall the name.  Patient does a lot of typing for work.  Patient with history of DM.  Requesting referral to Endo, 8 mm pen needles, and 90-day supply of NovoLog  70/30 FlexPen.  In the past patient unable to take Trulicity  as it caused diarrhea which led to diverticulitis.  Also had diarrhea with metformin .  Patient requesting referral to dermatology given continued hyperpigmented areas on face, palms of hands, and breast.  Lesions initially pruritic.  Patient notes incontinence.  Has history of hysterectomy.  Has to wear adult briefs as often unable to make it to the restroom in time.  Previously on Vesicare .  Unsure if it was helpful.  Patient working on weight.  Having difficulty losing.     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/2022   Aortic atherosclerosis (HCC) 03/31/2022   Mixed incontinence 11/29/2021   History of cancer of uterine body 06/14/2021   Hypothyroidism 06/14/2021   Pulmonary nodule 06/14/2021   Elevated LFTs 09/13/2016   Diverticulitis 09/04/2016   Type 2 diabetes mellitus with morbid obesity (HCC) 11/02/2015   History of hypothyroidism 09/01/2015   Obesity (BMI 30-39.9) 08/10/2015   Diabetes type 2, uncontrolled 08/10/2015   Essential  hypertension 08/10/2015   History of vitamin D  deficiency 08/10/2015   History of headache 08/10/2015   Mixed hyperlipidemia 02/28/2012   Vitamin D  deficiency 02/28/2012   Past Medical History:  Diagnosis Date   Allergy    Asthma    Complication of anesthesia    takes me a long time to come out (09/05/2016)   Diabetes mellitus without complication (HCC)    Hypertension    Pneumonia 1990's X 2; 2013   Thyroid disease    Type II diabetes mellitus (HCC)    Past Surgical History:  Procedure Laterality Date   ABDOMINAL HYSTERECTOMY  1998   CARPAL TUNNEL RELEASE Right ~ 2012   TUBAL LIGATION  1994   Social History   Tobacco Use   Smoking status: Never   Smokeless tobacco: Never  Vaping Use   Vaping status: Never Used  Substance Use Topics   Alcohol use: No   Drug use: No   Family History  Problem Relation Age of Onset   Diabetes Mother    Hypertension Mother    Obesity Mother    Hyperlipidemia Father    Hypertension Father    Hypertension Maternal Grandmother    Heart failure Maternal Grandmother    Colon cancer Neg Hx    Stomach cancer Neg Hx    Rectal cancer Neg Hx    Esophageal cancer Neg Hx    Liver cancer Neg Hx    Breast cancer Neg Hx    Allergies  Allergen Reactions   Canagliflozin  Other reaction(s): yeast   Dulaglutide  Other (See Comments)    Patient reports it caused flare of diverticulitis.   Insulin  Degludec Other (See Comments)   Metformin  Hcl Diarrhea and Other (See Comments)   Butyl Acetate (N-Butyl Acetate) Other (See Comments)    In paints and nail polish- unknown      ROS Negative unless stated above    Objective:     BP (!) 142/80 (BP Location: Right Arm, Patient Position: Sitting, Cuff Size: Large)   Pulse 84   Temp 98.6 F (37 C) (Oral)   Ht 5' 4 (1.626 m)   Wt 255 lb 6.4 oz (115.8 kg)   SpO2 97%   BMI 43.84 kg/m  BP Readings from Last 3 Encounters:  11/23/23 (!) 142/80  09/04/23 120/68  08/24/23 (!) 140/78   Wt  Readings from Last 3 Encounters:  11/23/23 255 lb 6.4 oz (115.8 kg)  09/04/23 252 lb 8 oz (114.5 kg)  08/24/23 254 lb 3.2 oz (115.3 kg)     Physical Exam Constitutional:      Appearance: Normal appearance. She is obese.  HENT:     Head: Normocephalic and atraumatic.     Right Ear: Tympanic membrane, ear canal and external ear normal.     Left Ear: Tympanic membrane, ear canal and external ear normal.     Nose: Nose normal.     Mouth/Throat:     Mouth: Mucous membranes are moist.     Pharynx: No oropharyngeal exudate or posterior oropharyngeal erythema.  Eyes:     General: No scleral icterus.    Extraocular Movements: Extraocular movements intact.     Conjunctiva/sclera: Conjunctivae normal.     Pupils: Pupils are equal, round, and reactive to light.  Neck:     Thyroid: No thyromegaly.  Cardiovascular:     Rate and Rhythm: Normal rate and regular rhythm.     Pulses: Normal pulses.     Heart sounds: Normal heart sounds. No murmur heard.    No friction rub.  Pulmonary:     Effort: Pulmonary effort is normal.     Breath sounds: Normal breath sounds. No wheezing, rhonchi or rales.  Abdominal:     General: Bowel sounds are normal.     Palpations: Abdomen is soft.     Tenderness: There is no abdominal tenderness.  Musculoskeletal:        General: No deformity. Normal range of motion.  Lymphadenopathy:     Cervical: No cervical adenopathy.  Skin:    General: Skin is warm and dry.     Findings: No lesion.     Comments: Faintly hyperpigmented papules of bilateral breast.  Faint indention/scratch of right cheek.  Faint hyperpigmented appearance of bilateral cheeks and chin.  Neurological:     General: No focal deficit present.     Mental Status: She is alert and oriented to person, place, and time.     Comments: Positive Tinel's and Phalen's of left wrist.  No deformity.  Psychiatric:        Mood and Affect: Mood normal.        Thought Content: Thought content normal.        11/23/2023    3:53 PM 08/16/2023    4:32 PM 10/21/2022    3:22 PM  Depression screen PHQ 2/9  Decreased Interest 0 0 0  Down, Depressed, Hopeless 0 0 0  PHQ - 2 Score 0 0 0  Altered sleeping 1 2 0  Tired, decreased energy  1 3 1   Change in appetite 0 2 1  Feeling bad or failure about yourself  0 0 0  Trouble concentrating 0 0 0  Moving slowly or fidgety/restless 0 0 0  Suicidal thoughts 0 0 0  PHQ-9 Score 2 7 2   Difficult doing work/chores Somewhat difficult Somewhat difficult       11/23/2023    3:54 PM 08/16/2023    4:32 PM 10/21/2022    3:23 PM 10/06/2022    8:55 AM  GAD 7 : Generalized Anxiety Score  Nervous, Anxious, on Edge 0 0 0 0  Control/stop worrying 0 0 0 0  Worry too much - different things 0 0 0 0  Trouble relaxing 0 2 0 0  Restless 0 0 0 0  Easily annoyed or irritable 0 0 0 0  Afraid - awful might happen 0 0 0 0  Total GAD 7 Score 0 2 0 0  Anxiety Difficulty Not difficult at all      Diabetic Foot Exam - Simple   Simple Foot Form Diabetic Foot exam was performed with the following findings: Yes 11/23/2023  4:56 PM  Visual Inspection No deformities, no ulcerations, no other skin breakdown bilaterally: Yes Sensation Testing Intact to touch and monofilament testing bilaterally: Yes Pulse Check Posterior Tibialis and Dorsalis pulse intact bilaterally: Yes Comments     No results found for any visits on 11/23/23.    Assessment & Plan:  Well adult exam -Anticipatory guidance given including wearing seatbelts, smoke detectors in the home, increasing physical activity, increasing p.o. intake of water and vegetables. -Obtain labs -Immunizations reviewed -Mammogram up-to-date done 10/09/2023 -Colonoscopy up-to-date done 11/16/2016 -Pap not indicated 2/2 history of hysterectomy.  Consider pelvic to check vaginal cuff for any changes -Bone density scan at 63. -Neck CPE in 1 year  Type 2 diabetes mellitus with other specified complication, without long-term current  use of insulin  (HCC) -Continue NovoLog  64 units with breakfast, 34 units with lunch, and 64 units with dinner. -Discussed the importance of lifestyle modifications -Continue statin and ARB -Foot exam done this visit -Patient to set up eye exam -     Hemoglobin A1c; Future -     Ambulatory referral to Endocrinology  Essential hypertension -Elevated -Recheck -Continue current medications -Monitor BP at home.  For readings consistently greater than 140/90 adjust medication -Follow-up in 4 to 6 weeks -     Comprehensive metabolic panel; Future -     Olmesartan  Medoxomil-HCTZ; Take 1 tablet by mouth daily.  Dispense: 90 tablet; Refill: 3  Acquired hypothyroidism -     TSH; Future -     Levothyroxine  Sodium; Take 1 tablet (75 mcg total) by mouth daily before breakfast.  Dispense: 90 tablet; Refill: 3  Morbid obesity (HCC) -Body mass index is 43.84 kg/m. -Continue lifestyle modifications -Consider referral to weight management -     CBC with Differential/Platelet; Future -     TSH; Future -     Vitamin B12; Future -     VITAMIN D  25 Hydroxy (Vit-D Deficiency, Fractures); Future  Mixed incontinence -Weight loss advised, limit caffeine intake, bladder training -Symptoms may be due to bladder prolapse status post history of hysterectomy -Start Vesicare  -For continued or worsening symptoms referral to urology -     Solifenacin  Succinate; Take 1 tablet (5 mg total) by mouth daily.  Dispense: 90 tablet; Refill: 3  Carpal tunnel syndrome of left wrist -History of carpal tunnel and right wrist status post release -Similar symptoms  in left wrist. -Referral to hand surgery placed -     CBC with Differential/Platelet; Future -     Ambulatory referral to Hand Surgery -     Vitamin B12; Future  Paresthesia -History of carpal tunnel syndrome. -Status post carpal tunnel release -Evaluate for vitamin deficiency -     Vitamin B12; Future  Dermatitis -     Ambulatory referral to  Dermatology  Mixed hyperlipidemia -Continue lifestyle modifications -     Lipid panel; Future -     Atorvastatin  Calcium ; Take 1 tablet (10 mg total) by mouth daily.  Dispense: 90 tablet; Refill: 3    Return in about 3 months (around 02/20/2024).   Clotilda JONELLE Single, MD

## 2023-11-24 ENCOUNTER — Other Ambulatory Visit (INDEPENDENT_AMBULATORY_CARE_PROVIDER_SITE_OTHER): Payer: 59

## 2023-11-24 DIAGNOSIS — I10 Essential (primary) hypertension: Secondary | ICD-10-CM | POA: Diagnosis not present

## 2023-11-24 DIAGNOSIS — E1169 Type 2 diabetes mellitus with other specified complication: Secondary | ICD-10-CM | POA: Diagnosis not present

## 2023-11-24 DIAGNOSIS — E039 Hypothyroidism, unspecified: Secondary | ICD-10-CM | POA: Diagnosis not present

## 2023-11-24 DIAGNOSIS — E782 Mixed hyperlipidemia: Secondary | ICD-10-CM | POA: Diagnosis not present

## 2023-11-24 DIAGNOSIS — G5602 Carpal tunnel syndrome, left upper limb: Secondary | ICD-10-CM

## 2023-11-24 DIAGNOSIS — R202 Paresthesia of skin: Secondary | ICD-10-CM

## 2023-11-24 LAB — CBC WITH DIFFERENTIAL/PLATELET
Basophils Absolute: 0 10*3/uL (ref 0.0–0.1)
Basophils Relative: 0.8 % (ref 0.0–3.0)
Eosinophils Absolute: 0.1 10*3/uL (ref 0.0–0.7)
Eosinophils Relative: 1.7 % (ref 0.0–5.0)
HCT: 38.6 % (ref 36.0–46.0)
Hemoglobin: 12.8 g/dL (ref 12.0–15.0)
Lymphocytes Relative: 34.5 % (ref 12.0–46.0)
Lymphs Abs: 2 10*3/uL (ref 0.7–4.0)
MCHC: 33.1 g/dL (ref 30.0–36.0)
MCV: 88.1 fL (ref 78.0–100.0)
Monocytes Absolute: 0.4 10*3/uL (ref 0.1–1.0)
Monocytes Relative: 6.7 % (ref 3.0–12.0)
Neutro Abs: 3.2 10*3/uL (ref 1.4–7.7)
Neutrophils Relative %: 56.3 % (ref 43.0–77.0)
Platelets: 207 10*3/uL (ref 150.0–400.0)
RBC: 4.38 Mil/uL (ref 3.87–5.11)
RDW: 15.6 % — ABNORMAL HIGH (ref 11.5–15.5)
WBC: 5.7 10*3/uL (ref 4.0–10.5)

## 2023-11-24 LAB — LIPID PANEL
Cholesterol: 153 mg/dL (ref 0–200)
HDL: 45.1 mg/dL (ref >39.00)
LDL Cholesterol: 78 mg/dL (ref 0–99)
NonHDL: 107.78
Total CHOL/HDL Ratio: 3
Triglycerides: 147 mg/dL (ref 0.0–149.0)
VLDL: 29.4 mg/dL (ref 0.0–40.0)

## 2023-11-24 LAB — VITAMIN D 25 HYDROXY (VIT D DEFICIENCY, FRACTURES): VITD: 30.18 ng/mL (ref 30.00–100.00)

## 2023-11-24 LAB — COMPREHENSIVE METABOLIC PANEL
ALT: 39 U/L — ABNORMAL HIGH (ref 0–35)
AST: 26 U/L (ref 0–37)
Albumin: 4.4 g/dL (ref 3.5–5.2)
Alkaline Phosphatase: 80 U/L (ref 39–117)
BUN: 20 mg/dL (ref 6–23)
CO2: 25 meq/L (ref 19–32)
Calcium: 9.5 mg/dL (ref 8.4–10.5)
Chloride: 99 meq/L (ref 96–112)
Creatinine, Ser: 0.89 mg/dL (ref 0.40–1.20)
GFR: 68.41 mL/min (ref >60.00)
Glucose, Bld: 202 mg/dL — ABNORMAL HIGH (ref 70–99)
Potassium: 3.8 meq/L (ref 3.5–5.1)
Sodium: 137 meq/L (ref 135–145)
Total Bilirubin: 0.6 mg/dL (ref 0.2–1.2)
Total Protein: 7.9 g/dL (ref 6.0–8.3)

## 2023-11-24 LAB — VITAMIN B12: Vitamin B-12: 397 pg/mL (ref 211–911)

## 2023-11-24 LAB — TSH: TSH: 1.44 u[IU]/mL (ref 0.35–5.50)

## 2023-11-24 LAB — HEMOGLOBIN A1C: Hgb A1c MFr Bld: 10 % — ABNORMAL HIGH (ref 4.6–6.5)

## 2023-12-05 ENCOUNTER — Telehealth: Payer: Self-pay | Admitting: Family Medicine

## 2023-12-05 NOTE — Telephone Encounter (Signed)
 [facility] sent fax they are unable to reach patient and please call  [phone]. I called pt and gave her [facility] phone number

## 2023-12-15 ENCOUNTER — Other Ambulatory Visit: Payer: Self-pay | Admitting: Family Medicine

## 2023-12-18 ENCOUNTER — Encounter: Payer: Self-pay | Admitting: Family Medicine

## 2023-12-22 ENCOUNTER — Ambulatory Visit (HOSPITAL_BASED_OUTPATIENT_CLINIC_OR_DEPARTMENT_OTHER): Payer: 59 | Admitting: Cardiovascular Disease

## 2023-12-22 VITALS — BP 116/70 | HR 77 | Ht 64.0 in | Wt 249.2 lb

## 2023-12-22 DIAGNOSIS — E782 Mixed hyperlipidemia: Secondary | ICD-10-CM

## 2023-12-22 DIAGNOSIS — I700 Atherosclerosis of aorta: Secondary | ICD-10-CM | POA: Diagnosis not present

## 2023-12-22 DIAGNOSIS — I10 Essential (primary) hypertension: Secondary | ICD-10-CM | POA: Diagnosis not present

## 2023-12-22 DIAGNOSIS — E1169 Type 2 diabetes mellitus with other specified complication: Secondary | ICD-10-CM | POA: Diagnosis not present

## 2023-12-22 NOTE — Progress Notes (Signed)
 [facility] Follow-up:    Date:  12/24/2023   ID:  Jessica Pace, DOB [birth year], MRN [id number]  PCP:  Deeann Saint, MD  Cardiologist:  Chilton Si, MD  Nephrologist:  Referring MD: Deeann Saint, MD   CC: Hypertension  History of Present Illness:    Jessica Pace is a 63 y.o. female with a hx of hypertension, hypothyroidism, type 2 diabetes, and pneumonia, here for follow up. She was initially seen 12/16/2021 in the [facility]. She was seen in Urgent Care 07/30/2021 with symptoms concerning for lower respiratory infection. She saw her PCP 09/2021 and her BP was 138/84 on olmesartan.  She was first diagnosed with hypertension in 2013. 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 Her blood pressure has become more difficult to control in the last 6-7 months. Olmesartan and HCTZ were both increased.   She followed up with our pharmacist and her blood pressure in the office was controlled but was running high at home. Amlodipine was added, and she was referred to Endocrine for diabetes control. She started the PREP exercise program but had to stop after her ex-husband died. At her last visit with the pharmacist her blood pressures were much better controlled.  She was subsequently started on Ozempic.  At her appointment 04/2022 blood pressures were better controlled.  She saw Gillian Shields, NP 08/2023 and reported swelling in her legs and face.  She was still recovering from an episode of pneumonia.  BNP was within normal limits.  Echo revealed LVEF 70-75% with a mild intracavitary gradient.  There is grade 1 diastolic dysfunction.  Hypertension is currently well-controlled. Initially, it was elevated due to stress related to personal concerns about mortality and separation from loved ones. She experiences shortness of breath during physical activity, which is concerning  given her family history of heart problems. A recent cardiac evaluation showed normal pulmonary function and a vigorous heart squeeze with a mild gradient. She lives alone and is mindful of maintaining her independence and physical strength.  She is concerned about her blood sugar levels, with an A1c of 10, and is scheduled to see an endocrinologist on the 27th. Her daughter, a Academic librarian, suspects thyroid issues might be contributing to her symptoms, despite normal thyroid levels. She has experienced unexplained weight gain, which her daughter believes could be related to a 'lower end thyroid' issue.  She has a history of low HDL cholesterol and recent lab results indicating an LDL of 78, which is close to the target of under 70. She is on atorvastatin. 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 She has a treadmill at home and access to an exercise room at work, but often feels too tired to exercise after work. She plans to utilize a nearby park for exercise once the weather improves. She is interested in juicing for health benefits but is cautious about the impact on her blood sugar levels.     Previous antihypertensives: None  Past Medical History:  Diagnosis Date   Allergy    Asthma    Complication of anesthesia    "takes me a long time to come out" (09/05/2016)   Diabetes mellitus without complication (HCC)    Hypertension    Pneumonia 1990's X 2; 2013   Thyroid disease  Type II diabetes mellitus (HCC)     Past Surgical History:  Procedure Laterality Date   ABDOMINAL HYSTERECTOMY  1998   CARPAL TUNNEL RELEASE Right ~ 2012   TUBAL LIGATION  1994    Current Medications: Current Meds  Medication Sig   albuterol (VENTOLIN HFA) 108 (90 Base) MCG/ACT inhaler Inhale 2 puffs into the lungs every 6 (six) hours as needed for wheezing or shortness of breath.   Ascorbic Acid (VITAMIN C) 1000 MG  tablet Take 1,000 mg by mouth daily.   atorvastatin (LIPITOR) 10 MG tablet Take 1 tablet (10 mg total) by mouth daily.   B-D ULTRAFINE III SHORT PEN 31G X 8 MM MISC USE AS DIRECTED   Blood Glucose Monitoring Suppl (ACCU-CHEK GUIDE ME) w/Device KIT See admin instructions.   Cod Liver Oil CAPS Take 1 capsule by mouth every morning.   GARLIC OIL PO Take 1 tablet by mouth daily.   Glucose Blood (BLOOD GLUCOSE TEST STRIPS) STRP Test twice a day. Pt uses one touch verio meter   ibuprofen (ADVIL) 600 MG tablet Take by mouth.   insulin NPH-regular Human (NOVOLIN 70/30) (70-30) 100 UNIT/ML injection Inject into the skin.   levothyroxine (SYNTHROID) 75 MCG tablet Take 1 tablet (75 mcg total) by mouth daily before breakfast.   NOVOLOG MIX 70/30 FLEXPEN (70-30) 100 UNIT/ML FlexPen 64 UNITS WITH BREAKFAST, 34 UNITS WITH LUNCH AND 64 UNITS WITH DINNER   olmesartan-hydrochlorothiazide (BENICAR HCT) 40-25 MG tablet Take 1 tablet by mouth daily.   ONETOUCH DELICA LANCETS FINE MISC Test twice a daily. Pt uses a one touch verio meter   solifenacin (VESICARE) 5 MG tablet Take 1 tablet (5 mg total) by mouth daily.     Allergies:   Canagliflozin, Dulaglutide, Insulin degludec, Metformin hcl, and Butyl acetate (n-butyl acetate)   Social History   Socioeconomic History   Marital status: Legally Separated    Spouse name: Not on file   Number of children: 2   Years of education: Not on file   Highest education level: Bachelor's degree (e.g., BA, AB, BS)  Occupational History   Occupation: Paramedic  Tobacco Use   Smoking status: Never   Smokeless tobacco: Never  Vaping Use   Vaping status: Never Used  Substance and Sexual Activity   Alcohol use: No   Drug use: No   Sexual activity: Not Currently    Birth control/protection: Surgical  Other Topics Concern   Not on file  Social History Narrative   ** Merged History Encounter **       Social Drivers of Health   Financial Resource  Strain: Low Risk  (10/19/2023)   Overall Financial Resource Strain (CARDIA)    Difficulty of Paying Living Expenses: Not hard at all  Food Insecurity: No Food Insecurity (10/19/2023)   Hunger Vital Sign    Worried About Running Out of Food in the Last Year: Never true    Ran Out of Food in the Last Year: Never true  Transportation Needs: No Transportation Needs (10/19/2023)   PRAPARE - Administrator, Civil Service (Medical): No    Lack of Transportation (Non-Medical): No  Physical Activity: Insufficiently Active (10/19/2023)   Exercise Vital Sign    Days of Exercise per Week: 1 day    Minutes of Exercise per Session: 30 min  Stress: Stress Concern Present (10/19/2023)   [employer] of Occupational Health - Occupational Stress Questionnaire    Feeling of Stress : To some extent  Social Connections: Moderately Integrated (10/19/2023)   Social Connection and Isolation Panel [NHANES]    Frequency of Communication with Friends and Family: More than three times a week    Frequency of Social Gatherings with Friends and Family: Three times a week    Attends Religious Services: More than 4 times per year    Active Member of Clubs or Organizations: Yes    Attends Engineer, structural: More than 4 times per year    Marital Status: Separated     Family History: The patient's family history includes Diabetes in her mother; Heart failure in her maternal grandmother; Hyperlipidemia in her father; Hypertension in her father, maternal grandmother, and mother; Obesity in her mother. There is no history of Colon cancer, Stomach cancer, Rectal cancer, Esophageal cancer, Liver cancer, or Breast cancer.  ROS:   Please see the history of present illness.    (+) Fatigue All other systems reviewed and are negative.  EKGs/Labs/Other Studies Reviewed:    Echo 09/2023  1. There is an intracavitary gradient. Peak velocity 1.81 m/s. Peak  gradient 13.1 mmHg. Left ventricular ejection  fraction, by estimation, is  70 to 75%. The left ventricle has hyperdynamic function. The left  ventricle has no regional wall motion  abnormalities. There is mild concentric left ventricular hypertrophy. Left  ventricular diastolic parameters are consistent with Grade I diastolic  dysfunction (impaired relaxation).   2. Right ventricular systolic function is normal. The right ventricular  size is normal.   3. The mitral valve is normal in structure. No evidence of mitral valve  regurgitation. No evidence of mitral stenosis.   4. The aortic valve is tricuspid. Aortic valve regurgitation is mild. No  aortic stenosis is present.   5. The inferior vena cava is normal in size with greater than 50%  respiratory variability, suggesting right atrial pressure of 3 mmHg.   CT Chest  03/15/2022: FINDINGS: Cardiovascular: Atherosclerotic calcification of the aorta. Heart size normal. No pericardial effusion.   Mediastinum/Nodes: Mediastinal and axillary lymph nodes are not enlarged by CT size criteria. Hilar regions are difficult to evaluate without IV contrast. Esophagus is grossly unremarkable.   Lungs/Pleura: 6 mm apical segment right upper lobe nodule (3/27). 6 mm right lower lobe nodule (3/58). Additional scattered smaller millimetric pulmonary nodules. Negative for subpleural reticulation, traction bronchiectasis/bronchiolectasis, ground-glass, architectural distortion or honeycombing. No pleural fluid. Airway is unremarkable. No air trapping.   Upper Abdomen: Visualized portions of the liver, adrenal glands, kidneys, spleen, pancreas, stomach and bowel are grossly unremarkable.   Musculoskeletal: Degenerative changes in the spine. No worrisome lytic or sclerotic lesions.   IMPRESSION: 1. No evidence of interstitial lung disease. 2. Pulmonary nodules measure up to 6 mm in the right upper and right lower lobes. Non-contrast chest CT at 3-6 months is recommended. If the nodules are  stable at time of repeat CT, then future CT at 18-24 months (from today's scan) is considered optional for low-risk patients, but is recommended for high-risk patients. This recommendation follows the consensus statement: Guidelines for Management of Incidental Pulmonary Nodules Detected on CT Images: From the Fleischner Society 2017; Radiology 2017; 284:228-243. 3.  Aortic atherosclerosis (ICD10-I70.0).  Left LE Venous Doppler 06/14/2021: Summary:  Left:  - No evidence of deep vein thrombosis seen in the left lower extremity,  from the common femoral through the popliteal veins.  - No evidence of superficial venous thrombosis in the left lower  extremity.     - Venous reflux is noted in  the left sapheno-femoral junction.   EKG:  EKG is personally reviewed. 05/16/2022:  EKG was not ordered. 12/16/2021: EKG was not ordered.  Recent Labs: 09/06/2023: BNP 13.7 11/24/2023: ALT 39; BUN 20; Creatinine, Ser 0.89; Hemoglobin 12.8; Platelets 207.0; Potassium 3.8; Sodium 137; TSH 1.44   Recent Lipid Panel    Component Value Date/Time   CHOL 153 11/24/2023 1054   CHOL 193 03/31/2022 1625   TRIG 147.0 11/24/2023 1054   HDL 45.10 11/24/2023 1054   HDL 35 (L) 03/31/2022 1625   CHOLHDL 3 11/24/2023 1054   VLDL 29.4 11/24/2023 1054   LDLCALC 78 11/24/2023 1054   LDLCALC 112 (H) 03/31/2022 1625    Physical Exam:    VS:  BP 116/70   Pulse 77   Ht 5\' 4"  (1.626 m)   Wt 249 lb 3.2 oz (113 kg)   SpO2 97%   BMI 42.78 kg/m  , BMI Body mass index is 42.78 kg/m. GENERAL:  Well appearing HEENT: Pupils equal round and reactive, fundi not visualized, oral mucosa unremarkable NECK:  No jugular venous distention, waveform within normal limits, carotid upstroke brisk and symmetric, no bruits, no thyromegaly LUNGS: No crackles, rhonchi or wheezes HEART:  RRR.  PMI not displaced or sustained,S1 and S2 within normal limits, no S3, no S4, no clicks, no rubs, no murmurs ABD:  Flat, positive bowel sounds  normal in frequency in pitch, no bruits, no rebound, no guarding, no midline pulsatile mass, no hepatomegaly, no splenomegaly EXT:  2 plus pulses throughout, no edema, no cyanosis no clubbing SKIN:  No rashes no nodules NEURO:  Cranial nerves II through XII grossly intact, motor grossly intact throughout PSYCH:  Cognitively intact, oriented to person place and time   ASSESSMENT/PLAN:    # Hyperlipidemia LDL slightly elevated at 78, goal is under 70. Patient is currently on atorvastatin. -Encourage increased exercise to improve LDL levels. -Continue atorvastatin.  # Type 2 Diabetes Mellitus A1c of 10, indicating poor glycemic control. Patient has an upcoming appointment with endocrinology. -Encourage discussion with endocrinologist about dietary changes, including potential impact of juicing on blood sugar levels.  # Hypertension Well-controlled, no current issues. Continue olmesartan/hydrochlorothiazide.  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 -Encourage utilization of workplace exercise room or walking at nearby park after work. -Emphasize importance of regular physical activity for cardiovascular health and maintaining independence.   Follow-up in 1 year.   Screening for Secondary Hypertension:     12/16/2021    4:26 PM  Causes  Drugs/Herbals Screened  Renovascular HTN N/A  Sleep Apnea Screened     - Comments Snores.  No other symptoms.  Thyroid Disease Screened  Hyperaldosteronism N/A  Cushing's Syndrome N/A  Hyperparathyroidism Screened  Coarctation of the Aorta Screened     - Comments BP symmetric  Compliance Screened    Relevant Labs/Studies:    Latest Ref Rng & Units 11/24/2023   10:54 AM 09/06/2023    8:29 AM 06/26/2023   12:39 PM  Basic Labs  Sodium 135 - 145 mEq/L 137  138  138   Potassium 3.5 - 5.1 mEq/L 3.8  4.1  5.0   Creatinine 0.40 - 1.20 mg/dL 1.61  0.96  0.45        Latest Ref Rng  & Units 11/24/2023   10:54 AM 08/19/2022   11:43 AM  Thyroid   TSH 0.35 - 5.50 uIU/mL 1.44  1.340  Disposition:    FU with Kaipo Ardis C. Duke Salvia, MD, [facility] in 1 year  Medication Adjustments/Labs and Tests Ordered: Current medicines are reviewed at length with the patient today.  Concerns regarding medicines are outlined above.   No orders of the defined types were placed in this encounter.  No orders of the defined types were placed in this encounter.    Signed, Chilton Si, MD  12/24/2023 8:00 AM    [facility]

## 2023-12-22 NOTE — Patient Instructions (Signed)
 Medication Instructions:  Your physician recommends that you continue on your current medications as directed. Please refer to the Current Medication list given to you today.   Labwork: NONE  Testing/Procedures: NONE  Follow-Up: 1 YEAR WITH DR [place] OR CAITLIN W NP IN [facility]N CLINIC   Any Other Special Instructions Will Be Listed Below (If Applicable). Exercise recommendations: The American Heart Association recommends 150 minutes of moderate intensity exercise weekly. Try 30 minutes of moderate intensity exercise 4-5 times per week. This could include walking, jogging, or swimming.  If you need a refill on your cardiac medications before your next appointment, please call your pharmacy.

## 2023-12-24 ENCOUNTER — Encounter (HOSPITAL_BASED_OUTPATIENT_CLINIC_OR_DEPARTMENT_OTHER): Payer: Self-pay | Admitting: Cardiovascular Disease

## 2023-12-30 ENCOUNTER — Other Ambulatory Visit: Payer: Self-pay | Admitting: Family Medicine

## 2023-12-30 DIAGNOSIS — L309 Dermatitis, unspecified: Secondary | ICD-10-CM

## 2024-01-04 ENCOUNTER — Other Ambulatory Visit: Payer: Self-pay

## 2024-01-04 DIAGNOSIS — E1169 Type 2 diabetes mellitus with other specified complication: Secondary | ICD-10-CM

## 2024-01-08 ENCOUNTER — Other Ambulatory Visit: Payer: 59

## 2024-01-09 LAB — COMPREHENSIVE METABOLIC PANEL
AG Ratio: 1.4 (calc) (ref 1.0–2.5)
ALT: 40 U/L — ABNORMAL HIGH (ref 6–29)
AST: 24 U/L (ref 10–35)
Albumin: 4.3 g/dL (ref 3.6–5.1)
Alkaline phosphatase (APISO): 82 U/L (ref 37–153)
BUN: 15 mg/dL (ref 7–25)
CO2: 30 mmol/L (ref 20–32)
Calcium: 9.3 mg/dL (ref 8.6–10.4)
Chloride: 102 mmol/L (ref 98–110)
Creat: 0.9 mg/dL (ref 0.50–1.05)
Globulin: 3 g/dL (ref 1.9–3.7)
Glucose, Bld: 191 mg/dL — ABNORMAL HIGH (ref 65–99)
Potassium: 4.5 mmol/L (ref 3.5–5.3)
Sodium: 138 mmol/L (ref 135–146)
Total Bilirubin: 0.5 mg/dL (ref 0.2–1.2)
Total Protein: 7.3 g/dL (ref 6.1–8.1)
eGFR: 71 mL/min/{1.73_m2} (ref >=60)

## 2024-01-09 LAB — LIPID PANEL
Cholesterol: 118 mg/dL (ref <200)
HDL: 39 mg/dL — ABNORMAL LOW (ref >=50)
LDL Cholesterol (Calc): 61 mg/dL
Non-HDL Cholesterol (Calc): 79 mg/dL (ref <130)
Total CHOL/HDL Ratio: 3 (calc) (ref <5.0)
Triglycerides: 93 mg/dL (ref <150)

## 2024-01-09 LAB — MICROALBUMIN / CREATININE URINE RATIO
Creatinine, Urine: 114 mg/dL (ref 20–275)
Microalb Creat Ratio: 8 mg/g{creat} (ref <30)
Microalb, Ur: 0.9 mg/dL

## 2024-01-09 LAB — C-PEPTIDE: C-Peptide: 1.71 ng/mL (ref 0.80–3.85)

## 2024-01-11 ENCOUNTER — Other Ambulatory Visit: Payer: Self-pay | Admitting: "Endocrinology

## 2024-01-11 ENCOUNTER — Ambulatory Visit (INDEPENDENT_AMBULATORY_CARE_PROVIDER_SITE_OTHER): Payer: 59 | Admitting: "Endocrinology

## 2024-01-11 ENCOUNTER — Encounter: Payer: Self-pay | Admitting: "Endocrinology

## 2024-01-11 VITALS — BP 110/70 | HR 75 | Ht 64.0 in | Wt 251.0 lb

## 2024-01-11 DIAGNOSIS — E786 Lipoprotein deficiency: Secondary | ICD-10-CM

## 2024-01-11 DIAGNOSIS — E1165 Type 2 diabetes mellitus with hyperglycemia: Secondary | ICD-10-CM

## 2024-01-11 DIAGNOSIS — E039 Hypothyroidism, unspecified: Secondary | ICD-10-CM | POA: Diagnosis not present

## 2024-01-11 DIAGNOSIS — Z794 Long term (current) use of insulin: Secondary | ICD-10-CM | POA: Diagnosis not present

## 2024-01-11 MED ORDER — FREESTYLE LIBRE 3 PLUS SENSOR MISC: 3 refills | Status: DC

## 2024-01-11 MED ORDER — BAQSIMI ONE PACK 3 MG/DOSE NA POWD: 1.0000 | NASAL | 3 refills | Status: AC | PRN

## 2024-01-11 NOTE — Progress Notes (Signed)
 Outpatient Endocrinology Note Jessica [place], MD  01/11/24   Alvera Novel 24-Dec-1960 [id number]  Referring Provider: Deeann Saint, MD Primary Care Provider: Deeann Saint, MD Reason for consultation: Subjective   Assessment & Plan  Diagnoses and all orders for this visit:  Uncontrolled type 2 diabetes mellitus with hyperglycemia (HCC)  Long-term insulin use (HCC)  Low HDL (under 40)  Acquired hypothyroidism  Morbid obesity (HCC)  Other orders -     Continuous Glucose Sensor (FREESTYLE LIBRE 3 PLUS SENSOR) MISC; Change sensor every 15 days. -     Glucagon (BAQSIMI ONE PACK) 3 MG/DOSE POWD; Place 1 Device into the nose as needed (Low blood sugar with impaired consciousness).    Diabetes Type II complicated by neuropathy,  Lab Results  Component Value Date   GFR 68.41 11/24/2023   Hba1c goal less than 7, current Hba1c is  Lab Results  Component Value Date   HGBA1C 10.0 (H) 11/24/2023   Will recommend the following: Novolin 70/30: 64 units before break fast, 24 units before lunch, 64 units before supper 30 min before meals  Cannot adjust insulin doses today due to lack fo BG log Checks BG tidac, given log sheet Ordered libre, patient to come to clinic if needs help putting it on Ordered diabetes education  No known contraindications/side effects to any of above medications Glucagon discussed and prescribed with refills on 12/2023    Doesn't want to do ozempic due to constipation, discussed about mounajro   No history of MEN syndrome/medullary thyroid cancer/pancreatitis or pancreatic cancer in self or family  TSH WNL, last checked in 11/2022 On levothyroxine/synthroid 75 MCG tablet po every day   -Last LD and Tg are as follows: Lab Results  Component Value Date   LDLCALC 61 01/08/2024    Lab Results  Component Value Date   TRIG 93 01/08/2024   -not on statin  -Follow low fat diet and exercise and nuts as tolerated    -Blood pressure goal  <140/90 - Microalbumin/creatinine goal is < 30 -Last MA/Cr is as follows: Lab Results  Component Value Date   MICROALBUR 0.9 01/08/2024   -not on ACE/ARB  -diet changes including salt restriction -limit eating outside -counseled BP targets per standards of diabetes care -uncontrolled blood pressure can lead to retinopathy, nephropathy and cardiovascular and atherosclerotic heart disease  Reviewed and counseled on: -A1C target -Blood sugar targets -Complications of uncontrolled diabetes  -Checking blood sugar before meals and bedtime and bring log next visit -All medications with mechanism of action and side effects -Hypoglycemia management: rule of 15's, Glucagon Emergency Kit and medical alert ID -low-carb low-fat plate-method diet -At least 20 minutes of physical activity per day -Annual dilated retinal eye exam and foot exam -compliance and follow up needs -follow up as scheduled or earlier if problem gets worse  Call if blood sugar is less than 70 or consistently above 250    Take a 15 gm snack of carbohydrate at bedtime before you go to sleep if your blood sugar is less than 100.    If you are going to fast after midnight for a test or procedure, ask your physician for instructions on how to reduce/decrease your insulin dose.    Call if blood sugar is less than 70 or consistently above 250  -Treating a low sugar by rule of 15  (15 gms of sugar every 15 min until sugar is more than 70) If you feel your sugar is low, test  your sugar to be sure If your sugar is low (less than 70), then take 15 grams of a fast acting Carbohydrate (3-4 glucose tablets or glucose gel or 4 ounces of juice or regular soda) Recheck your sugar 15 min after treating low to make sure it is more than 70 If sugar is still less than 70, treat again with 15 grams of carbohydrate          Don't drive the hour of hypoglycemia  If unconscious/unable to eat or drink by mouth, use glucagon injection or nasal  spray baqsimi and call 911. Can repeat again in 15 min if still unconscious.  Return in about 4 weeks (around 02/08/2024).   I have reviewed current medications, nurse's notes, allergies, vital signs, past medical and surgical history, family medical history, and social history for this encounter. Counseled patient on symptoms, examination findings, lab findings, imaging results, treatment decisions and monitoring and prognosis. The patient understood the recommendations and agrees with the treatment plan. All questions regarding treatment plan were fully answered.  Jessica Maunabo, MD  01/11/24    History of Present Illness Jessica Pace is a 63 y.o. year old female who presents for evaluation of Type II diabetes mellitus.  Jessica Pace was first diagnosed in 2015.   Diabetes education -  Home diabetes regimen: Novolin 70/30: 64 units qam right before break fast, 24 units right before lunch, 64 units   COMPLICATIONS -  MI/Stroke -  retinopathy +  neuropathy -  nephropathy  SYMPTOMS REVIEWED + Polyuria - Weight loss + Blurred vision  BLOOD SUGAR DATA Did not bring meter Checks fasting and at bedtime Reports 150s-190s  Physical Exam  BP 110/70   Pulse 75   Ht 5\' 4"  (1.626 m)   Wt 251 lb (113.9 kg)   SpO2 99%   BMI 43.08 kg/m    Constitutional: well developed, well nourished Head: normocephalic, atraumatic Eyes: sclera anicteric, no redness Neck: supple Lungs: normal respiratory effort Neurology: alert and oriented Skin: dry, no appreciable rashes Musculoskeletal: no appreciable defects Psychiatric: normal mood and affect Diabetic Foot Exam - Simple   No data filed      Current Medications Patient's Medications  New Prescriptions   CONTINUOUS GLUCOSE SENSOR (FREESTYLE LIBRE 3 PLUS SENSOR) MISC    Change sensor every 15 days.   GLUCAGON (BAQSIMI ONE PACK) 3 MG/DOSE POWD    Place 1 Device into the nose as needed (Low blood sugar with impaired  consciousness).  Previous Medications   ALBUTEROL (VENTOLIN HFA) 108 (90 BASE) MCG/ACT INHALER    Inhale 2 puffs into the lungs every 6 (six) hours as needed for wheezing or shortness of breath.   ALBUTEROL (VENTOLIN HFA) 108 (90 BASE) MCG/ACT INHALER    Inhale 1-2 puffs into the lungs every 6 (six) hours as needed for wheezing or shortness of breath.   ASCORBIC ACID (VITAMIN C) 1000 MG TABLET    Take 1,000 mg by mouth daily.   ATORVASTATIN (LIPITOR) 10 MG TABLET    Take 1 tablet (10 mg total) by mouth daily.   B-D ULTRAFINE III SHORT PEN 31G X 8 MM MISC    USE AS DIRECTED   BLOOD GLUCOSE MONITORING SUPPL (ACCU-CHEK GUIDE ME) W/DEVICE KIT    See admin instructions.   COD LIVER OIL CAPS    Take 1 capsule by mouth every morning.   CONTINUOUS GLUCOSE SENSOR (FREESTYLE LIBRE 3 PLUS SENSOR) MISC    1 EACH BY DOES NOT APPLY  ROUTE EVERY 14 (FOURTEEN) DAYS.   FUROSEMIDE (LASIX) 20 MG TABLET    Take one tablet daily for 3 days then as directed by cardiology office.   GARLIC OIL PO    Take 1 tablet by mouth daily.   GLUCOSE BLOOD (BLOOD GLUCOSE TEST STRIPS) STRP    Test twice a day. Pt uses one touch verio meter   IBUPROFEN (ADVIL) 600 MG TABLET    Take by mouth.   INSULIN NPH-REGULAR HUMAN (NOVOLIN 70/30) (70-30) 100 UNIT/ML INJECTION    Inject into the skin.   INSULIN PEN NEEDLE (PEN NEEDLES) 32G X 4 MM MISC    1 each by Other route daily. Use with Basalgar   LEVOTHYROXINE (SYNTHROID) 75 MCG TABLET    Take 1 tablet (75 mcg total) by mouth daily before breakfast.   NOVOLOG MIX 70/30 FLEXPEN (70-30) 100 UNIT/ML FLEXPEN    64 UNITS WITH BREAKFAST, 34 UNITS WITH LUNCH AND 64 UNITS WITH DINNER   OLMESARTAN-HYDROCHLOROTHIAZIDE (BENICAR HCT) 40-25 MG TABLET    Take 1 tablet by mouth daily.   ONETOUCH DELICA LANCETS FINE MISC    Test twice a daily. Pt uses a one touch verio meter   SOLIFENACIN (VESICARE) 5 MG TABLET    Take 1 tablet (5 mg total) by mouth daily.   TRIAMCINOLONE (KENALOG) 0.025 % OINTMENT     APPLY 1 APPLICATION TOPICALLY 2 (TWO) TIMES DAILY. NOT FOR USE ON FACE.  Modified Medications   No medications on file  Discontinued Medications   No medications on file    Allergies Allergies  Allergen Reactions   Canagliflozin     Other reaction(s): yeast   Dulaglutide Other (See Comments)    Patient reports it caused flare of diverticulitis.   Insulin Degludec Other (See Comments)   Metformin Hcl Diarrhea and Other (See Comments)   Butyl Acetate (N-Butyl Acetate) Other (See Comments)    In paints and nail polish- unknown    Past Medical History Past Medical History:  Diagnosis Date   Allergy    Asthma    Complication of anesthesia    "takes me a long time to come out" (09/05/2016)   Diabetes mellitus without complication (HCC)    Hypertension    Pneumonia 1990's X 2; 2013   Thyroid disease    Type II diabetes mellitus (HCC)     Past Surgical History Past Surgical History:  Procedure Laterality Date   ABDOMINAL HYSTERECTOMY  1998   CARPAL TUNNEL RELEASE Right ~ 2012   TUBAL LIGATION  1994    Family History family history includes Diabetes in her mother; Heart failure in her maternal grandmother; Hyperlipidemia in her father; Hypertension in her father, maternal grandmother, and mother; Obesity in her mother.  Social History Social History   Socioeconomic History   Marital status: Legally Separated    Spouse name: Not on file   Number of children: 2   Years of education: Not on file   Highest education level: Bachelor's degree (e.g., BA, AB, BS)  Occupational History   Occupation: Paramedic  Tobacco Use   Smoking status: Never   Smokeless tobacco: Never  Vaping Use   Vaping status: Never Used  Substance and Sexual Activity   Alcohol use: No   Drug use: No   Sexual activity: Not Currently    Birth control/protection: Surgical  Other Topics Concern   Not on file  Social History Narrative   ** Merged History Encounter **  Social Drivers of Corporate investment banker Strain: Low Risk  (10/19/2023)   Overall Financial Resource Strain (CARDIA)    Difficulty of Paying Living Expenses: Not hard at all  Food Insecurity: No Food Insecurity (10/19/2023)   Hunger Vital Sign    Worried About Running Out of Food in the Last Year: Never true    Ran Out of Food in the Last Year: Never true  Transportation Needs: No Transportation Needs (10/19/2023)   PRAPARE - Administrator, Civil Service (Medical): No    Lack of Transportation (Non-Medical): No  Physical Activity: Insufficiently Active (10/19/2023)   Exercise Vital Sign    Days of Exercise per Week: 1 day    Minutes of Exercise per Session: 30 min  Stress: Stress Concern Present (10/19/2023)   [employer] of Occupational Health - Occupational Stress Questionnaire    Feeling of Stress : To some extent  Social Connections: Moderately Integrated (10/19/2023)   Social Connection and Isolation Panel [NHANES]    Frequency of Communication with Friends and Family: More than three times a week    Frequency of Social Gatherings with Friends and Family: Three times a week    Attends Religious Services: More than 4 times per year    Active Member of Clubs or Organizations: Yes    Attends Banker Meetings: More than 4 times per year    Marital Status: Separated  Intimate Partner Violence: Unknown (05/16/2022)   Received from [facility]   HITS    Physically Hurt: Not on file    Insult or Talk Down To: Not on file    Threaten Physical Harm: Not on file    Scream or Curse: Not on file    Lab Results  Component Value Date   HGBA1C 10.0 (H) 11/24/2023   HGBA1C 8.9 (A) 03/31/2022   HGBA1C 9.3 (A) 11/29/2021   Lab Results  Component Value Date   CHOL 118 01/08/2024   Lab Results  Component Value Date   HDL 39 (L) 01/08/2024   Lab Results  Component Value Date   LDLCALC 61 01/08/2024   Lab Results  Component Value Date    TRIG 93 01/08/2024   Lab Results  Component Value Date   CHOLHDL 3.0 01/08/2024   Lab Results  Component Value Date   CREATININE 0.90 01/08/2024   Lab Results  Component Value Date   GFR 68.41 11/24/2023   Lab Results  Component Value Date   MICROALBUR 0.9 01/08/2024      Component Value Date/Time   NA 138 01/08/2024 0937   NA 138 09/06/2023 0829   K 4.5 01/08/2024 0937   CL 102 01/08/2024 0937   CO2 30 01/08/2024 0937   GLUCOSE 191 (H) 01/08/2024 0937   BUN 15 01/08/2024 0937   BUN 14 09/06/2023 0829   CREATININE 0.90 01/08/2024 0937   CALCIUM 9.3 01/08/2024 0937   PROT 7.3 01/08/2024 0937   PROT 7.9 08/19/2022 1143   ALBUMIN 4.4 11/24/2023 1054   ALBUMIN 4.7 08/19/2022 1143   AST 24 01/08/2024 0937   ALT 40 (H) 01/08/2024 0937   ALKPHOS 80 11/24/2023 1054   BILITOT 0.5 01/08/2024 0937   BILITOT 0.4 08/19/2022 1143   GFRNONAA >60 06/26/2023 1239   GFRNONAA 71 01/18/2017 1019   GFRAA >60 09/07/2019 1037   GFRAA 82 01/18/2017 1019      Latest Ref Rng & Units 01/08/2024    9:37 AM 11/24/2023   10:54  AM 09/06/2023    8:29 AM  BMP  Glucose 65 - 99 mg/dL 161  096  045   BUN 7 - 25 mg/dL 15  20  14    Creatinine 0.50 - 1.05 mg/dL 4.09  8.11  9.14   BUN/Creat Ratio 6 - 22 (calc) SEE NOTE:   14   Sodium 135 - 146 mmol/L 138  137  138   Potassium 3.5 - 5.3 mmol/L 4.5  3.8  4.1   Chloride 98 - 110 mmol/L 102  99  99   CO2 20 - 32 mmol/L 30  25  22    Calcium 8.6 - 10.4 mg/dL 9.3  9.5  9.3        Component Value Date/Time   WBC 5.7 11/24/2023 1054   RBC 4.38 11/24/2023 1054   HGB 12.8 11/24/2023 1054   HGB 12.5 09/06/2023 0829   HCT 38.6 11/24/2023 1054   HCT 39.3 09/06/2023 0829   PLT 207.0 11/24/2023 1054   PLT 236 09/06/2023 0829   MCV 88.1 11/24/2023 1054   MCV 90 09/06/2023 0829   MCH 28.5 09/06/2023 0829   MCH 29.3 06/26/2023 1239   MCHC 33.1 11/24/2023 1054   RDW 15.6 (H) 11/24/2023 1054   RDW 14.1 09/06/2023 0829   LYMPHSABS 2.0 11/24/2023 1054    LYMPHSABS 2.2 05/28/2018 1539   MONOABS 0.4 11/24/2023 1054   EOSABS 0.1 11/24/2023 1054   EOSABS 0.1 05/28/2018 1539   BASOSABS 0.0 11/24/2023 1054   BASOSABS 0.0 05/28/2018 1539     Parts of this note may have been dictated using voice recognition software. There may be variances in spelling and vocabulary which are unintentional. Not all errors are proofread. Please notify the Thereasa Parkin if any discrepancies are noted or if the meaning of any statement is not clear.

## 2024-01-11 NOTE — Patient Instructions (Signed)

## 2024-01-22 ENCOUNTER — Other Ambulatory Visit: Payer: Self-pay | Admitting: Family Medicine

## 2024-02-05 ENCOUNTER — Ambulatory Visit: Admitting: "Endocrinology

## 2024-02-13 ENCOUNTER — Other Ambulatory Visit: Payer: Self-pay | Admitting: Family Medicine

## 2024-02-13 DIAGNOSIS — E1169 Type 2 diabetes mellitus with other specified complication: Secondary | ICD-10-CM

## 2024-02-16 ENCOUNTER — Other Ambulatory Visit: Payer: Self-pay | Admitting: Family Medicine

## 2024-02-16 DIAGNOSIS — E1169 Type 2 diabetes mellitus with other specified complication: Secondary | ICD-10-CM

## 2024-02-16 NOTE — Telephone Encounter (Unsigned)
 Copied from CRM [id number]. Topic: Clinical - Medication Refill >> Feb 16, 2024  9:22 AM Juluis Ok wrote: Most Recent Primary Care Visit:  Provider: [facility]  Department: [facility]  Visit Type: LAB  Date: 11/24/2023  Medication: NOVOLOG  MIX 70/30 FLEXPEN (70-30) 100 UNIT/ML FlexPen nsulin Pen Needle (PEN NEEDLES) 32G X 8 MM MISC   Has the patient contacted their pharmacy? Yes (Agent: If no, request that the patient contact the pharmacy for the refill. If patient does not wish to contact the pharmacy document the reason why and proceed with request.) (Agent: If yes, when and what did the pharmacy advise?)  Is this the correct pharmacy for this prescription? Yes If no, delete pharmacy and type the correct one.  This is the patient's preferred pharmacy:  [id number] - [place], [place] - [address] AT [address] OF [facility] [address] [place] [address] Phone: [phone] Fax: [phone]   Has the prescription been filled recently? No  Is the patient out of the medication? Yes  Has the patient been seen for an appointment in the last year OR does the patient have an upcoming appointment? Yes  Can we respond through MyChart? Yes  Agent: Please be advised that Rx refills may take up to 3 business days. We ask that you follow-up with your pharmacy.

## 2024-02-21 ENCOUNTER — Ambulatory Visit

## 2024-02-21 ENCOUNTER — Encounter: Payer: Self-pay | Admitting: Family Medicine

## 2024-02-21 ENCOUNTER — Ambulatory Visit (INDEPENDENT_AMBULATORY_CARE_PROVIDER_SITE_OTHER): Payer: 59 | Admitting: Family Medicine

## 2024-02-21 VITALS — BP 142/86 | HR 81 | Temp 98.6°F | Ht 64.0 in | Wt 243.2 lb

## 2024-02-21 DIAGNOSIS — E039 Hypothyroidism, unspecified: Secondary | ICD-10-CM | POA: Diagnosis not present

## 2024-02-21 DIAGNOSIS — E1169 Type 2 diabetes mellitus with other specified complication: Secondary | ICD-10-CM

## 2024-02-21 DIAGNOSIS — E782 Mixed hyperlipidemia: Secondary | ICD-10-CM

## 2024-02-21 DIAGNOSIS — I10 Essential (primary) hypertension: Secondary | ICD-10-CM | POA: Diagnosis not present

## 2024-02-21 DIAGNOSIS — L309 Dermatitis, unspecified: Secondary | ICD-10-CM

## 2024-02-21 DIAGNOSIS — G5602 Carpal tunnel syndrome, left upper limb: Secondary | ICD-10-CM

## 2024-02-21 DIAGNOSIS — Z7984 Long term (current) use of oral hypoglycemic drugs: Secondary | ICD-10-CM

## 2024-02-21 DIAGNOSIS — Z794 Long term (current) use of insulin: Secondary | ICD-10-CM

## 2024-02-21 LAB — POCT GLYCOSYLATED HEMOGLOBIN (HGB A1C): Hemoglobin A1C: 10.3 % — AB (ref 4.0–5.6)

## 2024-02-21 NOTE — Patient Instructions (Addendum)
 A new referral to dermatology and endocrinology were placed.  You should be contacted about setting up these appointments.  Your A1c was 10.3% this time.  It was 10% on 11/24/2023.  Continue working to decrease the amount of sweets and carbohydrates you are eating as well as increasing your physical activity.

## 2024-02-21 NOTE — Progress Notes (Signed)
 Established Patient Office Visit   Subjective  Patient ID: Jessica Pace, female    DOB: June 26, 1961  Age: 63 y.o. MRN: [id number]  Chief Complaint  Patient presents with   Medical Management of Chronic Issues    3 month follow-up     Patient is a 63 year old female seen for follow-up on chronic conditions.  Patient states she ran out of NovoLog  x 1 week.  Patient was unaware that a new prescription had been sent to the pharmacy as they did not notify her it was available.  Patient has not been checking blood sugar as she was afraid to know how high it was.  Has Rx for CGM but is yet to place it.  Patient has lost 8 pounds since last office visit in March.  Patient was sent to [facility] endocrinology and leptin dermatology but endorses less than favorable experience with the providers.  States dermatology provider did not look at areas of concern nor touch her.  Patient is scheduled for carpal tunnel release of left hand 03/06/2024.  EMG/NCS were positive.  Previously had right hand done.    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/2022   Aortic atherosclerosis (HCC) 03/31/2022   Mixed incontinence 11/29/2021   History of cancer of uterine body 06/14/2021   Hypothyroidism 06/14/2021   Pulmonary nodule 06/14/2021   Elevated LFTs 09/13/2016   Diverticulitis 09/04/2016   Type 2 diabetes mellitus with morbid obesity (HCC) 11/02/2015   History of hypothyroidism 09/01/2015   Obesity (BMI 30-39.9) 08/10/2015   Diabetes type 2, uncontrolled 08/10/2015   Essential hypertension 08/10/2015   History of vitamin D  deficiency 08/10/2015   History of headache 08/10/2015   Mixed hyperlipidemia 02/28/2012   Vitamin D  deficiency 02/28/2012   Past Medical History:  Diagnosis Date   Allergy    Asthma    Complication of anesthesia    "takes me a long time to come out"  (09/05/2016)   Diabetes mellitus without complication (HCC)    Hypertension    Pneumonia 1990's X 2; 2013   Thyroid disease    Type II diabetes mellitus (HCC)    Past Surgical History:  Procedure Laterality Date   ABDOMINAL HYSTERECTOMY  1998   CARPAL TUNNEL RELEASE Right ~ 2012   TUBAL LIGATION  1994   Social History   Tobacco Use   Smoking status: Never   Smokeless tobacco: Never  Vaping Use   Vaping status: Never Used  Substance Use Topics   Alcohol use: No   Drug use: No   Family History  Problem Relation Age of Onset   Diabetes Mother    Hypertension Mother    Obesity Mother    Hyperlipidemia Father    Hypertension Father    Hypertension Maternal Grandmother    Heart failure Maternal Grandmother    Colon cancer Neg Hx    Stomach cancer Neg Hx    Rectal cancer Neg Hx    Esophageal cancer Neg Hx    Liver cancer Neg Hx    Breast cancer Neg Hx    Allergies  Allergen Reactions   Canagliflozin     Other reaction(s): yeast   Dulaglutide  Other (See Comments)    Patient reports it caused flare of diverticulitis.   Insulin  Degludec Other (See Comments)   Metformin  Hcl Diarrhea and Other (See Comments)   Butyl Acetate (N-Butyl Acetate) Other (  See Comments)    In paints and nail polish- unknown      ROS Negative unless stated above    Objective:     BP (!) 142/86 (BP Location: Right Arm, Patient Position: Sitting, Cuff Size: Normal)   Pulse 81   Temp 98.6 F (37 C) (Oral)   Ht 5\' 4"  (1.626 m)   Wt 243 lb 3.2 oz (110.3 kg)   SpO2 93%   BMI 41.75 kg/m  BP Readings from Last 3 Encounters:  02/21/24 (!) 142/86  01/11/24 110/70  12/22/23 116/70   Wt Readings from Last 3 Encounters:  02/21/24 243 lb 3.2 oz (110.3 kg)  01/11/24 251 lb (113.9 kg)  12/22/23 249 lb 3.2 oz (113 kg)      Physical Exam Constitutional:      General: She is not in acute distress.    Appearance: Normal appearance.  HENT:     Head: Normocephalic and atraumatic.     Nose:  Nose normal.     Mouth/Throat:     Mouth: Mucous membranes are moist.  Cardiovascular:     Rate and Rhythm: Normal rate and regular rhythm.     Heart sounds: Normal heart sounds. No murmur heard.    No gallop.  Pulmonary:     Effort: Pulmonary effort is normal. No respiratory distress.     Breath sounds: Normal breath sounds. No wheezing, rhonchi or rales.  Skin:    General: Skin is warm and dry.     Comments: Nevi on face.  Area of hyperpigmentation on bilateral cheeks.  Neurological:     Mental Status: She is alert and oriented to person, place, and time.        11/23/2023    3:53 PM 08/16/2023    4:32 PM 10/21/2022    3:22 PM  Depression screen PHQ 2/9  Decreased Interest 0 0 0  Down, Depressed, Hopeless 0 0 0  PHQ - 2 Score 0 0 0  Altered sleeping 1 2 0  Tired, decreased energy 1 3 1   Change in appetite 0 2 1  Feeling bad or failure about yourself  0 0 0  Trouble concentrating 0 0 0  Moving slowly or fidgety/restless 0 0 0  Suicidal thoughts 0 0 0  PHQ-9 Score 2 7 2   Difficult doing work/chores Somewhat difficult Somewhat difficult       11/23/2023    3:54 PM 08/16/2023    4:32 PM 10/21/2022    3:23 PM 10/06/2022    8:55 AM  GAD 7 : Generalized Anxiety Score  Nervous, Anxious, on Edge 0 0 0 0  Control/stop worrying 0 0 0 0  Worry too much - different things 0 0 0 0  Trouble relaxing 0 2 0 0  Restless 0 0 0 0  Easily annoyed or irritable 0 0 0 0  Afraid - awful might happen 0 0 0 0  Total GAD 7 Score 0 2 0 0  Anxiety Difficulty Not difficult at all        Results for orders placed or performed in visit on 02/21/24  POC HgB A1c  Result Value Ref Range   Hemoglobin A1C 10.3 (A) 4.0 - 5.6 %   HbA1c POC (<> result, manual entry)     HbA1c, POC (prediabetic range)     HbA1c, POC (controlled diabetic range)        Assessment & Plan:  Type 2 diabetes mellitus with other specified complication, without long-term current use of  insulin  (HCC) -     Ambulatory  referral to Endocrinology -     POCT glycosylated hemoglobin (Hb A1C)  Essential hypertension  Morbid obesity (HCC)  Acquired hypothyroidism  Mixed hyperlipidemia  Dermatitis -     Ambulatory referral to Dermatology  Carpal tunnel syndrome of left wrist   Hemoglobin A1c 10.3% this visit.  Advised to pick up rx for Novolog  from pharmacy.  Discussed the importance of making lifestyle modifications including wt loss to better control bs.  Continue Novolog  70/30 64 units  with breakfast, 34 units with lunch, and 64 units with dinner.  Intolerances/Allergies to Canagliflozin, Dulaglutide , Insulin  Degludec and Metformin .  New endo referral placed.  Will have pt bring cgm to clinic later this wk for help putting on device.  Continue lipitor 10 mg, olmesartan -hydrochlorothiazide  40-25 mg daily.  New derm referral placed for skin changes of face.  Continue synthroid  75 mcg daily.  BP elevated.  Recheck.  Continued current meds.  Carpal tunnel surgery scheduled.  No follow-ups on file.    Viola Greulich, MD

## 2024-02-22 ENCOUNTER — Other Ambulatory Visit: Payer: Self-pay | Admitting: Internal Medicine

## 2024-02-22 ENCOUNTER — Other Ambulatory Visit: Payer: Self-pay | Admitting: Family Medicine

## 2024-02-22 ENCOUNTER — Ambulatory Visit

## 2024-02-22 DIAGNOSIS — E1169 Type 2 diabetes mellitus with other specified complication: Secondary | ICD-10-CM | POA: Diagnosis not present

## 2024-02-22 DIAGNOSIS — L309 Dermatitis, unspecified: Secondary | ICD-10-CM

## 2024-02-22 NOTE — Progress Notes (Signed)
 Patient is in office today for a nurse visit for Dexcom placement. Patient Injection was given in the  Left arm. Patient tolerated injection well.

## 2024-04-18 ENCOUNTER — Other Ambulatory Visit: Payer: Self-pay | Admitting: "Endocrinology

## 2024-04-18 NOTE — Telephone Encounter (Signed)
 Requested Prescriptions   Pending Prescriptions Disp Refills   Continuous Glucose Sensor (DEXCOM G7 SENSOR) MISC [Pharmacy Med Name: DEXCOM G7 SENSOR] 9 each 0    Sig: USE AS DIRECTED

## 2024-05-24 ENCOUNTER — Other Ambulatory Visit: Payer: Self-pay | Admitting: Family Medicine

## 2024-05-24 DIAGNOSIS — E039 Hypothyroidism, unspecified: Secondary | ICD-10-CM

## 2024-05-29 ENCOUNTER — Other Ambulatory Visit: Payer: Self-pay | Admitting: Family Medicine

## 2024-05-29 DIAGNOSIS — E039 Hypothyroidism, unspecified: Secondary | ICD-10-CM

## 2024-05-29 MED ORDER — LEVOTHYROXINE SODIUM 75 MCG PO TABS: 75.0000 ug | ORAL_TABLET | Freq: Every day | ORAL | 3 refills | Status: DC

## 2024-07-04 ENCOUNTER — Other Ambulatory Visit: Payer: Self-pay | Admitting: Family Medicine

## 2024-07-04 DIAGNOSIS — L309 Dermatitis, unspecified: Secondary | ICD-10-CM

## 2024-07-15 ENCOUNTER — Other Ambulatory Visit: Payer: Self-pay | Admitting: Family Medicine

## 2024-08-29 ENCOUNTER — Encounter: Payer: Self-pay | Admitting: Family Medicine

## 2024-08-29 ENCOUNTER — Ambulatory Visit: Admitting: Family Medicine

## 2024-08-29 VITALS — BP 136/84 | HR 85 | Temp 99.9°F | Ht 64.0 in | Wt 259.8 lb

## 2024-08-29 DIAGNOSIS — U071 COVID-19: Secondary | ICD-10-CM

## 2024-08-29 DIAGNOSIS — R0981 Nasal congestion: Secondary | ICD-10-CM

## 2024-08-29 DIAGNOSIS — R509 Fever, unspecified: Secondary | ICD-10-CM

## 2024-08-29 DIAGNOSIS — R051 Acute cough: Secondary | ICD-10-CM

## 2024-08-29 LAB — POCT INFLUENZA A/B
Influenza A, POC: NEGATIVE
Influenza B, POC: NEGATIVE

## 2024-08-29 LAB — POC COVID19 BINAXNOW: SARS Coronavirus 2 Ag: POSITIVE — AB

## 2024-08-29 MED ORDER — NIRMATRELVIR&RITONAVIR 300/100 20 X 150 MG & 10 X 100MG PO TBPK: 3.0000 | ORAL_TABLET | Freq: Two times a day (BID) | ORAL | 0 refills | Status: DC

## 2024-08-29 MED ORDER — ALBUTEROL SULFATE HFA 108 (90 BASE) MCG/ACT IN AERS: 2.0000 | INHALATION_SPRAY | Freq: Four times a day (QID) | RESPIRATORY_TRACT | 0 refills | Status: AC | PRN

## 2024-08-29 MED ORDER — NIRMATRELVIR&RITONAVIR 300/100 20 X 150 MG & 10 X 100MG PO TBPK: 3.0000 | ORAL_TABLET | Freq: Two times a day (BID) | ORAL | 0 refills | Status: AC

## 2024-08-29 MED ORDER — BENZONATATE 100 MG PO CAPS: 100.0000 mg | ORAL_CAPSULE | Freq: Two times a day (BID) | ORAL | 0 refills | Status: AC | PRN

## 2024-08-29 NOTE — Patient Instructions (Addendum)
 Stop taking the garlic oil tablets if you start taking Paxlovid.

## 2024-08-29 NOTE — Progress Notes (Signed)
 Established Patient Office Visit   Subjective  Patient ID: Jessica Pace, female    DOB: 08-07-1961  Age: 63 y.o. MRN: [id number]  Chief Complaint  Patient presents with   Acute Visit    Patient acme int oay for coughing,congestion, headaches, body aches, fever chills, upper back pain when coughing, yellow mucus with blood, started 3 days ago,     Pt is a 63 yo female seen for acute concern.  Pt endorses loss of taste, some wheezing, cough with blood tinged sputum, chills, subjective fever, body aches, HAs x 3 days.  Denies diarrhea, facial pain/pressure, ear pain/pressure.  Trying OTC meds. BS up and down between 89-250s.  Requesting refills on lipitor, synthroid , and novolog  70/30 flex pen.    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/2022   Aortic atherosclerosis 03/31/2022   Mixed incontinence 11/29/2021   History of cancer of uterine body 06/14/2021   Hypothyroidism 06/14/2021   Pulmonary nodule 06/14/2021   Elevated LFTs 09/13/2016   Diverticulitis 09/04/2016   Type 2 diabetes mellitus with morbid obesity (HCC) 11/02/2015   History of hypothyroidism 09/01/2015   Obesity (BMI 30-39.9) 08/10/2015   Diabetes type 2, uncontrolled 08/10/2015   Essential hypertension 08/10/2015   History of vitamin D  deficiency 08/10/2015   History of headache 08/10/2015   Mixed hyperlipidemia 02/28/2012   Vitamin D  deficiency 02/28/2012   Past Medical History:  Diagnosis Date   Allergy    Asthma    Complication of anesthesia    takes me a long time to come out (09/05/2016)   Diabetes mellitus without complication (HCC)    Hypertension    Pneumonia 1990's X 2; 2013   Thyroid disease    Type II diabetes mellitus (HCC)    Past Surgical History:  Procedure Laterality Date   ABDOMINAL HYSTERECTOMY  1998   CARPAL TUNNEL RELEASE Right ~ 2012   TUBAL LIGATION   1994   Social History   Tobacco Use   Smoking status: Never   Smokeless tobacco: Never  Vaping Use   Vaping status: Never Used  Substance Use Topics   Alcohol use: No   Drug use: No   Family History  Problem Relation Age of Onset   Diabetes Mother    Hypertension Mother    Obesity Mother    Hyperlipidemia Father    Hypertension Father    Hypertension Maternal Grandmother    Heart failure Maternal Grandmother    Colon cancer Neg Hx    Stomach cancer Neg Hx    Rectal cancer Neg Hx    Esophageal cancer Neg Hx    Liver cancer Neg Hx    Breast cancer Neg Hx    Allergies  Allergen Reactions   Canagliflozin     Other reaction(s): yeast   Dulaglutide  Other (See Comments)    Patient reports it caused flare of diverticulitis.   Insulin  Degludec Other (See Comments)   Metformin  Hcl Diarrhea and Other (See Comments)   Butyl Acetate (N-Butyl Acetate) Other (See Comments)    In paints and nail polish- unknown    ROS Negative unless stated above    Objective:     BP 136/84 (BP Location: Left Arm, Patient Position: Sitting, Cuff Size: Large)   Pulse 85   Temp 99.9 F (37.7 C) (Oral)   Ht 5' 4 (1.626 m)   Wt 259 lb 12.8  oz (117.8 kg)   SpO2 100%   BMI 44.59 kg/m  BP Readings from Last 3 Encounters:  08/29/24 136/84  02/21/24 (!) 142/86  01/11/24 110/70   Wt Readings from Last 3 Encounters:  08/29/24 259 lb 12.8 oz (117.8 kg)  02/21/24 243 lb 3.2 oz (110.3 kg)  01/11/24 251 lb (113.9 kg)      Physical Exam Constitutional:      General: She is not in acute distress.    Appearance: Normal appearance. She is ill-appearing.  HENT:     Head: Normocephalic and atraumatic.     Nose: Nose normal.     Mouth/Throat:     Mouth: Mucous membranes are moist.  Cardiovascular:     Rate and Rhythm: Normal rate and regular rhythm.     Heart sounds: Normal heart sounds. No murmur heard.    No gallop.  Pulmonary:     Effort: Pulmonary effort is normal. No respiratory  distress.     Breath sounds: Normal breath sounds. No wheezing, rhonchi or rales.     Comments: cough Skin:    General: Skin is warm and dry.  Neurological:     Mental Status: She is alert and oriented to person, place, and time.        11/23/2023    3:53 PM 08/16/2023    4:32 PM 10/21/2022    3:22 PM  Depression screen PHQ 2/9  Decreased Interest 0 0 0  Down, Depressed, Hopeless 0 0 0  PHQ - 2 Score 0 0 0  Altered sleeping 1 2 0  Tired, decreased energy 1 3 1   Change in appetite 0 2 1  Feeling bad or failure about yourself  0 0 0  Trouble concentrating 0 0 0  Moving slowly or fidgety/restless 0 0 0  Suicidal thoughts 0 0 0  PHQ-9 Score 2  7  2    Difficult doing work/chores Somewhat difficult Somewhat difficult      Data saved with a previous flowsheet row definition      11/23/2023    3:54 PM 08/16/2023    4:32 PM 10/21/2022    3:23 PM 10/06/2022    8:55 AM  GAD 7 : Generalized Anxiety Score  Nervous, Anxious, on Edge 0 0 0 0  Control/stop worrying 0 0 0 0  Worry too much - different things 0 0 0 0  Trouble relaxing 0 2 0 0  Restless 0 0 0 0  Easily annoyed or irritable 0 0 0 0  Afraid - awful might happen 0 0 0 0  Total GAD 7 Score 0 2 0 0  Anxiety Difficulty Not difficult at all        Results for orders placed or performed in visit on 08/29/24  POC COVID-19 BinaxNow  Result Value Ref Range   SARS Coronavirus 2 Ag Positive (A) Negative  POC Influenza A/B  Result Value Ref Range   Influenza A, POC Negative Negative   Influenza B, POC Negative Negative      Assessment & Plan:   COVID-19 virus infection -     Nirmatrelvir &Ritonavir  300/100; Take 3 tablets by mouth 2 (two) times daily for 5 days. Patient GFR is 68. Take nirmatrelvir  (150 mg) two tablets twice daily for 5 days and ritonavir  (100 mg) one tablet twice daily for 5 days.  Dispense: 30 tablet; Refill: 0 -     Albuterol  Sulfate HFA; Inhale 2 puffs into the lungs every 6 (six) hours as needed for  wheezing or shortness of breath.  Dispense: 8 g; Refill: 0  Sinus congestion -     POC COVID-19 BinaxNow -     POCT Influenza A/B  Acute cough -     POC COVID-19 BinaxNow -     POCT Influenza A/B -     Benzonatate ; Take 1 capsule (100 mg total) by mouth 2 (two) times daily as needed for cough.  Dispense: 20 capsule; Refill: 0  Fever, unspecified fever cause -     POC COVID-19 BinaxNow -     POCT Influenza A/B  Type 2 diabetes mellitus with morbid obesity (HCC) -     NovoLOG  Mix 70/30 FlexPen; INJECT 64 UNITS SUBCUTANEOUSLY WITH BREAKFAST, 34 UNITS WITH LUNCH AND 64 UNITS WITH DINNER  Dispense: 9 mL; Refill: 11  Acute URI symptoms.  POC COVID testing positive.  POC flu a/B negative.  Discussed r/b/a of antiviral medications including possible cost.  Rx for paxlovid  sent to pharmacy.  Advised to stop lipitor while taking.  Albuterol  inhaler sent given h/lo reactive airway dz and mild wheezing.  Monitor bs closely.  Supportive care including hydration, rest, tylenol  prn, etc. Given strict ED precautions.  Lipitor and synthroid  have refills remaining.  Contact pharmacy.  Novolog  refilled.  Continue lifestyle modifications.  Will have pt f/u for DM in the next few wks once feeling better.   Return in about 5 weeks (around 10/03/2024), or if symptoms worsen or fail to improve, for chronic conditions.   Clotilda JONELLE Single, MD

## 2024-09-22 MED ORDER — NOVOLOG MIX 70/30 FLEXPEN (70-30) 100 UNIT/ML ~~LOC~~ SUPN: PEN_INJECTOR | SUBCUTANEOUS | 11 refills | Status: DC

## 2024-10-03 ENCOUNTER — Encounter: Payer: Self-pay | Admitting: Family Medicine

## 2024-10-03 ENCOUNTER — Ambulatory Visit: Admitting: Family Medicine

## 2024-10-03 DIAGNOSIS — Z6841 Body Mass Index (BMI) 40.0 and over, adult: Secondary | ICD-10-CM | POA: Diagnosis not present

## 2024-10-03 DIAGNOSIS — Z780 Asymptomatic menopausal state: Secondary | ICD-10-CM

## 2024-10-03 DIAGNOSIS — E1169 Type 2 diabetes mellitus with other specified complication: Secondary | ICD-10-CM

## 2024-10-03 DIAGNOSIS — E039 Hypothyroidism, unspecified: Secondary | ICD-10-CM

## 2024-10-03 DIAGNOSIS — I10 Essential (primary) hypertension: Secondary | ICD-10-CM

## 2024-10-03 DIAGNOSIS — E782 Mixed hyperlipidemia: Secondary | ICD-10-CM | POA: Diagnosis not present

## 2024-10-03 DIAGNOSIS — Z7984 Long term (current) use of oral hypoglycemic drugs: Secondary | ICD-10-CM | POA: Diagnosis not present

## 2024-10-03 LAB — POCT GLYCOSYLATED HEMOGLOBIN (HGB A1C): Hemoglobin A1C: 7.7 % — AB (ref 4.0–5.6)

## 2024-10-03 MED ORDER — DEXCOM G7 SENSOR MISC: 1.0000 | 4 refills | Status: AC

## 2024-10-03 MED ORDER — NOVOLOG MIX 70/30 FLEXPEN (70-30) 100 UNIT/ML ~~LOC~~ SUPN: PEN_INJECTOR | SUBCUTANEOUS | 11 refills | Status: DC

## 2024-10-03 MED ORDER — ATORVASTATIN CALCIUM 10 MG PO TABS: 10.0000 mg | ORAL_TABLET | Freq: Every day | ORAL | 3 refills | Status: AC

## 2024-10-03 MED ORDER — TIRZEPATIDE 2.5 MG/0.5ML ~~LOC~~ SOAJ: 2.5000 mg | SUBCUTANEOUS | 3 refills | Status: AC

## 2024-10-03 MED ORDER — LEVOTHYROXINE SODIUM 75 MCG PO TABS: 75.0000 ug | ORAL_TABLET | Freq: Every day | ORAL | 3 refills | Status: AC

## 2024-10-03 NOTE — Patient Instructions (Addendum)
 We will see if your insurance will cover Mounjaro .  As you have had issues with diverticulitis in the past use it with caution.  Monitor for new symptoms.  We can schedule your bone density screening at the front desk before you leave.

## 2024-10-03 NOTE — Progress Notes (Signed)
 "  Established Patient Office Visit   Subjective  Patient ID: Jessica Pace, female    DOB: May 14, 1961  Age: 63 y.o. MRN: [id number]  Chief Complaint  Patient presents with   Medical Management of Chronic Issues    Patient came in today for 5 week follow-up for Diabetes and Blood pressure, obesity     Patient is a 63 year old female seen for follow-up on chronic conditions.  Patient requesting refills on several medications including atorvastatin , levothyroxine , NovoLog  70/30.  Patient working on making consistent diet changes and exercising.  Lost 5 pounds since last office visit.  Feeling good.  Had eye exam at [facility].    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/2022   Aortic atherosclerosis 03/31/2022   Mixed incontinence 11/29/2021   History of cancer of uterine body 06/14/2021   Hypothyroidism 06/14/2021   Pulmonary nodule 06/14/2021   Elevated LFTs 09/13/2016   Diverticulitis 09/04/2016   Type 2 diabetes mellitus with morbid obesity (HCC) 11/02/2015   History of hypothyroidism 09/01/2015   Obesity (BMI 30-39.9) 08/10/2015   Diabetes type 2, uncontrolled 08/10/2015   Essential hypertension 08/10/2015   History of vitamin D  deficiency 08/10/2015   History of headache 08/10/2015   Mixed hyperlipidemia 02/28/2012   Vitamin D  deficiency 02/28/2012   Past Medical History:  Diagnosis Date   Allergy    Asthma    Complication of anesthesia    takes me a long time to come out (09/05/2016)   Diabetes mellitus without complication (HCC)    Hypertension    Pneumonia 1990's X 2; 2013   Thyroid disease    Type II diabetes mellitus (HCC)    Past Surgical History:  Procedure Laterality Date   ABDOMINAL HYSTERECTOMY  1998   CARPAL TUNNEL RELEASE Right ~ 2012   TUBAL LIGATION  1994   Social History[1] Family History  Problem  Relation Age of Onset   Diabetes Mother    Hypertension Mother    Obesity Mother    Hyperlipidemia Father    Hypertension Father    Hypertension Maternal Grandmother    Heart failure Maternal Grandmother    Colon cancer Neg Hx    Stomach cancer Neg Hx    Rectal cancer Neg Hx    Esophageal cancer Neg Hx    Liver cancer Neg Hx    Breast cancer Neg Hx    Allergies[2]  ROS Negative unless stated above    Objective:     BP 132/84 (BP Location: Left Arm, Patient Position: Sitting, Cuff Size: Large)   Pulse 80   Temp 98.4 F (36.9 C) (Oral)   Ht 5' 4 (1.626 m)   Wt 255 lb 12.8 oz (116 kg)   SpO2 100%   BMI 43.91 kg/m  BP Readings from Last 3 Encounters:  10/03/24 132/84  08/29/24 136/84  02/21/24 (!) 142/86   Wt Readings from Last 3 Encounters:  10/03/24 255 lb 12.8 oz (116 kg)  08/29/24 259 lb 12.8 oz (117.8 kg)  02/21/24 243 lb 3.2 oz (110.3 kg)      Physical Exam Constitutional:      General: She is not in acute distress.    Appearance: Normal appearance.  HENT:     Head: Normocephalic and atraumatic.     Nose: Nose normal.     Mouth/Throat:     Mouth: Mucous membranes are  moist.  Cardiovascular:     Rate and Rhythm: Normal rate and regular rhythm.     Heart sounds: Normal heart sounds. No murmur heard.    No gallop.  Pulmonary:     Effort: Pulmonary effort is normal. No respiratory distress.     Breath sounds: Normal breath sounds. No wheezing, rhonchi or rales.  Skin:    General: Skin is warm and dry.  Neurological:     Mental Status: She is alert and oriented to person, place, and time.        10/03/2024    9:00 AM 11/23/2023    3:53 PM 08/16/2023    4:32 PM  Depression screen PHQ 2/9  Decreased Interest 0 0 0  Down, Depressed, Hopeless 0 0 0  PHQ - 2 Score 0 0 0  Altered sleeping 0 1 2  Tired, decreased energy 2 1 3   Change in appetite 1 0 2  Feeling bad or failure about yourself  0 0 0  Trouble concentrating 0 0 0  Moving slowly or  fidgety/restless 0 0 0  Suicidal thoughts 0 0 0  PHQ-9 Score 3 2  7    Difficult doing work/chores Not difficult at all Somewhat difficult Somewhat difficult     Data saved with a previous flowsheet row definition      10/03/2024    9:01 AM 11/23/2023    3:54 PM 08/16/2023    4:32 PM 10/21/2022    3:23 PM  GAD 7 : Generalized Anxiety Score  Nervous, Anxious, on Edge 0 0 0 0  Control/stop worrying 0 0 0 0  Worry too much - different things 0 0 0 0  Trouble relaxing 0 0 2 0  Restless 0 0 0 0  Easily annoyed or irritable 0 0 0 0  Afraid - awful might happen 0 0 0 0  Total GAD 7 Score 0 0 2 0  Anxiety Difficulty Not difficult at all Not difficult at all       Results for orders placed or performed in visit on 10/03/24  POC HgB A1c  Result Value Ref Range   Hemoglobin A1C 7.7 (A) 4.0 - 5.6 %   HbA1c POC (<> result, manual entry)     HbA1c, POC (prediabetic range)     HbA1c, POC (controlled diabetic range)        Assessment & Plan:   Type 2 diabetes mellitus with morbid obesity (HCC) -     NovoLOG  Mix 70/30 FlexPen; INJECT 64 UNITS SUBCUTANEOUSLY WITH BREAKFAST, 34 UNITS WITH LUNCH AND 64 UNITS WITH DINNER  Dispense: 9 mL; Refill: 11 -     POCT glycosylated hemoglobin (Hb A1C) -     Tirzepatide ; Inject 2.5 mg into the skin once a week.  Dispense: 2 mL; Refill: 3 -     Dexcom G7 Sensor; Inject 1 Device into the skin See admin instructions.  Dispense: 9 each; Refill: 4  Mixed hyperlipidemia -     Atorvastatin  Calcium ; Take 1 tablet (10 mg total) by mouth daily.  Dispense: 90 tablet; Refill: 3  Acquired hypothyroidism -     Levothyroxine  Sodium; Take 1 tablet (75 mcg total) by mouth daily before breakfast.  Dispense: 90 tablet; Refill: 3  Essential hypertension  Morbid obesity (HCC)  Asymptomatic postmenopausal state -     DG Bone Density; Future  Morbid obesity Body mass index is 43.91 kg/m, with comorbidities of DM2, HLD, HTN.  DM2 previously uncontrolled,A1c 10.3% on  02/21/2024.  A1c  this visit 7.7%.  Will start Mounjaro  2.5 mg weekly to aid with blood sugar and weight loss if covered by insurance.  Will have patient decrease NovoLog  70/30 insulin  to 32, 16, 32 upon r starting mounjaro .  Foot exam up-to-date done 11/23/2023.  Eye exam up-to-date, [facility] on [address].  Continue atorvastatin  10 mg daily.  BP controlled.  Continue olmesartan -hydrochlorothiazide  40-25 mg daily.  Bone density ordered.  Return in about 6 weeks (around 11/14/2024) for chronic conditions.   Clotilda JONELLE Single, MD      [1]  Social History Tobacco Use   Smoking status: Never   Smokeless tobacco: Never  Vaping Use   Vaping status: Never Used  Substance Use Topics   Alcohol use: No   Drug use: No  [2]  Allergies Allergen Reactions   Canagliflozin     Other reaction(s): yeast   Dulaglutide  Other (See Comments)    Patient reports it caused flare of diverticulitis.   Insulin  Degludec Other (See Comments)   Metformin  Hcl Diarrhea and Other (See Comments)   Butyl Acetate (N-Butyl Acetate) Other (See Comments)    In paints and nail polish- unknown   "

## 2024-10-04 ENCOUNTER — Inpatient Hospital Stay: Admission: RE | Admit: 2024-10-04 | Source: Ambulatory Visit

## 2024-10-04 MED ORDER — NOVOLOG MIX 70/30 FLEXPEN (70-30) 100 UNIT/ML ~~LOC~~ SUPN: PEN_INJECTOR | SUBCUTANEOUS | 11 refills | Status: AC

## 2024-11-20 ENCOUNTER — Other Ambulatory Visit: Payer: Self-pay | Admitting: Family Medicine

## 2024-11-20 DIAGNOSIS — N3946 Mixed incontinence: Secondary | ICD-10-CM

## 2024-11-27 ENCOUNTER — Encounter: Admitting: Family Medicine
